# Patient Record
Sex: Female | Born: 1965
Health system: Southern US, Community
[De-identification: ages and names within clinical notes are randomized; demographics above are authoritative.]

## PROBLEM LIST (undated history)

## (undated) DIAGNOSIS — Z9889 Other specified postprocedural states: Secondary | ICD-10-CM

## (undated) DIAGNOSIS — G709 Myoneural disorder, unspecified: Secondary | ICD-10-CM

## (undated) DIAGNOSIS — F32A Depression, unspecified: Secondary | ICD-10-CM

## (undated) DIAGNOSIS — F419 Anxiety disorder, unspecified: Secondary | ICD-10-CM

## (undated) DIAGNOSIS — M1990 Unspecified osteoarthritis, unspecified site: Secondary | ICD-10-CM

## (undated) DIAGNOSIS — E079 Disorder of thyroid, unspecified: Secondary | ICD-10-CM

## (undated) DIAGNOSIS — R928 Other abnormal and inconclusive findings on diagnostic imaging of breast: Secondary | ICD-10-CM

## (undated) DIAGNOSIS — J45909 Unspecified asthma, uncomplicated: Secondary | ICD-10-CM

## (undated) DIAGNOSIS — I8390 Asymptomatic varicose veins of unspecified lower extremity: Secondary | ICD-10-CM

## (undated) DIAGNOSIS — I890 Lymphedema, not elsewhere classified: Secondary | ICD-10-CM

## (undated) DIAGNOSIS — K219 Gastro-esophageal reflux disease without esophagitis: Secondary | ICD-10-CM

## (undated) DIAGNOSIS — G4730 Sleep apnea, unspecified: Secondary | ICD-10-CM

## (undated) DIAGNOSIS — E063 Autoimmune thyroiditis: Secondary | ICD-10-CM

## (undated) DIAGNOSIS — E039 Hypothyroidism, unspecified: Secondary | ICD-10-CM

## (undated) DIAGNOSIS — J189 Pneumonia, unspecified organism: Secondary | ICD-10-CM

## (undated) DIAGNOSIS — R011 Cardiac murmur, unspecified: Secondary | ICD-10-CM

## (undated) DIAGNOSIS — E785 Hyperlipidemia, unspecified: Secondary | ICD-10-CM

## (undated) DIAGNOSIS — R87619 Unspecified abnormal cytological findings in specimens from cervix uteri: Secondary | ICD-10-CM

## (undated) DIAGNOSIS — R112 Nausea with vomiting, unspecified: Secondary | ICD-10-CM

## (undated) HISTORY — DX: Unspecified asthma, uncomplicated: J45.909

## (undated) HISTORY — DX: Gastro-esophageal reflux disease without esophagitis: K21.9

## (undated) HISTORY — DX: Depression, unspecified: F32.A

## (undated) HISTORY — DX: Autoimmune thyroiditis: E06.3

## (undated) HISTORY — DX: Disorder of thyroid, unspecified: E07.9

## (undated) HISTORY — DX: Hyperlipidemia, unspecified: E78.5

## (undated) HISTORY — DX: Sleep apnea, unspecified: G47.30

## (undated) HISTORY — PX: BLADDER SUSPENSION: SHX72

## (undated) HISTORY — DX: Other specified postprocedural states: R11.2

## (undated) HISTORY — DX: Unspecified osteoarthritis, unspecified site: M19.90

## (undated) HISTORY — PX: PARTIAL HYSTERECTOMY: SHX80

## (undated) HISTORY — DX: Unspecified abnormal cytological findings in specimens from cervix uteri: R87.619

## (undated) HISTORY — DX: Other specified postprocedural states: Z98.890

## (undated) HISTORY — DX: Cardiac murmur, unspecified: R01.1

## (undated) HISTORY — PX: SKIN GRAFT: SHX250

## (undated) HISTORY — DX: Anxiety disorder, unspecified: F41.9

## (undated) HISTORY — PX: TUBAL LIGATION: SHX77

## (undated) HISTORY — PX: INCISION AND DRAINAGE OF WOUND: SHX1803

## (undated) HISTORY — PX: CHOLECYSTECTOMY OPEN: SUR202

## (undated) HISTORY — DX: Other abnormal and inconclusive findings on diagnostic imaging of breast: R92.8

## (undated) HISTORY — DX: Myoneural disorder, unspecified: G70.9

## (undated) HISTORY — PX: APPLICATION OF WOUND VAC: SHX5189

## (undated) HISTORY — PX: ABDOMINAL HYSTERECTOMY: SHX81

---

## 2007-05-22 ENCOUNTER — Ambulatory Visit: Payer: Self-pay | Admitting: Family Medicine

## 2007-05-22 DIAGNOSIS — R350 Frequency of micturition: Secondary | ICD-10-CM | POA: Insufficient documentation

## 2007-05-22 DIAGNOSIS — E039 Hypothyroidism, unspecified: Secondary | ICD-10-CM | POA: Insufficient documentation

## 2007-05-22 DIAGNOSIS — M2550 Pain in unspecified joint: Secondary | ICD-10-CM | POA: Insufficient documentation

## 2007-05-22 DIAGNOSIS — R609 Edema, unspecified: Secondary | ICD-10-CM | POA: Insufficient documentation

## 2007-05-26 ENCOUNTER — Encounter: Payer: Self-pay | Admitting: Family Medicine

## 2007-05-26 LAB — CONVERTED CEMR LAB
ALT: 13 units/L (ref 0–35)
AST: 14 units/L (ref 0–37)
Alkaline Phosphatase: 88 units/L (ref 39–117)
CO2: 20 meq/L (ref 19–32)
Creatinine, Ser: 0.88 mg/dL (ref 0.40–1.20)
Pro B Natriuretic peptide (BNP): 27 pg/mL (ref 0.0–100.0)
Sed Rate: 4 mm/hr (ref 0–22)
Sodium: 140 meq/L (ref 135–145)
Total Bilirubin: 0.5 mg/dL (ref 0.3–1.2)
Total Protein: 7.1 g/dL (ref 6.0–8.3)

## 2007-05-27 ENCOUNTER — Telehealth: Payer: Self-pay | Admitting: Family Medicine

## 2007-06-02 ENCOUNTER — Telehealth (INDEPENDENT_AMBULATORY_CARE_PROVIDER_SITE_OTHER): Payer: Self-pay | Admitting: *Deleted

## 2007-08-08 ENCOUNTER — Ambulatory Visit: Payer: Self-pay | Admitting: Family Medicine

## 2007-08-08 DIAGNOSIS — B029 Zoster without complications: Secondary | ICD-10-CM | POA: Insufficient documentation

## 2007-08-12 ENCOUNTER — Encounter: Payer: Self-pay | Admitting: Family Medicine

## 2007-08-12 ENCOUNTER — Telehealth (INDEPENDENT_AMBULATORY_CARE_PROVIDER_SITE_OTHER): Payer: Self-pay | Admitting: *Deleted

## 2007-08-13 ENCOUNTER — Encounter: Payer: Self-pay | Admitting: Family Medicine

## 2007-08-13 LAB — CONVERTED CEMR LAB
TSH: 20.036 microintl units/mL — ABNORMAL HIGH (ref 0.350–5.50)
Varicella IgG: 3.58 — ABNORMAL HIGH

## 2007-08-18 LAB — CONVERTED CEMR LAB: Varicella-Zoster Ab, IgM: <0.91 (ref <0.91)

## 2007-08-19 ENCOUNTER — Encounter: Payer: Self-pay | Admitting: Family Medicine

## 2011-08-22 DIAGNOSIS — Z8719 Personal history of other diseases of the digestive system: Secondary | ICD-10-CM | POA: Insufficient documentation

## 2011-08-22 DIAGNOSIS — F32A Depression, unspecified: Secondary | ICD-10-CM | POA: Insufficient documentation

## 2011-09-20 DIAGNOSIS — J45909 Unspecified asthma, uncomplicated: Secondary | ICD-10-CM | POA: Insufficient documentation

## 2013-08-03 DIAGNOSIS — N393 Stress incontinence (female) (male): Secondary | ICD-10-CM | POA: Insufficient documentation

## 2013-08-03 DIAGNOSIS — IMO0002 Reserved for concepts with insufficient information to code with codable children: Secondary | ICD-10-CM | POA: Insufficient documentation

## 2013-11-06 DIAGNOSIS — N819 Female genital prolapse, unspecified: Secondary | ICD-10-CM | POA: Insufficient documentation

## 2014-11-12 DIAGNOSIS — R600 Localized edema: Secondary | ICD-10-CM | POA: Insufficient documentation

## 2014-11-12 DIAGNOSIS — I872 Venous insufficiency (chronic) (peripheral): Secondary | ICD-10-CM | POA: Insufficient documentation

## 2014-11-12 DIAGNOSIS — I890 Lymphedema, not elsewhere classified: Secondary | ICD-10-CM | POA: Insufficient documentation

## 2014-11-12 DIAGNOSIS — M7989 Other specified soft tissue disorders: Secondary | ICD-10-CM | POA: Insufficient documentation

## 2014-11-12 DIAGNOSIS — Z8249 Family history of ischemic heart disease and other diseases of the circulatory system: Secondary | ICD-10-CM | POA: Insufficient documentation

## 2015-07-26 DIAGNOSIS — M7711 Lateral epicondylitis, right elbow: Secondary | ICD-10-CM | POA: Insufficient documentation

## 2015-08-02 ENCOUNTER — Emergency Department
Admission: EM | Admit: 2015-08-02 | Discharge: 2015-08-02 | Disposition: A | Discharge status: Home / Self Care | Payer: BLUE CROSS/BLUE SHIELD | Source: Home / Self Care | Attending: Family Medicine | Admitting: Family Medicine

## 2015-08-02 ENCOUNTER — Encounter: Payer: Self-pay | Admitting: *Deleted

## 2015-08-02 DIAGNOSIS — I868 Varicose veins of other specified sites: Secondary | ICD-10-CM

## 2015-08-02 DIAGNOSIS — I8390 Asymptomatic varicose veins of unspecified lower extremity: Secondary | ICD-10-CM

## 2015-08-02 DIAGNOSIS — Z5189 Encounter for other specified aftercare: Secondary | ICD-10-CM

## 2015-08-02 DIAGNOSIS — M25571 Pain in right ankle and joints of right foot: Secondary | ICD-10-CM | POA: Insufficient documentation

## 2015-08-02 HISTORY — DX: Asymptomatic varicose veins of unspecified lower extremity: I83.90

## 2015-08-02 HISTORY — DX: Lymphedema, not elsewhere classified: I89.0

## 2015-08-02 NOTE — ED Provider Notes (Signed)
CSN: [id number]     Arrival date & time 08/02/15  1611 History   First MD Initiated Contact with Patient 08/02/15 1618     Chief Complaint  Patient presents with  . Abrasion   (Consider location/radiation/quality/duration/timing/severity/associated sxs/prior Treatment) HPI  Pt is a 50yo female sent to [facility] by her orthopedist for further evaluation and treatment of an abrasion that keeps on bleeding.  Pt states she accidentally nicked a varicose vein while shaving her legs this morning around 11:30AM.  Pt states she applied a pressure bandages but then had to go to a previously scheduled appointment with her orthopedist for Right ankle pain.  During the visit, her orthopedist removed the bandage and blood "squirted" out at the physician.  The nurse then applied a new pressure bandage and pt was told to go to emergency department for evaluation and treatment.  Pt notes she does take  aspirin daily but is not on any other daily medications.  Denies pain at this time.  Past Medical History  Diagnosis Date  . Lymphedema   . Varicose veins    Past Surgical History  Procedure Laterality Date  . Partial hysterectomy    . Incision and drainage of wound    . Skin graft    . Bladder suspension    . Cesarean section    . Cholecystectomy open     Family History  Problem Relation Age of Onset  . Stroke Mother    Social History  Substance Use Topics  . Smoking status: Never Smoker   . Smokeless tobacco: None  . Alcohol Use: No   OB History    No data available     Review of Systems  Musculoskeletal: Negative for myalgias and arthralgias.  Skin: Positive for wound. Negative for color change and rash.    Allergies  Hydrocodone-acetaminophen  Home Medications   Prior to Admission medications   Medication Sig Start Date End Date Taking? Authorizing Provider  citalopram (CELEXA) 40 MG tablet Take 40 mg by mouth daily.   Yes Historical Provider, MD  diclofenac (VOLTAREN) 75 MG EC  tablet Take 75 mg by mouth 2 (two) times daily.   Yes Historical Provider, MD  ferrous sulfate 325 (65 FE) MG tablet Take 325 mg by mouth daily with breakfast.   Yes Historical Provider, MD  gabapentin (NEURONTIN) 300 MG capsule Take 300 mg by mouth 3 (three) times daily.   Yes Historical Provider, MD  levothyroxine (SYNTHROID, LEVOTHROID) 200 MCG tablet Take 200 mcg by mouth daily before breakfast.   Yes Historical Provider, MD  pantoprazole (PROTONIX) 20 MG tablet Take 20 mg by mouth daily.   Yes Historical Provider, MD  tolterodine (DETROL LA) 4 MG 24 hr capsule Take 4 mg by mouth daily.   Yes Historical Provider, MD   Meds Ordered and Administered this Visit  Medications - No data to display  BP 107/73 mmHg  Pulse 70  Temp(Src) 97.8 F (36.6 C) (Oral)  Resp 18  Ht  (1.702 m)  Wt 289 lb (131.09 kg)  BMI 45.25 kg/m2  SpO2 96% No data found.   Physical Exam  Constitutional: She is oriented to person, place, and time. She appears well-developed and well-nourished.  HENT:  Head: Normocephalic and atraumatic.  Eyes: EOM are normal.  Neck: Normal range of motion.  Cardiovascular: Normal rate.   Pulses:      Dorsalis pedis pulses are 2+ on the right side.  Pulmonary/Chest: Effort normal.  Musculoskeletal: Normal range  of motion.  Neurological: She is alert and oriented to person, place, and time.  Skin: Skin is warm and dry. No erythema.  Right ankle: multiple easily visible varicose veins.  Scant red blood on 0.5cm abrasion. No active bleeding. No foreign bodies seen or palpated. No red streaking.  Psychiatric: She has a normal mood and affect. Her behavior is normal.  Nursing note and vitals reviewed.   ED Course  Procedures (including critical care time)  Labs Review Labs Reviewed - No data to display  Imaging Review No results found.    MDM   1. Visit for wound check   2. Varicose veins     Pt advised to go to emergency department for evaluation of  abrasion on Right lower leg that wouldn't stop bleeding.  Pt came to [facility] for evaluation.  No active bleeding on exam, even with gentle manipulation of area during exam.   Reassured pt no additional treatment needed at this time including no indication for sutures, quick clot, or cautery.  Pressure bandage reapplied. Encouraged to keep elevated when she gets home and may apply ice pack. F/u with PCP this week as needed. Patient verbalized understanding and agreement with treatment plan.     Junius Finner, PA-C 08/02/15 1652

## 2015-08-02 NOTE — ED Notes (Signed)
Pt reports that she was shaving her legs this morning when she cut her RT ankle area with the razor. She applied pressure, but the abrasion is continuing to bleed.

## 2019-04-15 DIAGNOSIS — G4733 Obstructive sleep apnea (adult) (pediatric): Secondary | ICD-10-CM | POA: Insufficient documentation

## 2019-04-15 DIAGNOSIS — G4730 Sleep apnea, unspecified: Secondary | ICD-10-CM | POA: Insufficient documentation

## 2020-10-07 ENCOUNTER — Encounter: Payer: Self-pay | Admitting: Medical-Surgical

## 2020-10-07 ENCOUNTER — Ambulatory Visit (INDEPENDENT_AMBULATORY_CARE_PROVIDER_SITE_OTHER): Payer: Medicare Other | Admitting: Medical-Surgical

## 2020-10-07 ENCOUNTER — Other Ambulatory Visit: Payer: Self-pay

## 2020-10-07 VITALS — BP 112/69 | HR 58 | Temp 98.0°F | Ht 64.0 in | Wt 269.7 lb

## 2020-10-07 DIAGNOSIS — G2581 Restless legs syndrome: Secondary | ICD-10-CM | POA: Diagnosis not present

## 2020-10-07 DIAGNOSIS — M2550 Pain in unspecified joint: Secondary | ICD-10-CM | POA: Diagnosis not present

## 2020-10-07 DIAGNOSIS — E559 Vitamin D deficiency, unspecified: Secondary | ICD-10-CM

## 2020-10-07 DIAGNOSIS — E038 Other specified hypothyroidism: Secondary | ICD-10-CM | POA: Diagnosis not present

## 2020-10-07 DIAGNOSIS — Z114 Encounter for screening for human immunodeficiency virus [HIV]: Secondary | ICD-10-CM

## 2020-10-07 DIAGNOSIS — E063 Autoimmune thyroiditis: Secondary | ICD-10-CM

## 2020-10-07 DIAGNOSIS — Z1231 Encounter for screening mammogram for malignant neoplasm of breast: Secondary | ICD-10-CM | POA: Diagnosis not present

## 2020-10-07 DIAGNOSIS — Z1211 Encounter for screening for malignant neoplasm of colon: Secondary | ICD-10-CM | POA: Diagnosis not present

## 2020-10-07 DIAGNOSIS — Z0000 Encounter for general adult medical examination without abnormal findings: Secondary | ICD-10-CM | POA: Diagnosis not present

## 2020-10-07 DIAGNOSIS — Z7689 Persons encountering health services in other specified circumstances: Secondary | ICD-10-CM

## 2020-10-07 DIAGNOSIS — F324 Major depressive disorder, single episode, in partial remission: Secondary | ICD-10-CM

## 2020-10-07 DIAGNOSIS — Z1159 Encounter for screening for other viral diseases: Secondary | ICD-10-CM

## 2020-10-07 DIAGNOSIS — G609 Hereditary and idiopathic neuropathy, unspecified: Secondary | ICD-10-CM

## 2020-10-07 DIAGNOSIS — I890 Lymphedema, not elsewhere classified: Secondary | ICD-10-CM

## 2020-10-07 MED ORDER — GABAPENTIN 100 MG PO CAPS: 100.0000 mg | ORAL_CAPSULE | Freq: Every day | ORAL | 90 refills | Status: DC

## 2020-10-07 MED ORDER — CITALOPRAM HYDROBROMIDE 40 MG PO TABS: 40.0000 mg | ORAL_TABLET | Freq: Every day | ORAL | 3 refills | Status: DC

## 2020-10-07 MED ORDER — GABAPENTIN 300 MG PO CAPS: 300.0000 mg | ORAL_CAPSULE | Freq: Every evening | ORAL | 3 refills | Status: DC

## 2020-10-07 MED ORDER — TRAMADOL HCL 50 MG PO TABS: 50.0000 mg | ORAL_TABLET | Freq: Four times a day (QID) | ORAL | 0 refills | Status: DC | PRN

## 2020-10-07 MED ORDER — ALBUTEROL SULFATE HFA 108 (90 BASE) MCG/ACT IN AERS
2.0000 | INHALATION_SPRAY | RESPIRATORY_TRACT | 11 refills | Status: DC | PRN
Start: 2020-10-07 — End: 2021-07-07

## 2020-10-07 MED ORDER — MELOXICAM 15 MG PO TABS: 15.0000 mg | ORAL_TABLET | Freq: Every day | ORAL | 3 refills | Status: DC

## 2020-10-07 MED ORDER — PANTOPRAZOLE SODIUM 40 MG PO TBEC: 40.0000 mg | DELAYED_RELEASE_TABLET | Freq: Every day | ORAL | 3 refills | Status: DC

## 2020-10-07 NOTE — Progress Notes (Signed)
New Patient Office Visit  Subjective:  Patient ID: Barbara Calderon, female    DOB: 04-21-1966  Age: 55 y.o. MRN: [id number]  CC:  Chief Complaint  Patient presents with  . Establish Care    HPI Barbara Calderon presents to establish care.  Hypothyroidism-history of Hashimoto's thyroiditis, currently treated with levothyroxine 150 mg 6 days/week with one half tab on the seventh day.  Has noted that her nails are getting brittle which is usually the first sign that her thyroid levels are abnormal.  Depression-had depression after her father passed and was started on Celexa 40 mg daily.  She is tolerating this medication well without side effects.  Feels that her symptoms are well controlled.  Restless leg syndrome/peripheral neuropathy-taking gabapentin 100 mg every morning with 300 mg at night.  Reports this is helping her symptoms and they are well managed.  Joint pain-has chronic joint pains of her knees, elbows, and wrist.  Notes that her left arm and right leg are worse than anything else.  Uses gabapentin, Tylenol, and meloxicam.  Takes tramadol very sparingly and only for severe pain.  Admits to taking ibuprofen in conjunction with meloxicam but was never told to avoid taking these together.  Lymphedema-she does have compression socks/stockings but is inconsistent with wearing them at times.  She did leave them off today so that her legs could be evaluated.  She has furosemide 20 mg daily as needed and when she does take it, she takes p.o. potassium with it.  Vitamin D deficiency-taking a daily vitamin D supplement.  Asthma-reports that she has a history of what was determined to be asthma.  She uses an albuterol inhaler as needed, more often when allergies and environmental triggers are prevalent.  Past Medical History:  Diagnosis Date  . Abnormal mammogram   . Abnormal Pap smear of cervix   . Asthma   . Depression   . Lymphedema   . Thyroid disease   . Varicose veins      Past Surgical History:  Procedure Laterality Date  . ABDOMINAL HYSTERECTOMY     partial  . APPLICATION OF WOUND VAC    . BLADDER SUSPENSION    . CESAREAN SECTION    . CHOLECYSTECTOMY OPEN    . INCISION AND DRAINAGE OF WOUND    . PARTIAL HYSTERECTOMY    . SKIN GRAFT    . TUBAL LIGATION      Family History  Problem Relation Age of Onset  . Stroke Mother   . Hypertension Mother   . Prostate cancer Father     Social History   Socioeconomic History  . Marital status: Married    Spouse name: Not on file  . Number of children: Not on file  . Years of education: Not on file  . Highest education level: Not on file  Occupational History  . Not on file  Tobacco Use  . Smoking status: Never Smoker  . Smokeless tobacco: Never Used  Substance and Sexual Activity  . Alcohol use: Yes    Comment: occasionally  . Drug use: No  . Sexual activity: Yes    Partners: Male    Birth control/protection: Surgical  Other Topics Concern  . Not on file  Social History Narrative  . Not on file   Social Determinants of Health   Financial Resource Strain: Not on file  Food Insecurity: Not on file  Transportation Needs: Not on file  Physical Activity: Not on file  Stress: Not on  file  Social Connections: Not on file  Intimate Partner Violence: Not on file    ROS Review of Systems  Constitutional: Positive for unexpected weight change. Negative for chills, fatigue and fever.  HENT: Positive for congestion and hearing loss.   Eyes: Positive for visual disturbance.  Respiratory: Positive for cough. Negative for chest tightness, shortness of breath and wheezing.   Cardiovascular: Positive for palpitations and leg swelling. Negative for chest pain.  Gastrointestinal: Positive for abdominal pain (Heartburn). Negative for constipation, diarrhea, nausea and vomiting.  Endocrine: Positive for cold intolerance, heat intolerance, polydipsia and polyphagia.  Genitourinary:       Leaking  urine  Musculoskeletal: Positive for arthralgias.  Neurological: Positive for weakness and numbness. Negative for dizziness, light-headedness and headaches.  Hematological: Bruises/bleeds easily.  Psychiatric/Behavioral: Positive for dysphoric mood. Negative for self-injury, sleep disturbance and suicidal ideas. The patient is not nervous/anxious.     Objective:   Today's Vitals: BP 112/69   Pulse (!) 58   Temp 98 F (36.7 C)   Ht 5\' 4"  (1.626 m)   Wt 269 lb 11.2 oz (122.3 kg)   SpO2 97%   BMI 46.29 kg/m   Physical Exam Vitals reviewed.  Constitutional:      General: She is not in acute distress.    Appearance: Normal appearance.  HENT:     Head: Normocephalic and atraumatic.  Cardiovascular:     Rate and Rhythm: Normal rate and regular rhythm.     Pulses: Normal pulses.     Heart sounds: Normal heart sounds. No murmur heard. No friction rub. No gallop.   Pulmonary:     Effort: Pulmonary effort is normal. No respiratory distress.     Breath sounds: Normal breath sounds. No wheezing.  Musculoskeletal:     Right lower leg: Edema present.     Left lower leg: Edema present.  Skin:    General: Skin is warm and dry.  Neurological:     Mental Status: She is alert and oriented to person, place, and time.  Psychiatric:        Mood and Affect: Mood normal.        Behavior: Behavior normal.        Thought Content: Thought content normal.        Judgment: Judgment normal.     Assessment & Plan:   1. Encounter to establish care Reviewed available information and discussed healthcare concerns with patient.  2. Hypothyroidism due to Hashimoto's thyroiditis Checking TSH today.  For now continue levothyroxine 150 mg 6 days/week with 75 mg once a day.  Refills will depend on results and potential dose change. - TSH  3. Vitamin D deficiency Checking vitamin D.  Continue daily vitamin D dosing. - Vitamin D 1,25 dihydroxy  4. Lymphedema Strongly recommend wearing compression  socks/stockings on a daily basis.  Okay to use furosemide and potassium chloride on an as-needed basis.  Monitor dietary sodium and fluid intake.  5. Polyarthralgia Continue gabapentin, meloxicam, and Tylenol as prescribed.  Avoid buprenorphine products while taking meloxicam.  Okay to use tramadol 50 mg tablets very sparingly for severe pain.  6. Restless leg syndrome 7. Peripheral neuropathy, idiopathic Well-controlled with gabapentin at current doses.  8. Depression, major, single episode, in partial remission (HCC) Well-controlled.  Continue Celexa 40 mg daily.  9. Preventative health care Checking CBC with differential, CMP, and lipid panel today. - CBC with Differential/Platelet - COMPLETE METABOLIC PANEL WITH GFR - Lipid panel  10. Screen for  colon cancer Referring to GI for colonoscopy. - Ambulatory referral to Gastroenterology  11. Encounter for screening mammogram for malignant neoplasm of breast Mammogram ordered. - MM DIGITAL SCREENING BILATERAL; Future   Outpatient Encounter Medications as of 10/07/2020  Medication Sig  . aspirin EC 81 MG tablet Take 81 mg by mouth daily. Swallow whole.  . Cholecalciferol (VITAMIN D3) 1.25 MG (50000 UT) CAPS Take 1 capsule by mouth daily.  . furosemide (LASIX) 20 MG tablet Take 1 tablet by mouth daily.  . iron polysaccharides (NIFEREX) 150 MG capsule Take 1 capsule by mouth daily.  Marland Kitchen levothyroxine (SYNTHROID) 150 MCG tablet Take 1 tablet by mouth every morning.  . potassium chloride SA (KLOR-CON) 20 MEQ tablet Take 1 tablet by mouth daily as needed. ONLY ON THE DAYS OF TAKING LASIX  . [DISCONTINUED] albuterol (VENTOLIN HFA) 108 (90 Base) MCG/ACT inhaler Inhale 2 puffs into the lungs every 4 (four) hours as needed for wheezing.  . [DISCONTINUED] citalopram (CELEXA) 40 MG tablet Take 1 tablet by mouth daily.  . [DISCONTINUED] gabapentin (NEURONTIN) 100 MG capsule Take 1 capsule by mouth daily.  . [DISCONTINUED] gabapentin (NEURONTIN)  300 MG capsule Take 1 capsule by mouth every evening.  . [DISCONTINUED] meloxicam (MOBIC) 15 MG tablet Take 1 tablet by mouth daily.  . [DISCONTINUED] pantoprazole (PROTONIX) 40 MG tablet Take 1 tablet by mouth daily.  . [DISCONTINUED] traMADol (ULTRAM) 50 MG tablet Take 1 tablet by mouth every 6 (six) hours as needed.  Marland Kitchen albuterol (VENTOLIN HFA) 108 (90 Base) MCG/ACT inhaler Inhale 2 puffs into the lungs every 4 (four) hours as needed for wheezing.  . citalopram (CELEXA) 40 MG tablet Take 1 tablet (40 mg total) by mouth daily.  Marland Kitchen gabapentin (NEURONTIN) 100 MG capsule Take 1 capsule (100 mg total) by mouth daily.  Marland Kitchen gabapentin (NEURONTIN) 300 MG capsule Take 1 capsule (300 mg total) by mouth every evening.  . meloxicam (MOBIC) 15 MG tablet Take 1 tablet (15 mg total) by mouth daily.  . pantoprazole (PROTONIX) 40 MG tablet Take 1 tablet (40 mg total) by mouth daily.  . traMADol (ULTRAM) 50 MG tablet Take 1 tablet (50 mg total) by mouth every 6 (six) hours as needed.  . [DISCONTINUED] citalopram (CELEXA) 40 MG tablet Take 40 mg by mouth daily.  . [DISCONTINUED] diclofenac (VOLTAREN) 75 MG EC tablet Take 75 mg by mouth 2 (two) times daily.  . [DISCONTINUED] ferrous sulfate 325 (65 FE) MG tablet Take 325 mg by mouth daily with breakfast.  . [DISCONTINUED] gabapentin (NEURONTIN) 300 MG capsule Take 300 mg by mouth 3 (three) times daily.  . [DISCONTINUED] levothyroxine (SYNTHROID, LEVOTHROID) 200 MCG tablet Take 200 mcg by mouth daily before breakfast.  . [DISCONTINUED] pantoprazole (PROTONIX) 20 MG tablet Take 20 mg by mouth daily.  . [DISCONTINUED] tolterodine (DETROL LA) 4 MG 24 hr capsule Take 4 mg by mouth daily.   No facility-administered encounter medications on file as of 10/07/2020.    Follow-up: Return in about 6 months (around 04/08/2021) for chronic disease follow up.   Thayer Ohm, DNP, APRN, FNP-BC [facility]

## 2020-10-10 MED ORDER — LEVOTHYROXINE SODIUM 137 MCG PO TABS: 137.0000 ug | ORAL_TABLET | Freq: Every day | ORAL | 1 refills | Status: DC

## 2020-10-10 NOTE — Addendum Note (Signed)
Addended byChristen Butter on: 10/10/2020 05:08 PM   Modules accepted: Orders

## 2020-10-12 LAB — COMPLETE METABOLIC PANEL WITH GFR
AG Ratio: 1.6 (calc) (ref 1.0–2.5)
ALT: 12 U/L (ref 6–29)
AST: 14 U/L (ref 10–35)
Albumin: 4.4 g/dL (ref 3.6–5.1)
Alkaline phosphatase (APISO): 82 U/L (ref 37–153)
BUN: 22 mg/dL (ref 7–25)
CO2: 24 mmol/L (ref 20–32)
Calcium: 9.5 mg/dL (ref 8.6–10.4)
Chloride: 105 mmol/L (ref 98–110)
Creat: 0.78 mg/dL (ref 0.50–1.05)
GFR, Est African American: 99 mL/min/{1.73_m2} (ref >=60)
GFR, Est Non African American: 86 mL/min/{1.73_m2} (ref >=60)
Globulin: 2.7 g/dL (calc) (ref 1.9–3.7)
Glucose, Bld: 92 mg/dL (ref 65–99)
Potassium: 4.2 mmol/L (ref 3.5–5.3)
Sodium: 140 mmol/L (ref 135–146)
Total Bilirubin: 0.6 mg/dL (ref 0.2–1.2)
Total Protein: 7.1 g/dL (ref 6.1–8.1)

## 2020-10-12 LAB — CBC WITH DIFFERENTIAL/PLATELET
Absolute Monocytes: 504 cells/uL (ref 200–950)
Basophils Absolute: 22 cells/uL (ref 0–200)
Basophils Relative: 0.3 %
Eosinophils Absolute: 151 cells/uL (ref 15–500)
Eosinophils Relative: 2.1 %
HCT: 43.2 % (ref 35.0–45.0)
Hemoglobin: 14.4 g/dL (ref 11.7–15.5)
Lymphs Abs: 1728 cells/uL (ref 850–3900)
MCH: 30.3 pg (ref 27.0–33.0)
MCHC: 33.3 g/dL (ref 32.0–36.0)
MCV: 90.9 fL (ref 80.0–100.0)
MPV: 10.1 fL (ref 7.5–12.5)
Monocytes Relative: 7 %
Neutro Abs: 4795 cells/uL (ref 1500–7800)
Neutrophils Relative %: 66.6 %
Platelets: 269 10*3/uL (ref 140–400)
RBC: 4.75 10*6/uL (ref 3.80–5.10)
RDW: 12.8 % (ref 11.0–15.0)
Total Lymphocyte: 24 %
WBC: 7.2 10*3/uL (ref 3.8–10.8)

## 2020-10-12 LAB — LIPID PANEL
Cholesterol: 214 mg/dL — ABNORMAL HIGH (ref <200)
HDL: 69 mg/dL (ref >=50)
LDL Cholesterol (Calc): 130 mg/dL (calc) — ABNORMAL HIGH
Non-HDL Cholesterol (Calc): 145 mg/dL (calc) — ABNORMAL HIGH (ref <130)
Total CHOL/HDL Ratio: 3.1 (calc) (ref <5.0)
Triglycerides: 63 mg/dL (ref <150)

## 2020-10-12 LAB — VITAMIN D 1,25 DIHYDROXY
Vitamin D 1, 25 (OH)2 Total: 40 pg/mL (ref 18–72)
Vitamin D2 1, 25 (OH)2: <8 pg/mL
Vitamin D3 1, 25 (OH)2: 40 pg/mL

## 2020-10-12 LAB — TSH: TSH: 0.12 mIU/L — ABNORMAL LOW

## 2020-11-03 ENCOUNTER — Other Ambulatory Visit: Payer: Self-pay

## 2020-11-03 ENCOUNTER — Ambulatory Visit (INDEPENDENT_AMBULATORY_CARE_PROVIDER_SITE_OTHER): Payer: Medicare Other

## 2020-11-03 DIAGNOSIS — Z1231 Encounter for screening mammogram for malignant neoplasm of breast: Secondary | ICD-10-CM

## 2020-11-03 IMAGING — MG MM DIGITAL SCREENING BILAT W/ TOMO AND CAD
8 series · 8 of 24 positions shown · non-contrast
Comparison: Previous exam(s).

CLINICAL DATA: Screening.

EXAM:
DIGITAL SCREENING BILATERAL MAMMOGRAM WITH TOMOSYNTHESIS AND CAD
TECHNIQUE: Bilateral screening digital craniocaudal and mediolateral oblique
mammograms were obtained. Bilateral screening digital breast
tomosynthesis was performed. The images were evaluated with
computer-aided detection.

[L CC synth-2D]
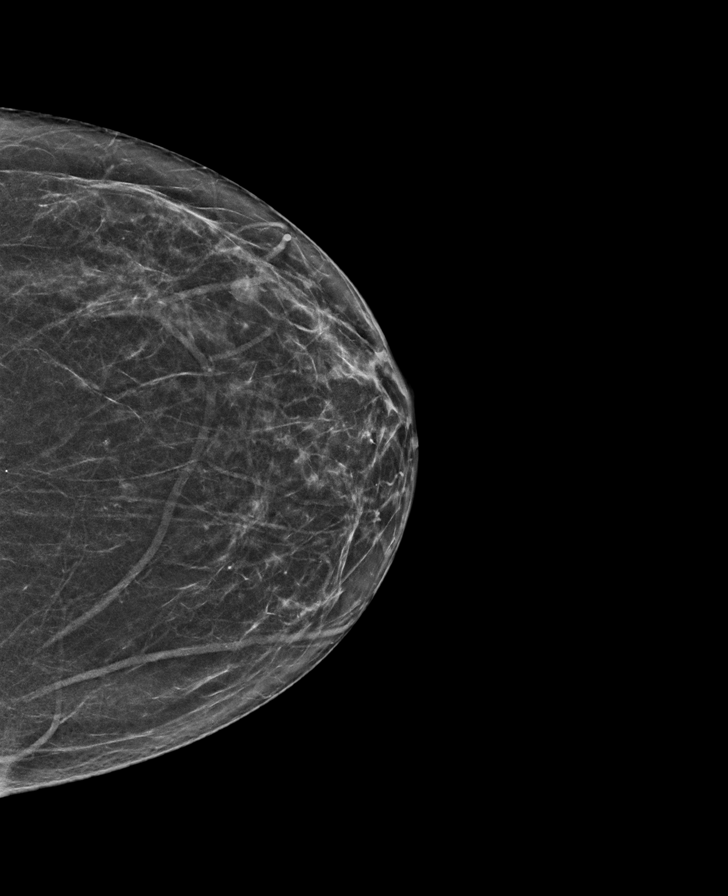

[R CC synth-2D]
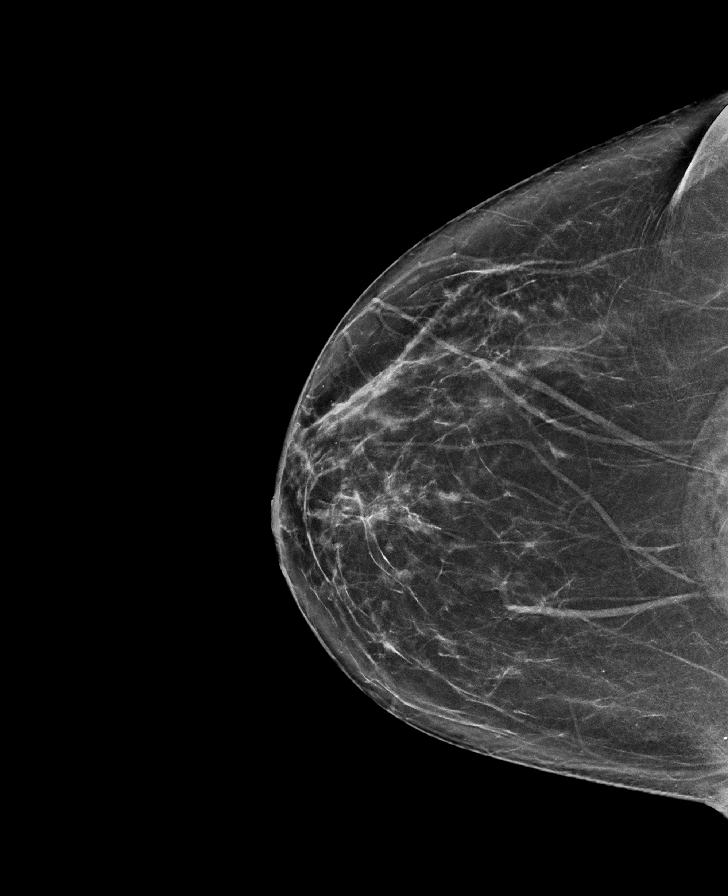

[L MLO synth-2D]
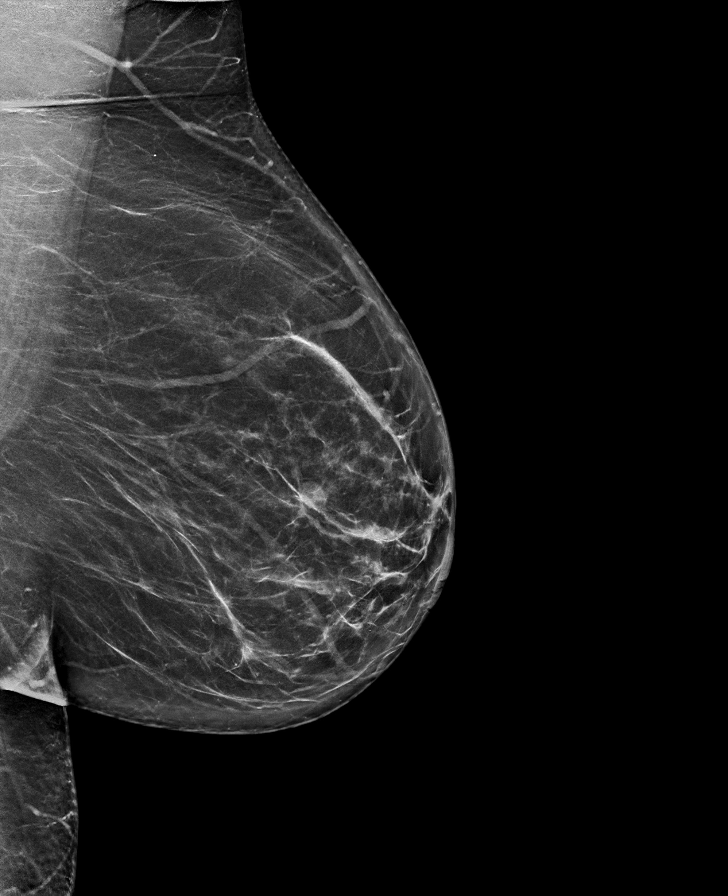

[R MLO synth-2D]
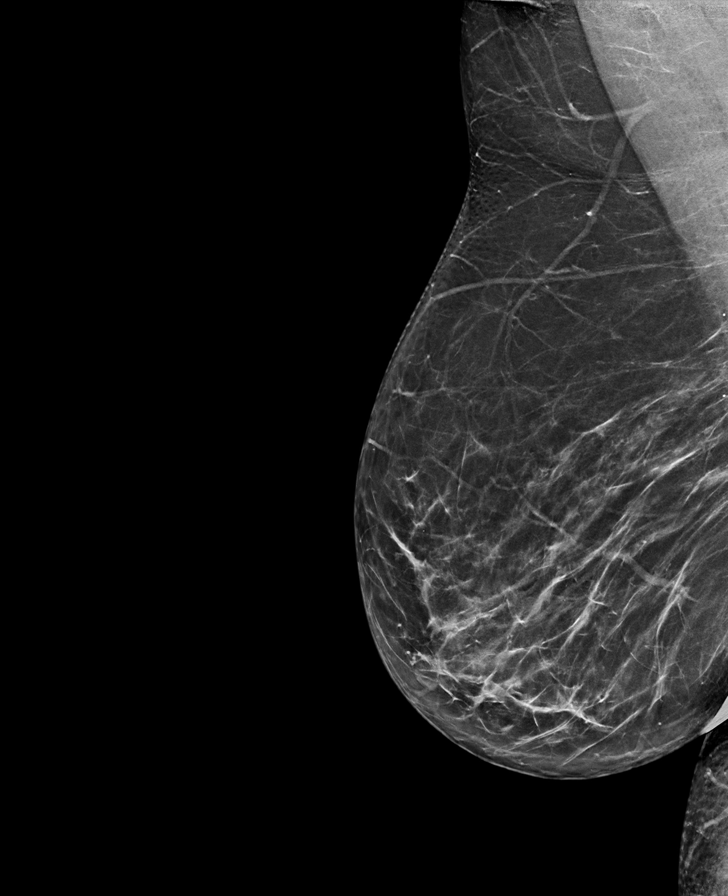

[R CC tomo · tomo slice 35/70.0]
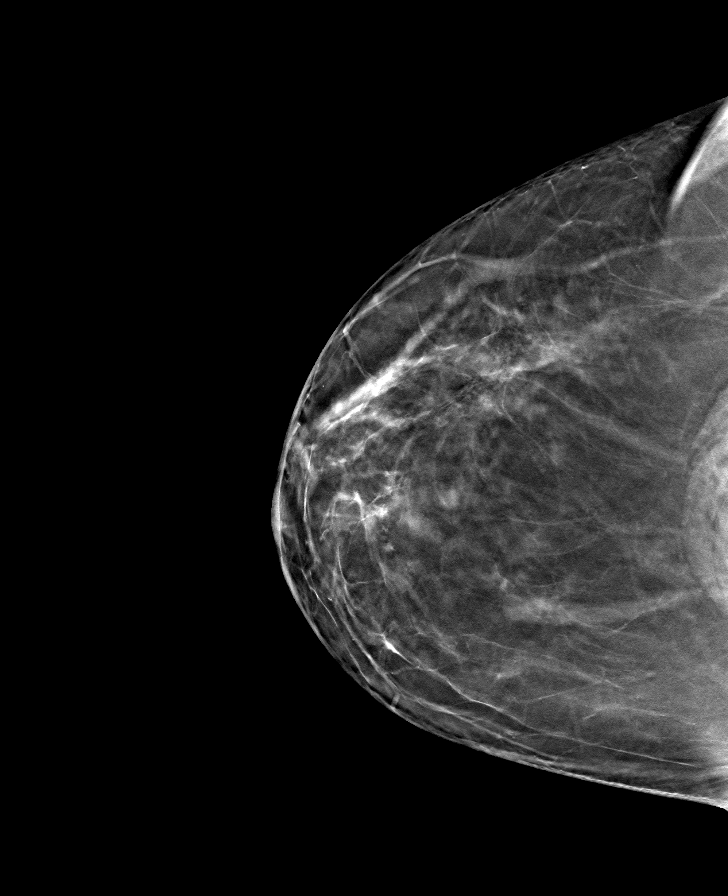

[R MLO tomo · tomo slice 37/74.0]
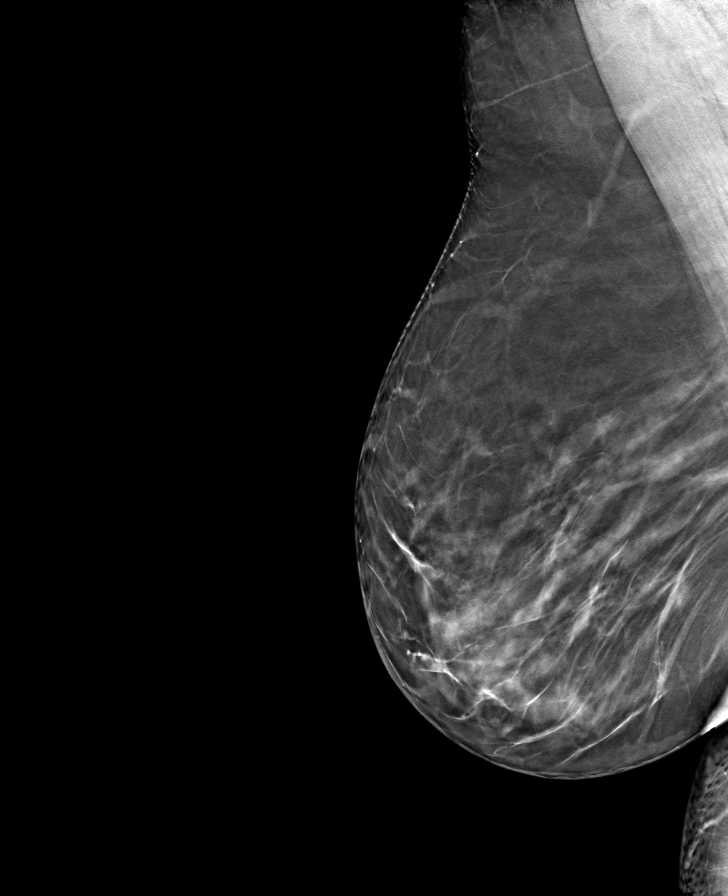

[L CC tomo · tomo slice 31/61.0]
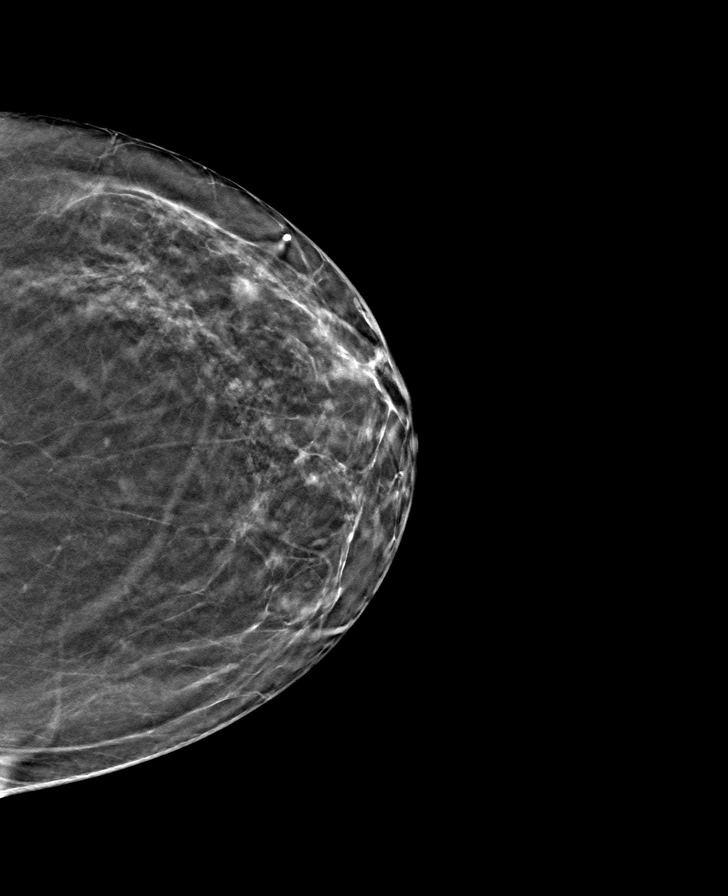

[L MLO tomo · tomo slice 38/75.0]
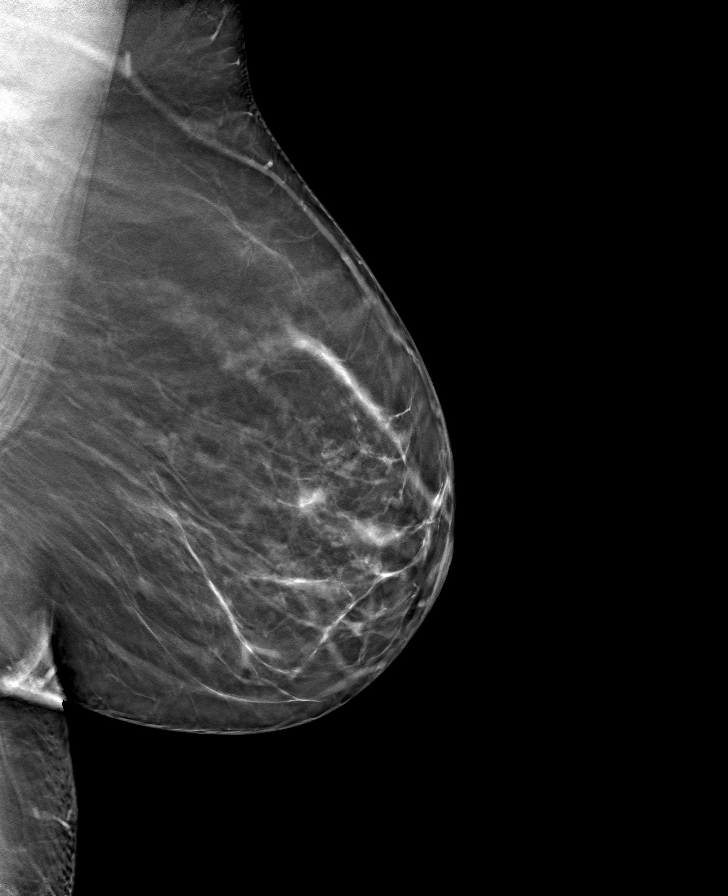

[8 of 24 positions shown; findings below may reference images not displayed]

ACR Breast Density Category b: There are scattered areas of
fibroglandular density.
FINDINGS: There are no findings suspicious for malignancy. The images were
evaluated with computer-aided detection.
IMPRESSION: No mammographic evidence of malignancy. A result letter of this
screening mammogram will be mailed directly to the patient.

RECOMMENDATION:
Screening mammogram in one year. (Code:[OD])

BI-RADS CATEGORY  1: Negative.

## 2020-11-08 ENCOUNTER — Other Ambulatory Visit: Payer: Self-pay

## 2020-11-08 MED ORDER — PANTOPRAZOLE SODIUM 40 MG PO TBEC: 40.0000 mg | DELAYED_RELEASE_TABLET | Freq: Every day | ORAL | 3 refills | Status: DC

## 2020-12-07 ENCOUNTER — Ambulatory Visit (AMBULATORY_SURGERY_CENTER): Payer: Medicare Other | Admitting: *Deleted

## 2020-12-07 ENCOUNTER — Telehealth: Payer: Self-pay | Admitting: Gastroenterology

## 2020-12-07 ENCOUNTER — Other Ambulatory Visit: Payer: Self-pay

## 2020-12-07 VITALS — Ht 64.0 in | Wt 265.0 lb

## 2020-12-07 DIAGNOSIS — Z1211 Encounter for screening for malignant neoplasm of colon: Secondary | ICD-10-CM

## 2020-12-07 MED ORDER — PEG-KCL-NACL-NASULF-NA ASC-C 100 G PO SOLR: 1.0000 | Freq: Once | ORAL | 0 refills | Status: DC

## 2020-12-07 MED ORDER — NA SULFATE-K SULFATE-MG SULF 17.5-3.13-1.6 GM/177ML PO SOLN: ORAL | 0 refills | Status: DC

## 2020-12-07 NOTE — Telephone Encounter (Signed)
Inbound call from pharmacy stating Moviprep is not covered by patient's insurance but Golytely, Clenpiq and Suprep are.  Please advise.

## 2020-12-07 NOTE — Progress Notes (Signed)
Pt's previsit is done over the phone and all paperwork (prep instructions, blank consent form to just read over) sent to patient.  Pt's name and DOB verified at the beginning of the previsit.  Pt denies any difficulty with ambulating.    Pt has hx of PONV, denies being told they were difficult to intubate, or hx/fam hx of malignant hyperthermia per pt   No egg or soy allergy  No home oxygen use   No medications for weight loss taken  emmi information given  Pt denies constipation issues  Pt informed that we do not do prior authorizations for prep

## 2020-12-07 NOTE — Telephone Encounter (Signed)
Called patient, no answer. Left message to let her know that her prep was not covered but suprep was covered-new suprep Rx sent to pharmacy and new prep instructions mailed to patient.

## 2020-12-12 ENCOUNTER — Encounter: Payer: Self-pay | Admitting: Medical-Surgical

## 2020-12-12 ENCOUNTER — Ambulatory Visit (INDEPENDENT_AMBULATORY_CARE_PROVIDER_SITE_OTHER): Payer: Medicare Other | Admitting: Medical-Surgical

## 2020-12-12 ENCOUNTER — Other Ambulatory Visit: Payer: Self-pay

## 2020-12-12 VITALS — BP 102/66 | HR 69 | Temp 97.8°F | Ht 64.0 in | Wt 268.1 lb

## 2020-12-12 DIAGNOSIS — E038 Other specified hypothyroidism: Secondary | ICD-10-CM

## 2020-12-12 DIAGNOSIS — M2550 Pain in unspecified joint: Secondary | ICD-10-CM

## 2020-12-12 DIAGNOSIS — R5383 Other fatigue: Secondary | ICD-10-CM

## 2020-12-12 DIAGNOSIS — E063 Autoimmune thyroiditis: Secondary | ICD-10-CM | POA: Diagnosis not present

## 2020-12-12 MED ORDER — SYNTHROID 137 MCG PO TABS: 137.0000 ug | ORAL_TABLET | Freq: Every day | ORAL | 1 refills | Status: DC

## 2020-12-12 MED ORDER — WEGOVY 0.25 MG/0.5ML ~~LOC~~ SOAJ: 0.2500 mg | SUBCUTANEOUS | 0 refills | Status: DC

## 2020-12-12 NOTE — Progress Notes (Signed)
Subjective:    CC: Fatigue, discuss weight loss  HPI: Pleasant 55 year old female presenting today for the following:  Fatigue-Notes that she has been increasingly fatigued and sluggish with no energy over the last 3 weeks.  She is having difficulty with sleeping well.  She does have occasional chills and endorses night sweats and frequent nocturia.  Has been taking levothyroxine at the lower dose for the last 6 to 8 weeks.  Not sure if this is causing some of her fatigue or not.  No other significant changes noted.  She does have a history of sleep apnea with the most recent sleep study in 2020.  Reports this was considered mild and she was told she did not have to have a CPAP at that time.  She has not had a repeat sleep study and never went to get a CPAP.  Does not feel that she is having issues with anxiety or depression.  No change in typical activities.  She and her husband have been making healthier dietary choices since his diagnosis of diabetes.  She is not exercising but intends to return to regular walking.  Has noted worsening joint aches and pains.  Her right upper arm, right wrist, right shoulder, and right knee all have been bothering her intermittently.  The pain does not seem to be associated with any particular activity or time of day.  She uses over-the-counter Salonpas which helps with her knee but does not do much for her upper extremity symptoms.  She has been evaluated by rheumatology in the past and was not found to have any rheumatological issues.  Is concerned about her weight and would like to discuss weight loss medications.  She did phentermine in the past but notes that it increased her anxiety, jitteriness, and palpitations.  Is interested in other options that may work well for her.  I reviewed the past medical history, family history, social history, surgical history, and allergies today and no changes were needed.  Please see the problem list section below in epic for  further details.  Past Medical History: Past Medical History:  Diagnosis Date  . Abnormal mammogram   . Abnormal Pap smear of cervix   . Anxiety   . Arthritis   . Asthma   . Depression   . Hashimoto's disease   . Heart murmur   . Lymphedema   . Sleep apnea    no CPAP  . Thyroid disease    Hashimotos  . Varicose veins    Past Surgical History: Past Surgical History:  Procedure Laterality Date  . ABDOMINAL HYSTERECTOMY     partial  . APPLICATION OF WOUND VAC    . BLADDER SUSPENSION    . CESAREAN SECTION     x2  . CHOLECYSTECTOMY OPEN    . INCISION AND DRAINAGE OF WOUND    . PARTIAL HYSTERECTOMY    . SKIN GRAFT    . TUBAL LIGATION     Social History: Social History   Socioeconomic History  . Marital status: Married    Spouse name: Not on file  . Number of children: Not on file  . Years of education: Not on file  . Highest education level: Not on file  Occupational History  . Not on file  Tobacco Use  . Smoking status: Never Smoker  . Smokeless tobacco: Never Used  Vaping Use  . Vaping Use: Never used  Substance and Sexual Activity  . Alcohol use: Yes    Comment: occasionally  .  Drug use: No  . Sexual activity: Yes    Partners: Male    Birth control/protection: Surgical  Other Topics Concern  . Not on file  Social History Narrative  . Not on file   Social Determinants of Health   Financial Resource Strain: Not on file  Food Insecurity: Not on file  Transportation Needs: Not on file  Physical Activity: Not on file  Stress: Not on file  Social Connections: Not on file   Family History: Family History  Problem Relation Age of Onset  . Stroke Mother   . Hypertension Mother   . Prostate cancer Father   . Colon cancer Neg Hx   . Esophageal cancer Neg Hx   . Stomach cancer Neg Hx   . Rectal cancer Neg Hx    Allergies: Allergies  Allergen Reactions  . Hydrocodone-Acetaminophen Hives and Itching  . Latex Rash   Medications: See med  rec.  Review of Systems: See HPI for pertinent positives and negatives.   Objective:    General: Well Developed, well nourished, and in no acute distress.  Neuro: Alert and oriented x3.  HEENT: Normocephalic, atraumatic.  Skin: Warm and dry. Cardiac: Regular rate and rhythm, no murmurs rubs or gallops, no lower extremity edema.  Respiratory: Clear to auscultation bilaterally. Not using accessory muscles, speaking in full sentences.  Impression and Recommendations:    1. Hypothyroidism due to Hashimoto's thyroiditis Checking TSH today.  Reports varying TSH over the years.  She has always taken generic levothyroxine.  Switching to brand-name Synthroid 137 mcg daily to see if this will make a difference and help her stay at a more stable level. - TSH - SYNTHROID 137 MCG tablet; Take 1 tablet (137 mcg total) by mouth daily before breakfast.  Dispense: 90 tablet; Refill: 1  2. Fatigue, unspecified type She does have a known history of iron deficiency so checking iron panel today.  Recent CBC, CMP, and lipid panel showed no significant concerns.  Consider possible relation to arthralgias, worsening sleep apnea, change in thyroid medication, or underlying depression. - Fe+TIBC+Fer  3. Arthralgia, unspecified joint Checking ESR and CRP. - Sed Rate (ESR) - C-reactive protein  4. Severe obesity (BMI >= 40) (HCC) Discussed various options for weight loss medications.  Recommend Wegovy as this has shown excellent benefits for weight loss and is usually well-tolerated.  She is comfortable with giving herself shots every week and would like to proceed with prescribing this to see if her insurance will cover it or not.  Return in about 4 weeks (around 01/09/2021) for weight check. ___________________________________________ Clearnce Sorrel, DNP, APRN, FNP-BC [facility] [place]

## 2020-12-13 ENCOUNTER — Encounter: Payer: Self-pay | Admitting: Medical-Surgical

## 2020-12-13 LAB — IRON,TIBC AND FERRITIN PANEL
%SAT: 16 % (calc) (ref 16–45)
Ferritin: 58 ng/mL (ref 16–232)
Iron: 53 ug/dL (ref 45–160)
TIBC: 324 mcg/dL (calc) (ref 250–450)

## 2020-12-13 LAB — TSH: TSH: 0.19 mIU/L — ABNORMAL LOW

## 2020-12-13 LAB — SEDIMENTATION RATE: Sed Rate: 6 mm/h (ref 0–30)

## 2020-12-13 LAB — C-REACTIVE PROTEIN: CRP: 11.2 mg/L — ABNORMAL HIGH (ref <8.0)

## 2020-12-13 MED ORDER — LEVOTHYROXINE SODIUM 125 MCG PO TABS: 125.0000 ug | ORAL_TABLET | Freq: Every day | ORAL | 0 refills | Status: DC

## 2020-12-13 MED ORDER — CELECOXIB 200 MG PO CAPS: ORAL_CAPSULE | ORAL | 2 refills | Status: DC

## 2020-12-13 MED ORDER — CELECOXIB 200 MG PO CAPS: 200.0000 mg | ORAL_CAPSULE | Freq: Two times a day (BID) | ORAL | 2 refills | Status: DC | PRN

## 2020-12-13 NOTE — Addendum Note (Signed)
Addended byChristen Butter on: 12/13/2020 04:36 PM   Modules accepted: Orders

## 2020-12-15 ENCOUNTER — Encounter: Payer: Self-pay | Admitting: Medical-Surgical

## 2020-12-21 ENCOUNTER — Encounter: Payer: BLUE CROSS/BLUE SHIELD | Admitting: Gastroenterology

## 2021-01-07 ENCOUNTER — Encounter: Payer: Self-pay | Admitting: Medical-Surgical

## 2021-01-10 ENCOUNTER — Ambulatory Visit (INDEPENDENT_AMBULATORY_CARE_PROVIDER_SITE_OTHER): Payer: Medicare Other | Admitting: Medical-Surgical

## 2021-01-10 ENCOUNTER — Other Ambulatory Visit: Payer: Self-pay

## 2021-01-10 ENCOUNTER — Ambulatory Visit (INDEPENDENT_AMBULATORY_CARE_PROVIDER_SITE_OTHER): Payer: Medicare Other

## 2021-01-10 ENCOUNTER — Encounter: Payer: Self-pay | Admitting: Medical-Surgical

## 2021-01-10 DIAGNOSIS — M79641 Pain in right hand: Secondary | ICD-10-CM | POA: Diagnosis not present

## 2021-01-10 IMAGING — DX DG HAND COMPLETE 3+V*R*
3 series · 3 of 3 positions shown · non-contrast
Comparison: [DATE] from [REDACTED].

CLINICAL DATA: Fall 4 days ago. Paining fifth metacarpal and third
phalanx.

EXAM:
RIGHT HAND - COMPLETE 3+ VIEW

[hand pa]
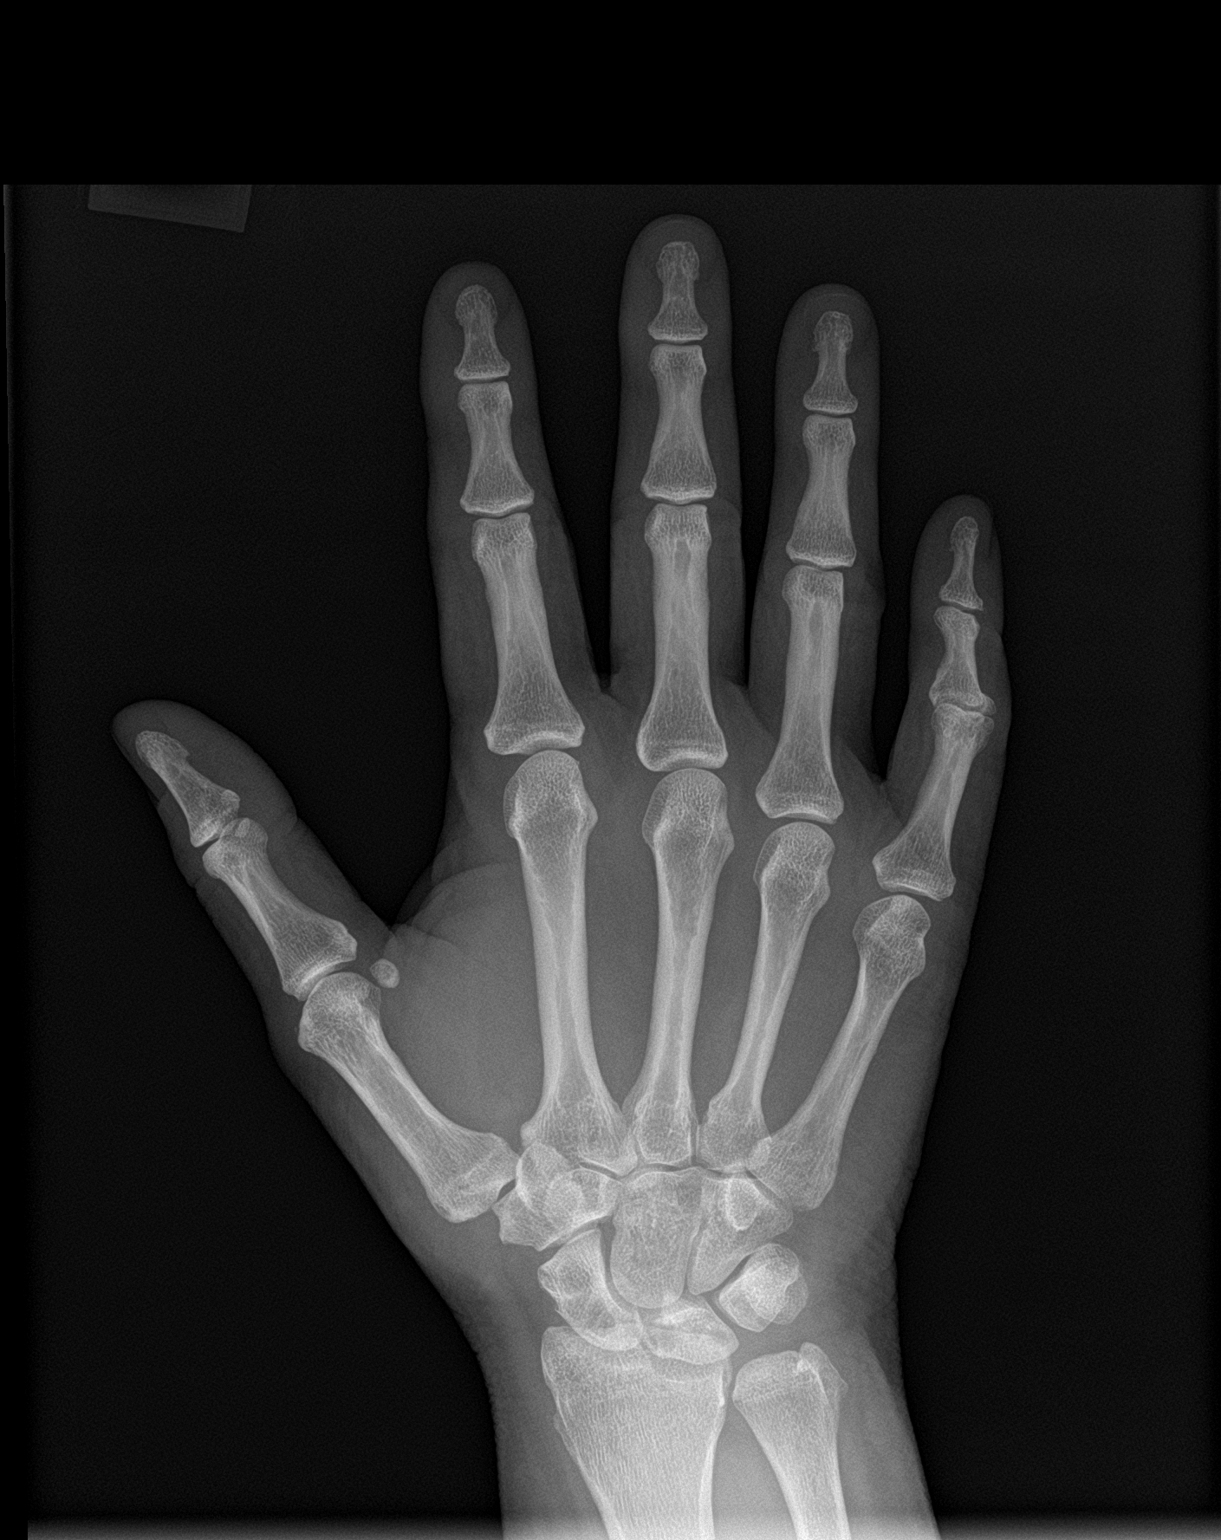

[hand obl]
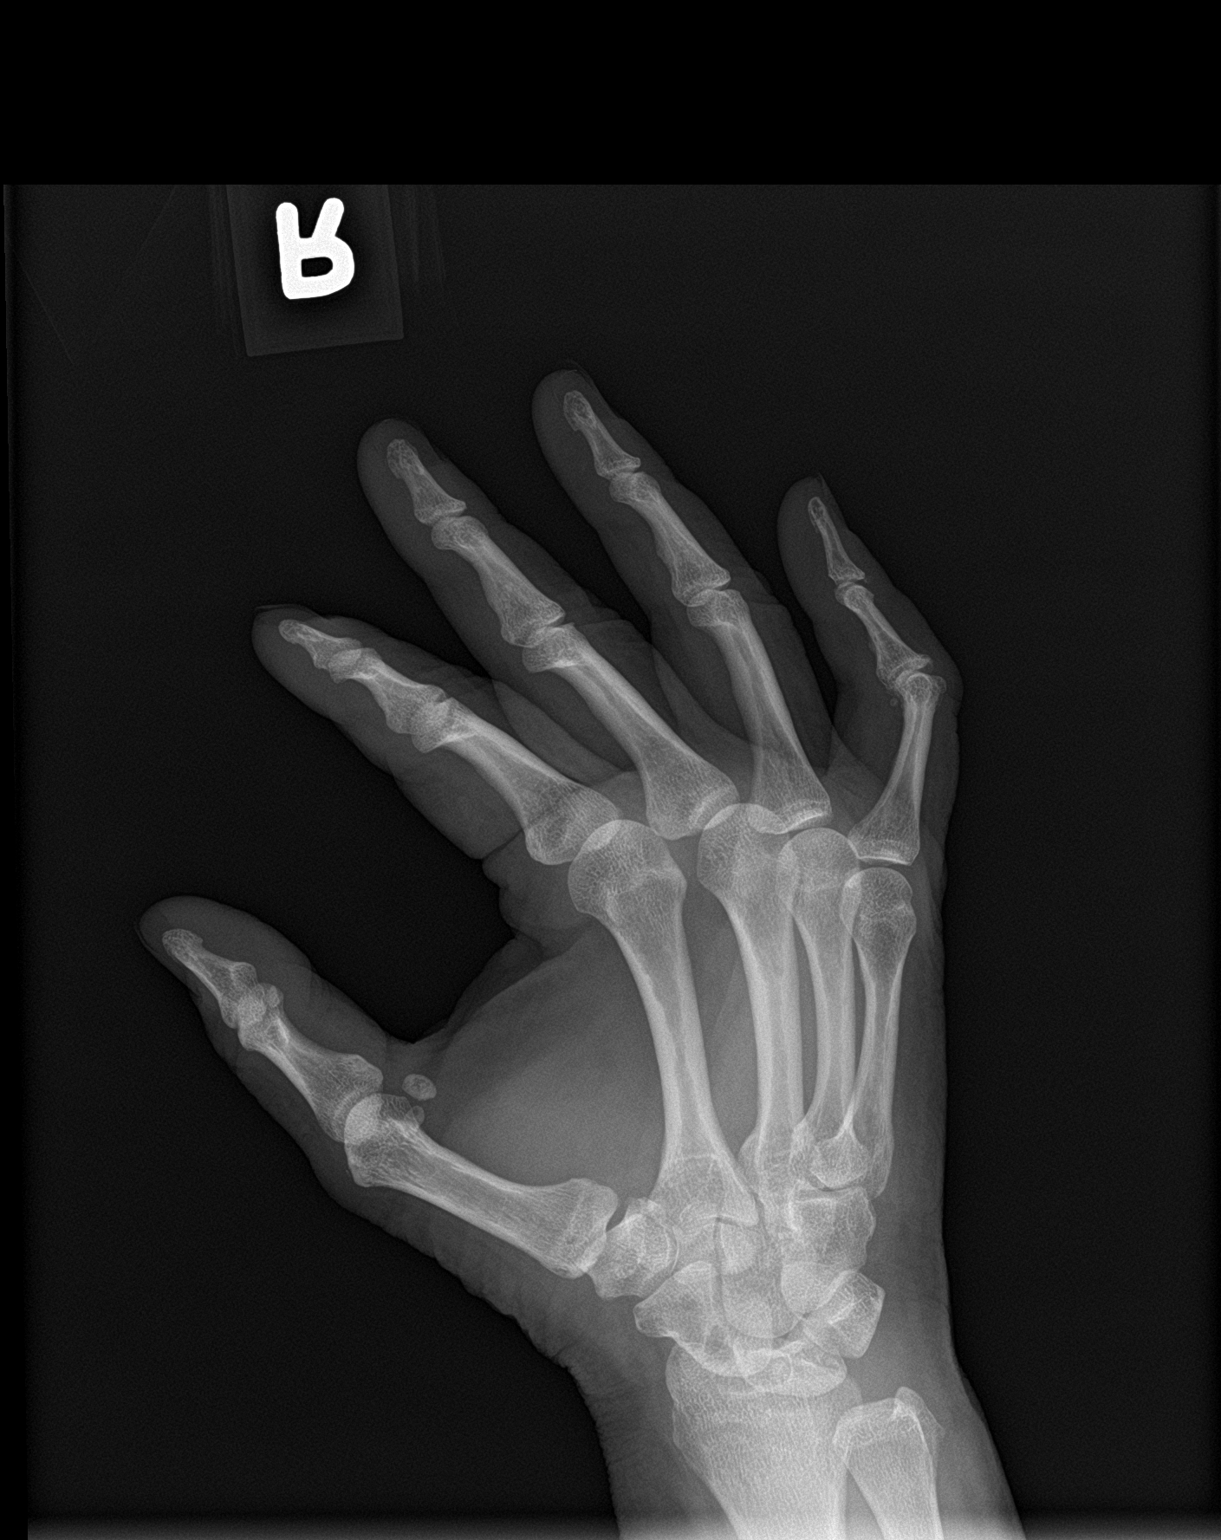

[hand lat]
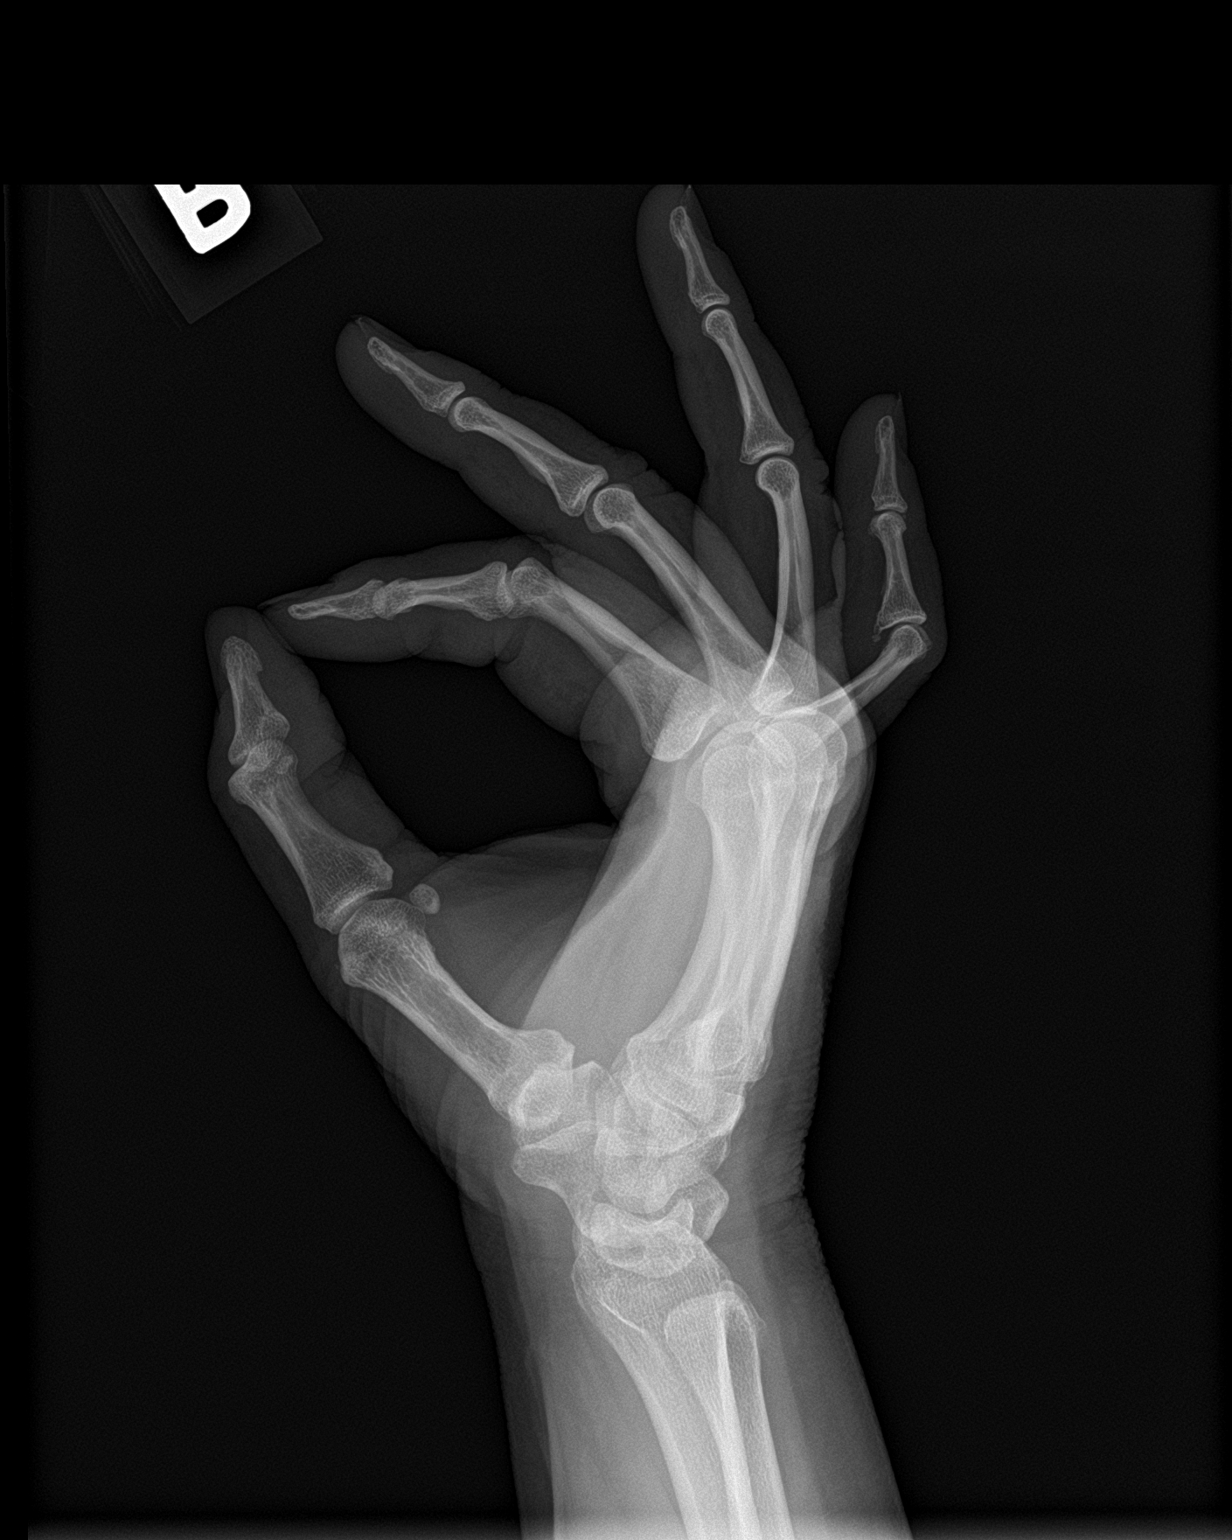

[3 of 3 positions shown; findings below may reference images not displayed]

FINDINGS: No acute fracture or dislocation. No significant soft tissue
swelling.
IMPRESSION: No acute osseous abnormality.

## 2021-01-10 MED ORDER — PHENTERMINE HCL 15 MG PO CAPS: 15.0000 mg | ORAL_CAPSULE | ORAL | 0 refills | Status: DC

## 2021-01-10 NOTE — Progress Notes (Signed)
Established Patient Office Visit  Subjective:  Patient ID: Barbara Calderon, female    DOB: Mar 15, 1966  Age: 55 y.o. MRN: [id number]  CC:  Chief Complaint  Patient presents with   Weight Check    HPI Barbara Calderon presents today for discussion on weight loss measures.  We have prescribed Wegovy at her last visit but have been fighting with insurance to see if they will approve it.  So far weights been denied twice so we will need to move onto the next options.  She does struggle with her weight and has noticed that she continues to gain weight despite lifestyle modifications.  She has been very careful about what she is eating especially now that her husband has diabetes.  She is trying to watch portion sizes and has been physically active, walking mainly.  She did have a fall last Friday and has not been exercising since.  She did use of phentermine in the past and had good results after 7 to 9 months of treatment, losing around 60 pounds.  Her goal weight is at least 175 pounds or less.  Since her fall on Friday, she has had right hand pain long the fifth digit and lateral hand.  She is able to move her pinky somewhat but not fully and the area along her hand and wrist are tender.  Past Medical History:  Diagnosis Date   Abnormal mammogram    Abnormal Pap smear of cervix    Anxiety    Arthritis    Asthma    Depression    Hashimoto's disease    Heart murmur    Lymphedema    Sleep apnea    no CPAP   Thyroid disease    Hashimotos   Varicose veins     Past Surgical History:  Procedure Laterality Date   ABDOMINAL HYSTERECTOMY     partial   APPLICATION OF WOUND VAC     BLADDER SUSPENSION     CESAREAN SECTION     x2   CHOLECYSTECTOMY OPEN     INCISION AND DRAINAGE OF WOUND     PARTIAL HYSTERECTOMY     SKIN GRAFT     TUBAL LIGATION      Family History  Problem Relation Age of Onset   Stroke Mother    Hypertension Mother    Prostate cancer Father    Colon cancer Neg  Hx    Esophageal cancer Neg Hx    Stomach cancer Neg Hx    Rectal cancer Neg Hx     Social History   Socioeconomic History   Marital status: Married    Spouse name: Not on file   Number of children: Not on file   Years of education: Not on file   Highest education level: Not on file  Occupational History   Not on file  Tobacco Use   Smoking status: Never   Smokeless tobacco: Never  Vaping Use   Vaping Use: Never used  Substance and Sexual Activity   Alcohol use: Yes    Comment: occasionally   Drug use: No   Sexual activity: Yes    Partners: Male    Birth control/protection: Surgical  Other Topics Concern   Not on file  Social History Narrative   Not on file   Social Determinants of Health   Financial Resource Strain: Not on file  Food Insecurity: Not on file  Transportation Needs: Not on file  Physical Activity: Not on file  Stress: Not on file  Social Connections: Not on file  Intimate Partner Violence: Not on file    Outpatient Medications Prior to Visit  Medication Sig Dispense Refill   albuterol (VENTOLIN HFA) 108 (90 Base) MCG/ACT inhaler Inhale 2 puffs into the lungs every 4 (four) hours as needed for wheezing. 18 g 11   aspirin EC 81 MG tablet Take 81 mg by mouth daily. Swallow whole.     celecoxib (CELEBREX) 200 MG capsule Take 1 capsule (200 mg total) by mouth 2 (two) times daily as needed. 60 capsule 2   Cholecalciferol (VITAMIN D3) 1.25 MG (50000 UT) CAPS Take 1 capsule by mouth daily.     citalopram (CELEXA) 40 MG tablet Take 1 tablet (40 mg total) by mouth daily. 90 tablet 3   furosemide (LASIX) 20 MG tablet Take 1 tablet by mouth daily. PRN     gabapentin (NEURONTIN) 100 MG capsule Take 1 capsule (100 mg total) by mouth daily. (Patient taking differently: Take 100 mg by mouth daily. In the am) 90 capsule 90   gabapentin (NEURONTIN) 300 MG capsule Take 1 capsule (300 mg total) by mouth every evening. 90 capsule 3   iron polysaccharides (NIFEREX) 150  MG capsule Take 1 capsule by mouth daily.     levothyroxine (SYNTHROID) 125 MCG tablet Take 1 tablet (125 mcg total) by mouth daily before breakfast. 90 tablet 0   Na Sulfate-K Sulfate-Mg Sulf 17.5-3.13-1.6 GM/177ML SOLN Suprep (no substitutions)-TAKE AS DIRECTED. 354 mL 0   pantoprazole (PROTONIX) 40 MG tablet Take 1 tablet (40 mg total) by mouth daily. 90 tablet 3   potassium chloride SA (KLOR-CON) 20 MEQ tablet Take 1 tablet by mouth daily as needed. ONLY ON THE DAYS OF TAKING LASIX     traMADol (ULTRAM) 50 MG tablet Take 1 tablet (50 mg total) by mouth every 6 (six) hours as needed. 60 tablet 0   Semaglutide-Weight Management (WEGOVY) 0.25 MG/0.5ML SOAJ Inject 0.25 mg into the skin once a week. (Patient not taking: Reported on 01/10/2021) 2 mL 0   No facility-administered medications prior to visit.    Allergies  Allergen Reactions   Hydrocodone-Acetaminophen Hives and Itching   Latex Rash    ROS Review of Systems    Objective:    Physical Exam  BP 109/69   Pulse 69   Temp 98.4 F (36.9 C)   Ht 5\' 4"  (1.626 m)   Wt 275 lb 1.6 oz (124.8 kg)   SpO2 95%   BMI 47.22 kg/m  Wt Readings from Last 3 Encounters:  01/10/21 275 lb 1.6 oz (124.8 kg)  12/12/20 268 lb 1.6 oz (121.6 kg)  12/07/20 265 lb (120.2 kg)     Health Maintenance Due  Topic Date Due   COLONOSCOPY (Pts 45-65yrs Insurance coverage will need to be confirmed)  Never done    There are no preventive care reminders to display for this patient.  Lab Results  Component Value Date   TSH 0.19 (L) 12/12/2020   Lab Results  Component Value Date   WBC 7.2 10/07/2020   HGB 14.4 10/07/2020   HCT 43.2 10/07/2020   MCV 90.9 10/07/2020   PLT 269 10/07/2020   Lab Results  Component Value Date   NA 140 10/07/2020   K 4.2 10/07/2020   CO2 24 10/07/2020   GLUCOSE 92 10/07/2020   BUN 22 10/07/2020   CREATININE 0.78 10/07/2020   BILITOT 0.6 10/07/2020   ALKPHOS 88 05/26/2007   AST 14 10/07/2020  ALT 12  10/07/2020   PROT 7.1 10/07/2020   ALBUMIN 4.4 05/26/2007   CALCIUM 9.5 10/07/2020   Lab Results  Component Value Date   CHOL 214 (H) 10/07/2020   Lab Results  Component Value Date   HDL 69 10/07/2020   Lab Results  Component Value Date   LDLCALC 130 (H) 10/07/2020   Lab Results  Component Value Date   TRIG 63 10/07/2020   Lab Results  Component Value Date   CHOLHDL 3.1 10/07/2020   No results found for: HGBA1C    Assessment & Plan:   1. Severe obesity (BMI >= 40) (HCC) POCT hemoglobin A1c 5.6% indicating no prediabetes or diabetes.  Discussed options for weight management that are available in primary care.  Since she had results with phentermine before, we will go ahead and restart phentermine at 15 mg daily.  If poor results after 1 to 2 months, we will consider increasing to 37.5 mg daily.  After that, we may consider adding a small dose of Topamax to aid in appetite suppression.  Discussed recommended caloric restrictions as well as activity guidelines.  She is currently only doing walking so recommend she start doing some muscle building exercises.  2. Right hand pain Getting an x-ray of her right hand today.  Managed with conservative measures including Tylenol/ibuprofen, heat, and ice. - DG Hand Complete Right; Future    Meds ordered this encounter  Medications   phentermine 15 MG capsule    Sig: Take 1 capsule (15 mg total) by mouth every morning.    Dispense:  30 capsule    Refill:  0    Order Specific Question:   Supervising Provider    Answer:   Sunnie Nielsen [[id number]]    Follow-up: Return in about 4 weeks (around 02/07/2021) for weight check.   Thayer Ohm, DNP, APRN, FNP-BC [facility]

## 2021-01-23 ENCOUNTER — Other Ambulatory Visit: Payer: Self-pay | Admitting: Medical-Surgical

## 2021-02-08 ENCOUNTER — Encounter: Payer: Self-pay | Admitting: Medical-Surgical

## 2021-02-08 ENCOUNTER — Ambulatory Visit (INDEPENDENT_AMBULATORY_CARE_PROVIDER_SITE_OTHER): Payer: Medicare Other | Admitting: Medical-Surgical

## 2021-02-08 VITALS — BP 125/84 | HR 64 | Resp 20 | Wt 269.0 lb

## 2021-02-08 DIAGNOSIS — M25561 Pain in right knee: Secondary | ICD-10-CM | POA: Diagnosis not present

## 2021-02-08 DIAGNOSIS — Z7689 Persons encountering health services in other specified circumstances: Secondary | ICD-10-CM | POA: Diagnosis not present

## 2021-02-08 MED ORDER — DICLOFENAC SODIUM 75 MG PO TBEC: 75.0000 mg | DELAYED_RELEASE_TABLET | Freq: Two times a day (BID) | ORAL | 0 refills | Status: DC

## 2021-02-08 MED ORDER — PHENTERMINE HCL 37.5 MG PO CAPS: 37.5000 mg | ORAL_CAPSULE | ORAL | 0 refills | Status: DC

## 2021-02-08 NOTE — Progress Notes (Signed)
  HPI with pertinent ROS:   CC: weight check  HPI: Pleasant 55 year old female presenting today for weight check on phentermine 15mg  daily.  She has been taking the medication as prescribed and notes that she had a couple of days where she felt a bit jittery but this passed quickly.  She has noted a small decrease in her appetite and has been drinking lots more water due to increased thirst.  She has been logging her food in my fitness pal but admits to being a bit slack over the last few days.  She does continue doing regular walking but has not incorporated resistance or strength training at this point.  Her biggest issue is that she is very busy and often has to eat on the go.  She does have trouble with keeping snacks in the house because they are very tempting when they are just sitting in the cabinet.  Continues to have significant joint pain, especially in her right knee.  Known history of arthritis after x-rays several years ago.  She does take gabapentin 300 mg at bedtime and 100 mg in the morning.  If her pain is really bad or she is having issues with restless legs, she will take an extra 100 mg dose in the afternoon as needed.  She has not tried taking 200 mg so far because the 300 mg causes excess sedation.  She is taking Celebrex 200 mg twice daily as prescribed but is not sure that it is working very well.  I reviewed the past medical history, family history, social history, surgical history, and allergies today and no changes were needed.  Please see the problem list section below in epic for further details.   Physical exam:   General: Well Developed, well nourished, and in no acute distress.  Neuro: Alert and oriented x3.  HEENT: Normocephalic, atraumatic.  Skin: Warm and dry. Cardiac: Regular rate and rhythm, no murmurs rubs or gallops, no lower extremity edema.  Respiratory: Clear to auscultation bilaterally. Not using accessory muscles, speaking in full  sentences.  Impression and Recommendations:    1. Encounter for weight management 2. Severe obesity (BMI >= 40) (HCC) Another 6 pounds lost on phentermine 15 mg daily.  She is not seeing significant appetite reduction and would like to go ahead and increase this to the full dose.  Sending in phentermine 37.5 mg daily.  If she does not see more benefit at the higher dose, we discussed adding a small dose of Topamax on a daily basis in the next month or 2.  Strongly recommend adding in strength exercises and/or resistance training to help build muscle.  Continue increased activity and dietary modifications.  Work on planning meals and healthy snacks so that the temptation to grab unhealthy ones is not so strong.  3. Arthralgia of right knee Consider increasing a.m. dose of gabapentin to 200 mg.  This may cause some initial sleepiness but that should wear off after several days.  This may help with her pains throughout the day.  Can also incorporate the afternoon dose more regularly over the next couple of weeks.  She has tried meloxicam before and found that ineffective.  Switching to Voltaren 75 mg twice daily.  Return in about 4 weeks (around 03/08/2021) for weight check. ___________________________________________ 03/10/2021, DNP, APRN, FNP-BC Primary Care and Sports Medicine [facility] [place]

## 2021-02-23 ENCOUNTER — Other Ambulatory Visit: Payer: Self-pay | Admitting: Medical-Surgical

## 2021-02-28 ENCOUNTER — Other Ambulatory Visit: Payer: Self-pay | Admitting: Medical-Surgical

## 2021-02-28 NOTE — Telephone Encounter (Signed)
Ok for refill if appropriate, pt has f/u appt on 9/2.

## 2021-03-10 ENCOUNTER — Other Ambulatory Visit: Payer: Self-pay

## 2021-03-10 ENCOUNTER — Ambulatory Visit (INDEPENDENT_AMBULATORY_CARE_PROVIDER_SITE_OTHER): Payer: Medicare Other | Admitting: Medical-Surgical

## 2021-03-10 ENCOUNTER — Other Ambulatory Visit: Payer: Self-pay | Admitting: Medical-Surgical

## 2021-03-10 ENCOUNTER — Encounter: Payer: Self-pay | Admitting: Medical-Surgical

## 2021-03-10 VITALS — BP 104/72 | HR 66 | Wt 265.0 lb

## 2021-03-10 DIAGNOSIS — M25561 Pain in right knee: Secondary | ICD-10-CM

## 2021-03-10 DIAGNOSIS — Z01419 Encounter for gynecological examination (general) (routine) without abnormal findings: Secondary | ICD-10-CM

## 2021-03-10 DIAGNOSIS — Z23 Encounter for immunization: Secondary | ICD-10-CM | POA: Diagnosis not present

## 2021-03-10 DIAGNOSIS — Z7689 Persons encountering health services in other specified circumstances: Secondary | ICD-10-CM | POA: Diagnosis not present

## 2021-03-10 MED ORDER — TOPIRAMATE 25 MG PO TABS: 25.0000 mg | ORAL_TABLET | Freq: Two times a day (BID) | ORAL | 0 refills | Status: DC

## 2021-03-10 MED ORDER — OXYBUTYNIN CHLORIDE ER 5 MG PO TB24: 5.0000 mg | ORAL_TABLET | Freq: Every day | ORAL | 0 refills | Status: DC

## 2021-03-10 MED ORDER — PHENTERMINE HCL 37.5 MG PO CAPS: 37.5000 mg | ORAL_CAPSULE | ORAL | 0 refills | Status: DC

## 2021-03-10 NOTE — Progress Notes (Signed)
  HPI with pertinent ROS:   CC: weight check  HPI: Pleasant 55 year old female presenting today for weight check and joint pain follow-up.  Weight check-taking phentermine 37.5 mg daily, tolerating well although she has noted that she has a bit more anxiety and irritability than usual.  She does endorse a decreased appetite and often does not feel hungry for breakfast and lunch.  There are times she only eats a piece of fruit for breakfast.  By suppertime, she feels very hungry and eats larger portions.  She has been walking some for exercise but her chronic joint pains are a considerable issue.  She is not sleeping well overall due to frequent nocturia.  She is not currently taking any medication for this but is interested in trying something.  Joint pains-she does have chronic joint pain and is currently medicated with gabapentin and as needed Ultram.  She is trying to get some exercise and working on weight loss but the extra walking is causing her to have worsened knee pains.  She is wearing compression stockings but has not tried a neoprene sleeve or knee brace.  I reviewed the past medical history, family history, social history, surgical history, and allergies today and no changes were needed.  Please see the problem list section below in epic for further details.   Physical exam:   General: Well Developed, well nourished, and in no acute distress.  Neuro: Alert and oriented x3, extra-ocular muscles intact, sensation grossly intact.  HEENT: Normocephalic, atraumatic, pupils equal round reactive to light, neck supple, no masses, no lymphadenopathy, thyroid nonpalpable.  Skin: Warm and dry, no rashes. Cardiac: Regular rate and rhythm, no murmurs rubs or gallops, no lower extremity edema.  Respiratory: Clear to auscultation bilaterally. Not using accessory muscles, speaking in full sentences.  Impression and Recommendations:    1. Encounter for weight management 2. Severe obesity (BMI  >= 40) (HCC) 4 pound weight loss in the past 4 weeks.  She is a bit discouraged because she did not lose more.  Advised adding a protein for breakfast and aiming to get protein with every meal.  Consider protein shakes if unable to eat a full meal.  Continue regular exercise and work to incorporate more weight training.  Adding Topamax 25 mg daily.  3. Encounter for well woman exam with routine gynecological exam Referring to gynecology per patient request. - Ambulatory referral to Gynecology  4.  Arthralgia of right knee Recommend trying neoprene sleeves or knee braces for exercise activities.  Also recommend low impact activity such as swimming, stationary biking, etc.  Continue Voltaren as prescribed.  5. Need for influenza vaccination Flu vaccine given in office today. - Flu Vaccine QUAD 20mo+IM (Fluarix, Fluzone & Alfiuria Quad PF)  Return in about 4 weeks (around 04/07/2021) for weight check. ___________________________________________ Thayer Ohm, DNP, APRN, FNP-BC Primary Care and Sports Medicine [facility] [place]

## 2021-03-16 ENCOUNTER — Encounter: Payer: Self-pay | Admitting: Gastroenterology

## 2021-03-25 ENCOUNTER — Other Ambulatory Visit: Payer: Self-pay | Admitting: Medical-Surgical

## 2021-03-27 NOTE — Telephone Encounter (Signed)
Last OV for weight/joint pain 03/10/21, follow up scheduled for 04/11/21. Last Tramadol refill 03/01/21 Oxybutin and topamax last refills on 03/10/21

## 2021-03-29 ENCOUNTER — Other Ambulatory Visit: Payer: Self-pay | Admitting: Medical-Surgical

## 2021-03-29 ENCOUNTER — Encounter: Payer: Self-pay | Admitting: Gastroenterology

## 2021-04-01 LAB — HM HEPATITIS C SCREENING LAB: HM Hepatitis Screen: NEGATIVE

## 2021-04-11 ENCOUNTER — Ambulatory Visit: Payer: BLUE CROSS/BLUE SHIELD | Admitting: Medical-Surgical

## 2021-04-11 NOTE — Progress Notes (Deleted)
  HPI with pertinent ROS:   CC:   HPI:   I reviewed the past medical history, family history, social history, surgical history, and allergies today and no changes were needed.  Please see the problem list section below in epic for further details.   Physical exam:   General: Well Developed, well nourished, and in no acute distress.  Neuro: Alert and oriented x3, extra-ocular muscles intact, sensation grossly intact.  HEENT: Normocephalic, atraumatic, pupils equal round reactive to light, neck supple, no masses, no lymphadenopathy, thyroid nonpalpable.  Skin: Warm and dry, no rashes. Cardiac: Regular rate and rhythm, no murmurs rubs or gallops, no lower extremity edema.  Respiratory: Clear to auscultation bilaterally. Not using accessory muscles, speaking in full sentences.  Impression and Recommendations:    There are no diagnoses linked to this encounter.  No follow-ups on file. ___________________________________________ Elleana Stillson L. Maleni Seyer, DNP, APRN, FNP-BC Primary Care and Sports Medicine [facility]  

## 2021-04-25 ENCOUNTER — Other Ambulatory Visit: Payer: Self-pay | Admitting: Medical-Surgical

## 2021-05-04 NOTE — Progress Notes (Signed)
  HPI with pertinent ROS:   CC: Weight Check   HPI:  Pleasant 55 year old female presenting today for weight check.  She was previously taking phentermine 37.5 mg daily along with Topamax 25 mg twice daily.  Tolerating both medications well without side effects.  Unfortunately, she contracted COVID in event of missing her follow-up appointment for last month.  She has been working on controlling her appetite, making healthier choices, and eating smaller portions.  Has not been exercising as regularly because she is having difficulty with her lower extremities.  Today reports that she continues to have post-COVID symptoms and has significant sinus drainage, nasal congestion, and a nonproductive cough.  Having difficulty with headaches and facial pain even though she is almost a month past COVID.  Also notes that she continues to have worsening fatigue, balance issues, and vision changes.  Has had tightness in her feet and ankles that is reminiscent of the early part of a cramp but is not as painful.  It is still uncomfortable but not unbearably so.  Has numbness and tingling in her extremities intermittently.  Notes occasional upper extremity tremors as well as chronic joint pains.  Has some difficulties with vertigo and sexual dysfunction.  Spoke with a friend of hers who suggested she may want to get evaluated for multiple sclerosis as she has quite a few of the signs and symptoms.  I reviewed the past medical history, family history, social history, surgical history, and allergies today and no changes were needed.  Please see the problem list section below in epic for further details.   Physical exam:   General: Well Developed, well nourished, and in no acute distress.  Neuro: Alert and oriented x3, extra-ocular muscles intact, sensation grossly intact.  HEENT: Normocephalic, atraumatic, pupils equal round reactive to light, neck supple, no masses, no lymphadenopathy, thyroid nonpalpable.  Maxillary sinus tenderness.  Skin: Warm and dry. Cardiac: Regular rate and rhythm, no murmurs rubs or gallops, no lower extremity edema.  Respiratory: Clear to auscultation bilaterally. Not using accessory muscles, speaking in full sentences.  Impression and Recommendations:    1. Encounter for weight management 2. Severe obesity (BMI >= 40) (HCC) Resume phentermine 37.5mg  daily and continue topamax 25mg  bid. Still lost 4lbs even without the medication being refilled so continue diet and lifestyle modifications.   3. Balance problem 4. Ataxia 5. Vision changes 6. Dizziness 7. Peripheral polyneuropathy Multiple presenting signs and symptoms that could be related to MS but also may be related to chronic illnesses and obesity. Getting MRI brain for further evaluation.  - MR Brain W Wo Contrast; Future  8. Acute non-recurrent maxillary sinusitis Azithromycin sent to pharmacy. Continue conservative measures at home.   Return in about 4 weeks (around 06/02/2021) for weight check. ___________________________________________ 06/04/2021, DNP, APRN, FNP-BC Primary Care and Sports Medicine [facility] [place]

## 2021-05-05 ENCOUNTER — Ambulatory Visit (INDEPENDENT_AMBULATORY_CARE_PROVIDER_SITE_OTHER): Payer: Medicare Other | Admitting: Medical-Surgical

## 2021-05-05 ENCOUNTER — Encounter: Payer: Self-pay | Admitting: Medical-Surgical

## 2021-05-05 VITALS — BP 108/73 | HR 64 | Resp 20 | Ht 64.0 in | Wt 261.8 lb

## 2021-05-05 DIAGNOSIS — H539 Unspecified visual disturbance: Secondary | ICD-10-CM

## 2021-05-05 DIAGNOSIS — R270 Ataxia, unspecified: Secondary | ICD-10-CM

## 2021-05-05 DIAGNOSIS — G629 Polyneuropathy, unspecified: Secondary | ICD-10-CM | POA: Diagnosis not present

## 2021-05-05 DIAGNOSIS — R2689 Other abnormalities of gait and mobility: Secondary | ICD-10-CM

## 2021-05-05 DIAGNOSIS — Z7689 Persons encountering health services in other specified circumstances: Secondary | ICD-10-CM | POA: Diagnosis not present

## 2021-05-05 DIAGNOSIS — R42 Dizziness and giddiness: Secondary | ICD-10-CM

## 2021-05-05 DIAGNOSIS — J0100 Acute maxillary sinusitis, unspecified: Secondary | ICD-10-CM | POA: Diagnosis not present

## 2021-05-05 MED ORDER — AZITHROMYCIN 250 MG PO TABS: ORAL_TABLET | ORAL | 0 refills | Status: AC

## 2021-05-05 MED ORDER — PHENTERMINE HCL 37.5 MG PO CAPS: 37.5000 mg | ORAL_CAPSULE | ORAL | 0 refills | Status: DC

## 2021-05-09 ENCOUNTER — Encounter: Payer: Self-pay | Admitting: Gastroenterology

## 2021-05-09 ENCOUNTER — Telehealth: Payer: Self-pay | Admitting: Gastroenterology

## 2021-05-09 ENCOUNTER — Encounter: Payer: Self-pay | Admitting: Medical-Surgical

## 2021-05-09 NOTE — Telephone Encounter (Signed)
Patient called requesting to reschedule again due to having an upper respiratory infection and is not able to come in for procedure on 05/12/21 she did reschedule for 07/21/21.

## 2021-05-09 NOTE — Telephone Encounter (Signed)
Okay thanks for letting me know

## 2021-05-10 MED ORDER — METHYLPREDNISOLONE 4 MG PO TBPK: ORAL_TABLET | ORAL | 0 refills | Status: DC

## 2021-05-10 MED ORDER — GUAIFENESIN-CODEINE 100-10 MG/5ML PO SOLN: 5.0000 mL | Freq: Three times a day (TID) | ORAL | 0 refills | Status: DC | PRN

## 2021-05-12 ENCOUNTER — Encounter: Payer: Medicare Other | Admitting: Gastroenterology

## 2021-05-15 ENCOUNTER — Ambulatory Visit (INDEPENDENT_AMBULATORY_CARE_PROVIDER_SITE_OTHER): Payer: Medicare Other

## 2021-05-15 ENCOUNTER — Other Ambulatory Visit: Payer: Self-pay

## 2021-05-15 DIAGNOSIS — H539 Unspecified visual disturbance: Secondary | ICD-10-CM

## 2021-05-15 DIAGNOSIS — R270 Ataxia, unspecified: Secondary | ICD-10-CM

## 2021-05-15 DIAGNOSIS — R531 Weakness: Secondary | ICD-10-CM | POA: Diagnosis not present

## 2021-05-15 DIAGNOSIS — R2689 Other abnormalities of gait and mobility: Secondary | ICD-10-CM

## 2021-05-15 DIAGNOSIS — H9190 Unspecified hearing loss, unspecified ear: Secondary | ICD-10-CM | POA: Diagnosis not present

## 2021-05-15 DIAGNOSIS — G629 Polyneuropathy, unspecified: Secondary | ICD-10-CM

## 2021-05-15 DIAGNOSIS — R42 Dizziness and giddiness: Secondary | ICD-10-CM

## 2021-05-15 IMAGING — MR MR HEAD WO/W CM
13 series · 48 of 48 positions shown · IV contrast (gadavist)
Comparison: None.

CLINICAL DATA: Dizziness. Ataxia. Question demyelinating disease.
Weakness. Vision loss. Hearing loss. Balance disturbance.

EXAM:
MRI HEAD WITHOUT AND WITH CONTRAST
TECHNIQUE: Multiplanar, multiecho pulse sequences of the brain and surrounding
structures were obtained without and with intravenous contrast.
CONTRAST:  10mL GADAVIST GADOBUTROL 1 MMOL/ML IV SOLN

[Series 5: DWI · axial · 3.0mm · 1.46mm/px · z∈[-65,+96]mm · 7 of 110 slices shown (1 of 4)]
[im 1/110]
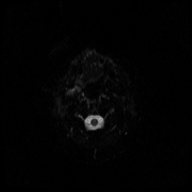
[im 19/110]
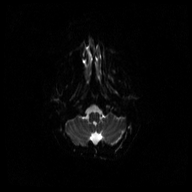
[im 37/110]
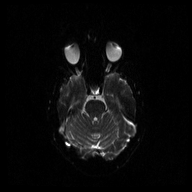
[im 55/110]
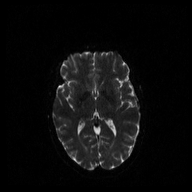
[im 73/110]
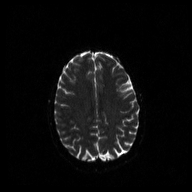
[im 91/110]
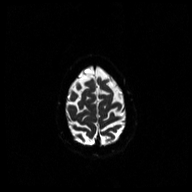
[im 110/110]
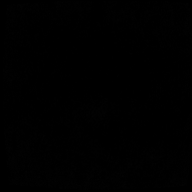

[Series 6: DWI · axial · 3.0mm · 1.46mm/px · z∈[-65,+96]mm · 3 of 55 slices shown (2 of 4)]
[im 1/55]
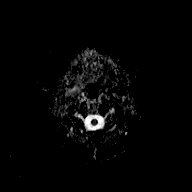
[im 28/55]
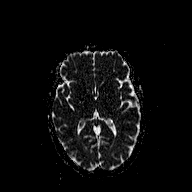
[im 55/55]
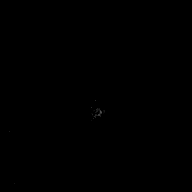

[Series 7: DWI · coronal · 5.0mm · 1.46mm/px · 4 of 63 slices shown (3 of 4)]
[im 1/63]
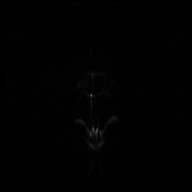
[im 21/63]
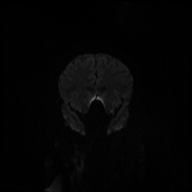
[im 42/63]
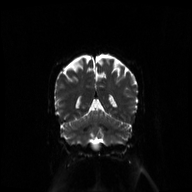
[im 63/63]
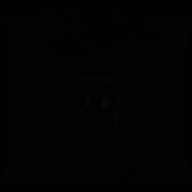

[Series 8: DWI · coronal · 5.0mm · 1.46mm/px · 2 of 32 slices shown (4 of 4)]
[im 1/32]
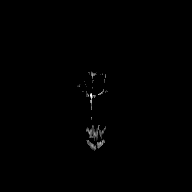
[im 32/32]
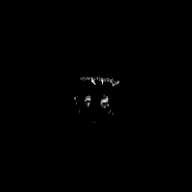

[Series 9: T1 · sagittal · 5.0mm · 0.60mm/px · 1 of 23 slices shown (1 of 2)]
[im 1/23]
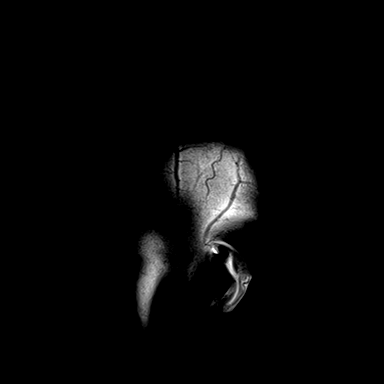

[Series 10: T2 · axial · 5.0mm · 0.57mm/px · 1 of 23 slices shown (1 of 2)]
[im 1/23]
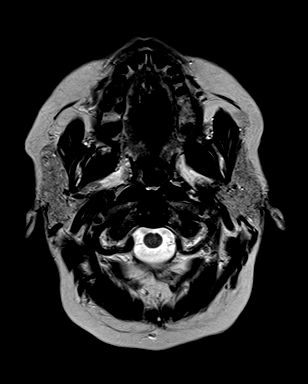

[Series 11: FLAIR · axial · 3.0mm · 0.43mm/px · z∈[-50,+112]mm · 3 of 55 slices shown (1 of 2)]
[im 1/55]
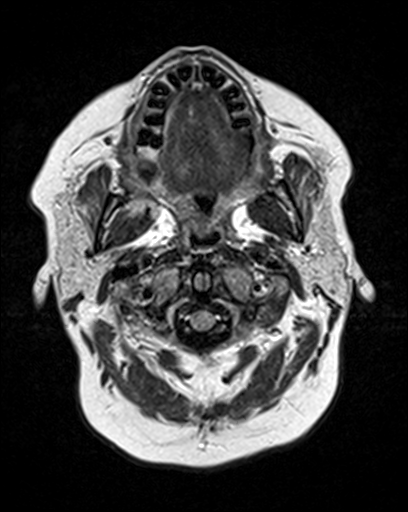
[im 28/55]
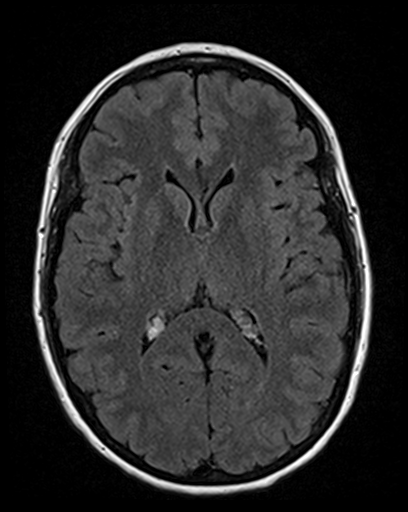
[im 55/55]
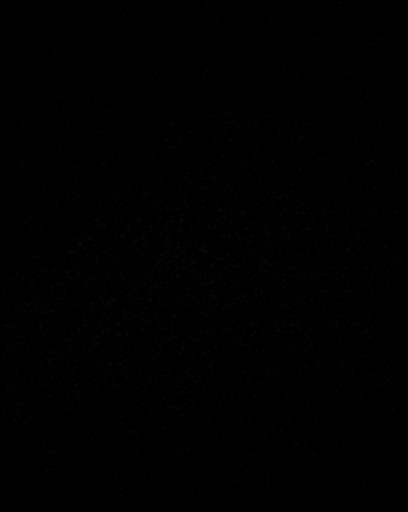

[Series 12: T2 · axial · 5.0mm · 0.69mm/px · 1 of 23 slices shown (2 of 2)]
[im 1/23]
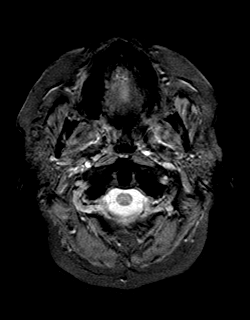

[Series 13: T1 · axial · 1.0mm · 0.69mm/px · z∈[-48,+111]mm · 10 of 160 slices shown (2 of 2)]
[im 1/160]
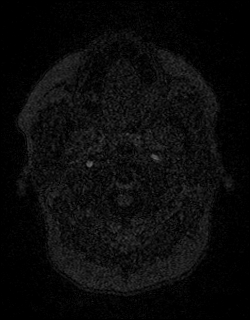
[im 18/160]
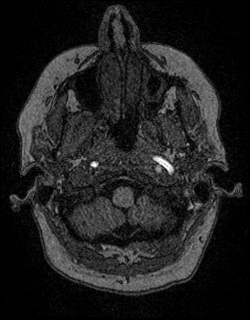
[im 36/160]
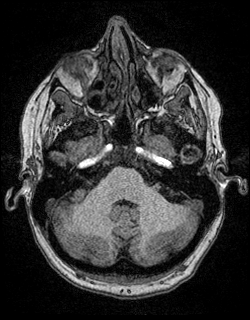
[im 54/160]
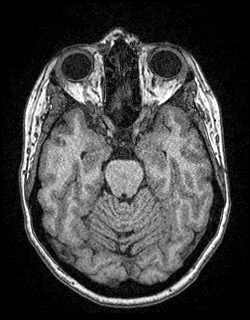
[im 71/160]
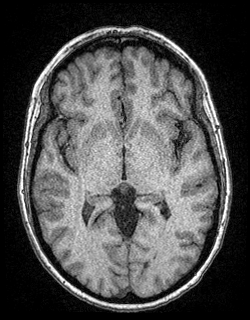
[im 89/160]
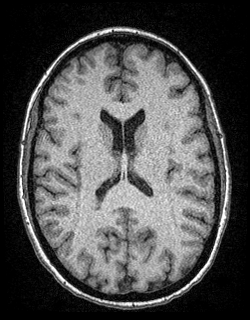
[im 107/160]
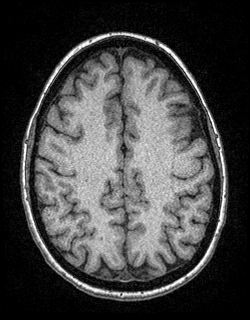
[im 124/160]
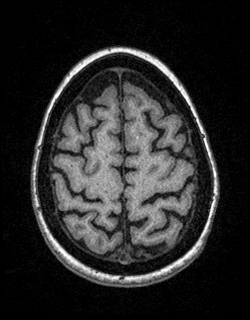
[im 142/160]
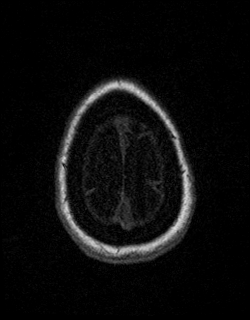
[im 160/160]
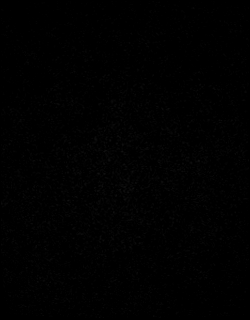

[Series 14: FLAIR · sagittal · 3.0mm · 0.72mm/px · 2 of 37 slices shown (2 of 2)]
[im 1/37]
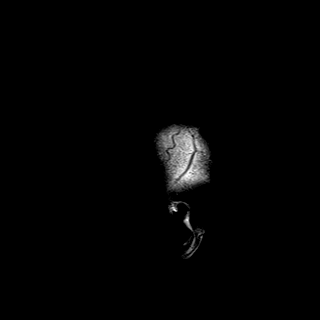
[im 37/37]
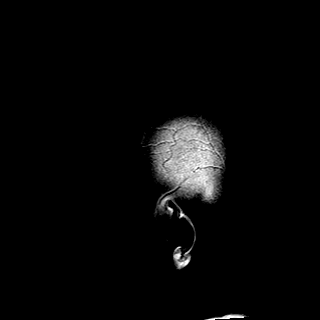

[Series 15: T2 post-contrast · coronal · 5.0mm · 0.69mm/px · 2 of 30 slices shown]
[im 1/30]
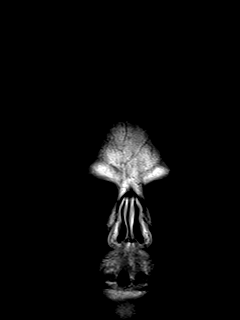
[im 30/30]
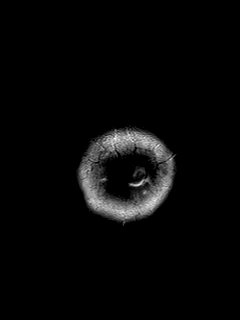

[Series 16: T1 post-contrast · axial · 1.0mm · 0.69mm/px · z∈[-48,+111]mm · 10 of 160 slices shown (1 of 2)]
[im 1/160]
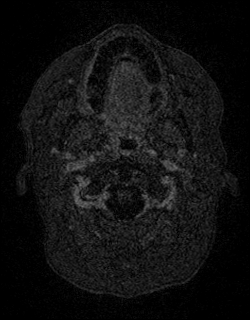
[im 18/160]
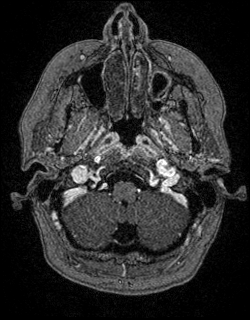
[im 36/160]
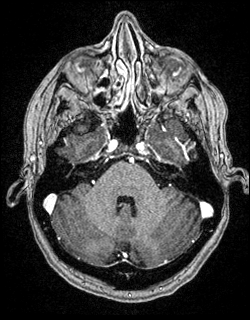
[im 54/160]
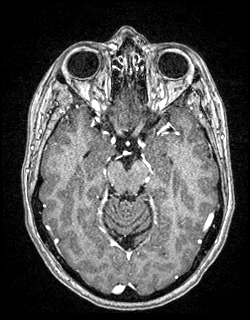
[im 71/160]
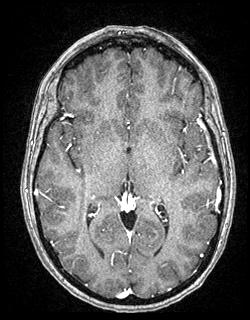
[im 89/160]
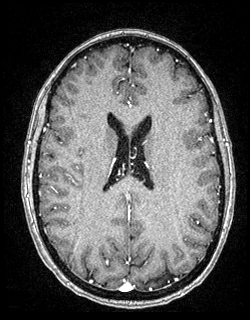
[im 107/160]
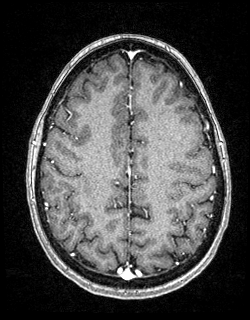
[im 124/160]
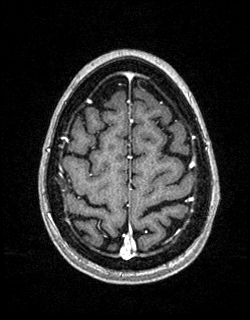
[im 142/160]
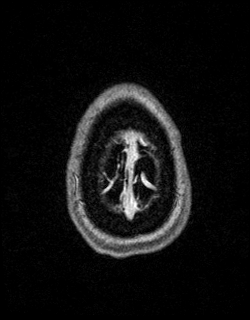
[im 160/160]
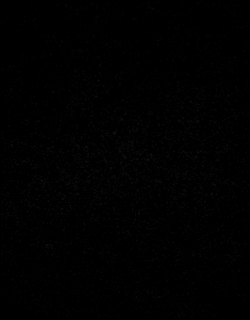

[Series 17: T1 post-contrast · coronal · 5.0mm · 0.69mm/px · 2 of 30 slices shown (2 of 2)]
[im 1/30]
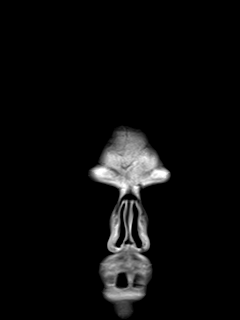
[im 30/30]
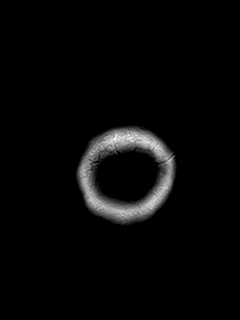

[48 of 48 positions shown; findings below may reference images not displayed]

FINDINGS: Brain: Diffusion imaging does not show any acute or subacute
infarction or other cause of restricted diffusion. No abnormality is
seen affecting the brainstem or cerebellum. The right cerebral
hemispheres normal. In the left hemisphere, in the medial left
posterior parietal cortical and subcortical brain, there is a
subcentimeter focus of increased T2 and FLAIR signal without
contrast enhancement, mass effect or blood products. The appearance
is not typical of demyelinating disease or stroke. This could be a
small congenital dysplasia or hamartoma. Because of the
benign/indolent features, follow-up study could be performed in 3-6
months to ensure stability. I doubt this will be clinically
relevant.

Vascular: Major vessels at the base of the brain show flow. Venous
sinuses appear patent.

Skull and upper cervical spine: Normal.

Sinuses/Orbits: Clear/normal.

Other: None significant.
IMPRESSION: No acute or reversible finding. No evidence of demyelinating
disease.

Subcentimeter focus of T2 and FLAIR signal within the medial left
posterior parietal cortex. Favored diagnosis is small dysplasia or
hamartoma. No abnormal enhancement, mass effect, edema or
hemorrhage. I think this is unlikely to be clinically significant.
However, stability should be established. Initial follow-up exam
suggested in 3-6 months.

## 2021-05-15 MED ORDER — GADOBUTROL 1 MMOL/ML IV SOLN
10.0000 mL | Freq: Once | INTRAVENOUS | Status: AC | PRN
  Administered 2021-05-15: 10 mL via INTRAVENOUS

## 2021-05-18 ENCOUNTER — Encounter: Payer: Self-pay | Admitting: Medical-Surgical

## 2021-05-18 DIAGNOSIS — R270 Ataxia, unspecified: Secondary | ICD-10-CM

## 2021-05-18 DIAGNOSIS — H539 Unspecified visual disturbance: Secondary | ICD-10-CM

## 2021-05-18 DIAGNOSIS — R42 Dizziness and giddiness: Secondary | ICD-10-CM

## 2021-05-18 DIAGNOSIS — R2689 Other abnormalities of gait and mobility: Secondary | ICD-10-CM

## 2021-05-26 ENCOUNTER — Other Ambulatory Visit: Payer: Self-pay | Admitting: Medical-Surgical

## 2021-06-08 ENCOUNTER — Encounter: Payer: Self-pay | Admitting: Medical-Surgical

## 2021-06-08 ENCOUNTER — Ambulatory Visit (INDEPENDENT_AMBULATORY_CARE_PROVIDER_SITE_OTHER): Payer: Medicare Other | Admitting: Medical-Surgical

## 2021-06-08 ENCOUNTER — Other Ambulatory Visit: Payer: Self-pay

## 2021-06-08 VITALS — BP 123/84 | HR 68 | Resp 20 | Ht 64.0 in | Wt 253.3 lb

## 2021-06-08 DIAGNOSIS — Z7689 Persons encountering health services in other specified circumstances: Secondary | ICD-10-CM

## 2021-06-08 MED ORDER — PHENTERMINE HCL 37.5 MG PO CAPS: 37.5000 mg | ORAL_CAPSULE | ORAL | 0 refills | Status: DC

## 2021-06-08 MED ORDER — TOPIRAMATE 25 MG PO TABS: 25.0000 mg | ORAL_TABLET | Freq: Two times a day (BID) | ORAL | 1 refills | Status: DC

## 2021-06-08 NOTE — Progress Notes (Signed)
  HPI with pertinent ROS:   CC: Weight check  HPI: Pleasant 55 year old female presenting today for weight check on phentermine.  She has been doing extremely well on phentermine 37.5 mg with the addition of Topamax 25 mg daily.  Tolerating both medications without side effects.  Notes a continued decreased appetite.  She is making healthier choices and limiting her intake of starches and concentrated sweets.  She is trying to stay as active as possible however her balance issues and joint pains do impact this.  Denies chest pain, shortness of breath, palpitations, increased anxiety, and GI issues.  I reviewed the past medical history, family history, social history, surgical history, and allergies today and no changes were needed.  Please see the problem list section below in epic for further details.   Physical exam:   General: Well Developed, well nourished, and in no acute distress.  Neuro: Alert and oriented x3.  HEENT: Normocephalic, atraumatic.  Skin: Warm and dry. Cardiac: Regular rate and rhythm, no murmurs rubs or gallops, no lower extremity edema.  Respiratory: Clear to auscultation bilaterally. Not using accessory muscles, speaking in full sentences.  Impression and Recommendations:    1. Encounter for weight management Has lost another 8 pounds in the last 4 weeks.  Is doing extremely well.  Continue phentermine 37.5 mg daily.  We will do up to another 2 months of this before discussing tapering off.  Continue Topamax.  Refills sent to pharmacy as requested.  Continue dietary modifications and work on maintaining increased exercise/activity.  Return in about 4 weeks (around 07/06/2021) for Nurse visit for weight/blood pressure check. ___________________________________________ Thayer Ohm, DNP, APRN, FNP-BC Primary Care and Sports Medicine [facility]

## 2021-06-26 ENCOUNTER — Other Ambulatory Visit: Payer: Self-pay | Admitting: Medical-Surgical

## 2021-06-30 DIAGNOSIS — N3000 Acute cystitis without hematuria: Secondary | ICD-10-CM | POA: Diagnosis not present

## 2021-07-07 ENCOUNTER — Other Ambulatory Visit: Payer: Self-pay

## 2021-07-07 ENCOUNTER — Ambulatory Visit (AMBULATORY_SURGERY_CENTER): Payer: Medicare Other | Admitting: *Deleted

## 2021-07-07 ENCOUNTER — Other Ambulatory Visit: Payer: Self-pay | Admitting: Medical-Surgical

## 2021-07-07 VITALS — Ht 65.0 in | Wt 252.4 lb

## 2021-07-07 DIAGNOSIS — Z1211 Encounter for screening for malignant neoplasm of colon: Secondary | ICD-10-CM

## 2021-07-07 NOTE — Progress Notes (Signed)
PV completed over the phone. Pt verified name, DOB, address and insurance during PV today.  Pt mailed instruction packet with copy of consent form to read and not return, and instructions.   Pt encouraged to call with questions or issues.  If pt has My chart, procedure instructions sent via My Chart   No egg or soy allergy known to patient  issues known to pt with past sedation with any surgeries or procedures of PONV  Patient denies ever being told they had issues or difficulty with intubation  No FH of Malignant Hyperthermia Pt is on diet pills Phentermine - should be off by the time of the Colon 1-13  Pt is not on  home 02  Pt is not on blood thinners  Pt denies issues with constipation  Pt has a hx of A fib  years ago- no anticoag   Pt is fully vaccinated  for Covid   Due to the COVID-19 pandemic we are asking patients to follow certain guidelines in PV and the [facility]   Pt aware of COVID protocols and [facility] guidelines

## 2021-07-11 ENCOUNTER — Ambulatory Visit: Payer: Medicare Other

## 2021-07-17 ENCOUNTER — Other Ambulatory Visit: Payer: Self-pay

## 2021-07-17 ENCOUNTER — Ambulatory Visit (INDEPENDENT_AMBULATORY_CARE_PROVIDER_SITE_OTHER): Payer: Medicare Other | Admitting: Medical-Surgical

## 2021-07-17 DIAGNOSIS — R635 Abnormal weight gain: Secondary | ICD-10-CM | POA: Diagnosis not present

## 2021-07-17 DIAGNOSIS — R634 Abnormal weight loss: Secondary | ICD-10-CM

## 2021-07-17 MED ORDER — PHENTERMINE HCL 37.5 MG PO CAPS: 37.5000 mg | ORAL_CAPSULE | ORAL | 0 refills | Status: DC

## 2021-07-17 NOTE — Progress Notes (Signed)
Pt informed of RX and the need to taper off the medication beginning with her next refill.  Pt expressed understanding and is agreeable.  Tiajuana Amass, CMA

## 2021-07-17 NOTE — Progress Notes (Signed)
Phentermine 37.5 mg prescription sent in for another month.  After this, we will need to look at tapering off as we have maxed out safe usage of this medication.  When she returns in 4 weeks for blood pressure and weight check, we will need to go back to phentermine 15 mg daily with a slow taper plan after that. Need to take at least a few months off the medication then can consider restarting if needed.  ___________________________________________ Thayer Ohm, DNP, APRN, FNP-BC Primary Care and Sports Medicine [facility]

## 2021-07-17 NOTE — Progress Notes (Signed)
Established Patient Office Visit  Subjective:  Patient ID: Barbara Calderon, female    DOB: [birth year]  Age: 56 y.o. MRN: [id number]  CC:  Chief Complaint  Patient presents with   Weight Check    HPI [name] is here for blood pressure and weight check. Diet and exercise is going well. Denies trouble sleeping or palpitations.    Past Medical History:  Diagnosis Date   Abnormal mammogram    Abnormal Pap smear of cervix    Anxiety    Arthritis    Asthma    Depression    GERD (gastroesophageal reflux disease)    Hashimoto's disease    Heart murmur    Hyperlipidemia    borderline- no meds   Lymphedema    Lymphedema    bilateral legs   Neuromuscular disorder (HCC)    fibro many years ago told this, neurpathy legs   PONV (postoperative nausea and vomiting)    Sleep apnea    no CPAP   Thyroid disease    Hashimotos   Varicose veins     Past Surgical History:  Procedure Laterality Date   ABDOMINAL HYSTERECTOMY     partial   APPLICATION OF WOUND VAC     on left lower leg above foot   BLADDER SUSPENSION     CESAREAN SECTION     x2   CHOLECYSTECTOMY OPEN     INCISION AND DRAINAGE OF WOUND     PARTIAL HYSTERECTOMY     SKIN GRAFT     TUBAL LIGATION      Family History  Problem Relation Age of Onset   Colon polyps Mother    Stroke Mother    Hypertension Mother    Colon polyps Father    Prostate cancer Father    Colon cancer Neg Hx    Esophageal cancer Neg Hx    Stomach cancer Neg Hx    Rectal cancer Neg Hx     Social History   Socioeconomic History   Marital status: Married    Spouse name: Not on file   Number of children: Not on file   Years of education: Not on file   Highest education level: Not on file  Occupational History   Not on file  Tobacco Use   Smoking status: Never   Smokeless tobacco: Never  Vaping Use   Vaping Use: Never used  Substance and Sexual Activity   Alcohol use: Yes    Comment: occasionally   Drug use: No    Sexual activity: Yes    Partners: Male    Birth control/protection: Surgical  Other Topics Concern   Not on file  Social History Narrative   Not on file   Social Determinants of Health   Financial Resource Strain: Not on file  Food Insecurity: Not on file  Transportation Needs: Not on file  Physical Activity: Not on file  Stress: Not on file  Social Connections: Not on file  Intimate Partner Violence: Not on file    Outpatient Medications Prior to Visit  Medication Sig Dispense Refill   albuterol (VENTOLIN HFA) 108 (90 Base) MCG/ACT inhaler USE 2 INHALATIONS BY MOUTH  EVERY 4 HOURS AS NEEDED FOR WHEEZING 51 g 3   aspirin EC 81 MG tablet Take 81 mg by mouth daily. Swallow whole.     Cholecalciferol (VITAMIN D3) 1.25 MG (50000 UT) CAPS Take 1 capsule by mouth daily.     citalopram (CELEXA) 40 MG tablet Take 1  tablet (40 mg total) by mouth daily. 90 tablet 3   diclofenac (VOLTAREN) 75 MG EC tablet TAKE 1 TABLET BY MOUTH  TWICE DAILY 180 tablet 3   furosemide (LASIX) 20 MG tablet Take 1 tablet by mouth daily. PRN     gabapentin (NEURONTIN) 100 MG capsule Take 1 capsule (100 mg total) by mouth daily. (Patient taking differently: Take 100 mg by mouth daily. In the am) 90 capsule 90   gabapentin (NEURONTIN) 300 MG capsule Take 1 capsule (300 mg total) by mouth every evening. 90 capsule 3   iron polysaccharides (NIFEREX) 150 MG capsule Take 1 capsule by mouth daily.     Na Sulfate-K Sulfate-Mg Sulf 17.5-3.13-1.6 GM/177ML SOLN Suprep (no substitutions)-TAKE AS DIRECTED. 354 mL 0   oxybutynin (DITROPAN-XL) 5 MG 24 hr tablet TAKE 1 TABLET BY MOUTH AT  BEDTIME 90 tablet 3   pantoprazole (PROTONIX) 40 MG tablet TAKE 1 TABLET BY MOUTH  DAILY 90 tablet 3   phentermine 37.5 MG capsule Take 1 capsule (37.5 mg total) by mouth every morning. 30 capsule 0   potassium chloride SA (KLOR-CON) 20 MEQ tablet Take 1 tablet by mouth daily as needed. ONLY ON THE DAYS OF TAKING LASIX     SYNTHROID 125 MCG  tablet TAKE 1 TABLET BY MOUTH  DAILY BEFORE BREAKFAST 90 tablet 0   topiramate (TOPAMAX) 25 MG tablet Take 1 tablet (25 mg total) by mouth 2 (two) times daily. 180 tablet 1   traMADol (ULTRAM) 50 MG tablet TAKE 1 TABLET BY MOUTH  EVERY 6 HOURS AS NEEDED 60 tablet 0   No facility-administered medications prior to visit.    Allergies  Allergen Reactions   Hydrocodone-Acetaminophen Hives and Itching   Latex Rash    ROS Review of Systems    Objective:    Physical Exam  BP 121/62    Pulse 64    Wt 249 lb (112.9 kg)    SpO2 99%    BMI 41.44 kg/m  Wt Readings from Last 3 Encounters:  07/17/21 249 lb (112.9 kg)  07/07/21 252 lb 6.4 oz (114.5 kg)  06/08/21 253 lb 4.8 oz (114.9 kg)     Health Maintenance Due  Topic Date Due   Pneumococcal Vaccine 72-12 Years old (1 - PCV) Never done   PAP SMEAR-Modifier  Never done   COLONOSCOPY (Pts 45-75yrs Insurance coverage will need to be confirmed)  Never done   COVID-19 Vaccine (2 - Booster for [employer] series) 04/23/2020    There are no preventive care reminders to display for this patient.  Lab Results  Component Value Date   TSH 0.19 (L) 12/12/2020   Lab Results  Component Value Date   WBC 7.2 10/07/2020   HGB 14.4 10/07/2020   HCT 43.2 10/07/2020   MCV 90.9 10/07/2020   PLT 269 10/07/2020   Lab Results  Component Value Date   NA 140 10/07/2020   K 4.2 10/07/2020   CO2 24 10/07/2020   GLUCOSE 92 10/07/2020   BUN 22 10/07/2020   CREATININE 0.78 10/07/2020   BILITOT 0.6 10/07/2020   ALKPHOS 88 05/26/2007   AST 14 10/07/2020   ALT 12 10/07/2020   PROT 7.1 10/07/2020   ALBUMIN 4.4 05/26/2007   CALCIUM 9.5 10/07/2020   Lab Results  Component Value Date   CHOL 214 (H) 10/07/2020   Lab Results  Component Value Date   HDL 69 10/07/2020   Lab Results  Component Value Date   LDLCALC 130 (H) 10/07/2020  Lab Results  Component Value Date   TRIG 63 10/07/2020   Lab Results  Component Value Date   CHOLHDL 3.1  10/07/2020   No results found for: HGBA1C    Assessment & Plan:  Obesity - Abnormal weight gain - Patient has lost weight. A refill on phentermine sent to pharmacy. Patient advised to schedule a follow up in 4 weeks.     Problem List Items Addressed This Visit   None Visit Diagnoses     Severe obesity (BMI >= 40) (HCC)    -  Primary       No orders of the defined types were placed in this encounter.   Follow-up: Return in about 4 weeks (around 08/14/2021) for blood pressure and weight check with Joy. Earna Coder.    Raunak Antuna, Janalyn HarderAngela Hale, CMA

## 2021-07-21 ENCOUNTER — Encounter: Payer: Medicare Other | Admitting: Gastroenterology

## 2021-08-03 ENCOUNTER — Ambulatory Visit: Payer: Medicare Other | Admitting: Neurology

## 2021-08-03 ENCOUNTER — Telehealth: Payer: Self-pay | Admitting: Neurology

## 2021-08-03 ENCOUNTER — Other Ambulatory Visit: Payer: Self-pay

## 2021-08-03 ENCOUNTER — Encounter: Payer: Self-pay | Admitting: Neurology

## 2021-08-03 VITALS — BP 127/85 | HR 64 | Ht 65.0 in | Wt 253.0 lb

## 2021-08-03 DIAGNOSIS — R519 Headache, unspecified: Secondary | ICD-10-CM | POA: Diagnosis not present

## 2021-08-03 DIAGNOSIS — R9089 Other abnormal findings on diagnostic imaging of central nervous system: Secondary | ICD-10-CM | POA: Diagnosis not present

## 2021-08-03 DIAGNOSIS — G8929 Other chronic pain: Secondary | ICD-10-CM

## 2021-08-03 MED ORDER — TOPIRAMATE 100 MG PO TABS: 100.0000 mg | ORAL_TABLET | Freq: Two times a day (BID) | ORAL | 11 refills | Status: DC

## 2021-08-03 MED ORDER — SUMATRIPTAN SUCCINATE 25 MG PO TABS: 25.0000 mg | ORAL_TABLET | ORAL | 11 refills | Status: DC | PRN

## 2021-08-03 NOTE — Telephone Encounter (Signed)
[facility] order sent to [facility], [facility] they will reach out to the patient to schedule.

## 2021-08-03 NOTE — Progress Notes (Signed)
Chief Complaint  Patient presents with   New Patient (Initial Visit)    Room 15 NP/Internal referral for balance problems, ataxia, vision changes and dizziness C/o daily morning headaches , reports no falls, numbness in left leg and toe sx started getting worse last summer 2022      ASSESSMENT AND PLAN  COPELAND LAPIER is a 56 y.o. female  Abnormal MRI of the brain,  Left posterior median parietal lobe signal abnormality, differentiation diagnosis dysplasia versus hamartoma,  Ordered repeat MRI of the brain with and without contrast, to be done after May 2023  Frequent headaches,  Consistent with medicine rebound headache,  Stop daily Tylenol use  Increase Topamax to 100 mg twice a day as migraine prevention also for the benefit of weight loss  Imitrex 25 mg as needed, may mix with Aleve as needed  Gait abnormality  Most likely related to her significant right knee pain, deformity,   DIAGNOSTIC DATA (LABS, IMAGING, TESTING) - I reviewed patient records, labs, notes, testing and imaging myself where available. MRI of the brain with without contrast in November 2022 No acute or reversible finding. No evidence of demyelinating disease.   Subcentimeter focus of T2 and FLAIR signal within the medial left posterior parietal cortex. Favored diagnosis is small dysplasia or hamartoma. No abnormal enhancement, mass effect, edema or hemorrhage. I think this is unlikely to be clinically significant. However, stability should be established. Initial follow-up exam suggested in 3-6 months.   MEDICAL HISTORY:  Barbara Calderon is a 56 year old female, seen in request by nurse practitioner Christen Butter for evaluation of balance problems, vision change, morning headaches, initial evaluation was on August 03, 2021.  I reviewed and summarized the referring note. PMHX. Depression, anxiety x 12 years. Hypothyroidism Obesity, Weight loss program, Topamax 25mg  qhs, phentermine 37.5mg   daily, has lost 25 LB/6 months  She complains of gradual onset bilateral lower extremity paresthesia since 2018, frequent lower extremity swelling, leg pain, has been treated with Lasix as needed, tramadol as needed for pain  In addition, she complains of gait abnormality, contributed to her significant lower extremity swelling, to the point require wound care, wrapping of her leg, difficulty bearing weight, she also complains of worsening low back pain, radiating pain to bilateral lower extremity, also complains of increased urinary frequency, start taking oxybutynin since October 2022 with some help.  Since summer 2022, she began to notice increased bilateral lower extremity numbness, especially at left foot.  Also feel intermittent left arm numbness  For that reason, she was referred for MRI of the brain with without contrast November 2022, we personally reviewed the film: Subcentimeter foci of T2/flair signal within the medial left posterior parietal cortex, favored a diagnosis of small dysplasia or hamartoma per radiology  She had a long history of migraine headache, increased headache frequency over the past few years, began to take frequent over-the-counter medications,   PHYSICAL EXAM:   Vitals:   08/03/21 0932  BP: 127/85  Pulse: 64  Weight: 253 lb (114.8 kg)  Height: 5\' 5"  (1.651 m)   Not recorded     Body mass index is 42.1 kg/m.  PHYSICAL EXAMNIATION:  Gen: NAD, conversant, well nourised, well groomed                     Cardiovascular: Regular rate rhythm, no peripheral edema, warm, nontender. Eyes: Conjunctivae clear without exudates or hemorrhage Neck: Supple, no carotid bruits. Pulmonary: Clear to auscultation bilaterally  NEUROLOGICAL EXAM:  MENTAL STATUS: Tired looking middle-aged female, Speech:    Speech is normal; fluent and spontaneous with normal comprehension.  Cognition:     Orientation to time, place and person     Normal recent and remote  memory     Normal Attention span and concentration     Normal Language, naming, repeating,spontaneous speech     Fund of knowledge   CRANIAL NERVES: CN II: Visual fields are full to confrontation. Pupils are round equal and briskly reactive to light. CN III, IV, VI: extraocular movement are normal. No ptosis. CN V: Facial sensation is intact to light touch CN VII: Face is symmetric with normal eye closure  CN VIII: Hearing is normal to causal conversation. CN IX, X: Phonation is normal. CN XI: Head turning and shoulder shrug are intact CN XII: Narrow oropharyngeal space  MOTOR: There is no pronator drift of out-stretched arms. Muscle bulk and tone are normal. Muscle strength is normal.  REFLEXES: Reflexes are 2+ and symmetric at the biceps, triceps, knees, and ankles. Plantar responses are flexor.  SENSORY: Intact to light touch, pinprick and vibratory sensation are intact in fingers and toes.  COORDINATION: There is no trunk or limb dysmetria noted.  GAIT/STANCE: She can get up from seated position arm crossed, antalgic, right knee deformity  REVIEW OF SYSTEMS:  Full 14 system review of systems performed and notable only for as above All other review of systems were negative.   ALLERGIES: Allergies  Allergen Reactions   Hydrocodone-Acetaminophen Hives and Itching   Latex Rash    HOME MEDICATIONS: Current Outpatient Medications  Medication Sig Dispense Refill   albuterol (VENTOLIN HFA) 108 (90 Base) MCG/ACT inhaler USE 2 INHALATIONS BY MOUTH  EVERY 4 HOURS AS NEEDED FOR WHEEZING 51 g 3   aspirin EC 81 MG tablet Take 81 mg by mouth daily. Swallow whole.     Cholecalciferol (VITAMIN D3) 1.25 MG (50000 UT) CAPS Take 1 capsule by mouth daily.     citalopram (CELEXA) 40 MG tablet Take 1 tablet (40 mg total) by mouth daily. 90 tablet 3   diclofenac (VOLTAREN) 75 MG EC tablet TAKE 1 TABLET BY MOUTH  TWICE DAILY 180 tablet 3   furosemide (LASIX) 20 MG tablet Take 1 tablet by  mouth daily. PRN     gabapentin (NEURONTIN) 100 MG capsule Take 1 capsule (100 mg total) by mouth daily. (Patient taking differently: Take 100 mg by mouth daily. In the am) 90 capsule 90   gabapentin (NEURONTIN) 300 MG capsule Take 1 capsule (300 mg total) by mouth every evening. 90 capsule 3   iron polysaccharides (NIFEREX) 150 MG capsule Take 1 capsule by mouth daily.     Na Sulfate-K Sulfate-Mg Sulf 17.5-3.13-1.6 GM/177ML SOLN Suprep (no substitutions)-TAKE AS DIRECTED. 354 mL 0   oxybutynin (DITROPAN-XL) 5 MG 24 hr tablet TAKE 1 TABLET BY MOUTH AT  BEDTIME 90 tablet 3   pantoprazole (PROTONIX) 40 MG tablet TAKE 1 TABLET BY MOUTH  DAILY 90 tablet 3   phentermine 37.5 MG capsule Take 1 capsule (37.5 mg total) by mouth every morning. 30 capsule 0   potassium chloride SA (KLOR-CON) 20 MEQ tablet Take 1 tablet by mouth daily as needed. ONLY ON THE DAYS OF TAKING LASIX     SYNTHROID 125 MCG tablet TAKE 1 TABLET BY MOUTH  DAILY BEFORE BREAKFAST 90 tablet 0   topiramate (TOPAMAX) 25 MG tablet Take 1 tablet (25 mg total) by mouth 2 (two) times daily.  180 tablet 1   traMADol (ULTRAM) 50 MG tablet TAKE 1 TABLET BY MOUTH  EVERY 6 HOURS AS NEEDED 60 tablet 0   No current facility-administered medications for this visit.    PAST MEDICAL HISTORY: Past Medical History:  Diagnosis Date   Abnormal mammogram    Abnormal Pap smear of cervix    Anxiety    Arthritis    Asthma    Depression    GERD (gastroesophageal reflux disease)    Hashimoto's disease    Heart murmur    Hyperlipidemia    borderline- no meds   Lymphedema    Lymphedema    bilateral legs   Neuromuscular disorder (HCC)    fibro many years ago told this, neurpathy legs   PONV (postoperative nausea and vomiting)    Sleep apnea    no CPAP   Thyroid disease    Hashimotos   Varicose veins     PAST SURGICAL HISTORY: Past Surgical History:  Procedure Laterality Date   ABDOMINAL HYSTERECTOMY     partial   APPLICATION OF WOUND VAC      on left lower leg above foot   BLADDER SUSPENSION     CESAREAN SECTION     x2   CHOLECYSTECTOMY OPEN     INCISION AND DRAINAGE OF WOUND     PARTIAL HYSTERECTOMY     SKIN GRAFT     TUBAL LIGATION      FAMILY HISTORY: Family History  Problem Relation Age of Onset   Colon polyps Mother    Stroke Mother    Hypertension Mother    Colon polyps Father    Prostate cancer Father    Colon cancer Neg Hx    Esophageal cancer Neg Hx    Stomach cancer Neg Hx    Rectal cancer Neg Hx     SOCIAL HISTORY: Social History   Socioeconomic History   Marital status: Married    Spouse name: Not on file   Number of children: Not on file   Years of education: Not on file   Highest education level: Not on file  Occupational History   Not on file  Tobacco Use   Smoking status: Never   Smokeless tobacco: Never  Vaping Use   Vaping Use: Never used  Substance and Sexual Activity   Alcohol use: Yes    Comment: occasionally   Drug use: No   Sexual activity: Yes    Partners: Male    Birth control/protection: Surgical  Other Topics Concern   Not on file  Social History Narrative   Not on file   Social Determinants of Health   Financial Resource Strain: Not on file  Food Insecurity: Not on file  Transportation Needs: Not on file  Physical Activity: Not on file  Stress: Not on file  Social Connections: Not on file  Intimate Partner Violence: Not on file      Levert FeinsteinYijun Lanaysia Fritchman, M.D. Ph.D.  [facility] [address], [address] [place], [place] [address] Ph: [phone] Fax: [phone]  CC:  Christen ButterJessup, Joy, NP [address] [place],  [place] [address]  Christen ButterJessup, Joy, NP

## 2021-08-07 ENCOUNTER — Telehealth: Payer: Medicare Other | Admitting: Physician Assistant

## 2021-08-07 ENCOUNTER — Ambulatory Visit: Payer: Medicare Other | Admitting: Physician Assistant

## 2021-08-07 ENCOUNTER — Ambulatory Visit (INDEPENDENT_AMBULATORY_CARE_PROVIDER_SITE_OTHER): Payer: Medicare Other | Admitting: Medical-Surgical

## 2021-08-07 DIAGNOSIS — Z0000 Encounter for general adult medical examination without abnormal findings: Secondary | ICD-10-CM

## 2021-08-07 DIAGNOSIS — J208 Acute bronchitis due to other specified organisms: Secondary | ICD-10-CM | POA: Diagnosis not present

## 2021-08-07 MED ORDER — PREDNISONE 10 MG (21) PO TBPK: ORAL_TABLET | ORAL | 0 refills | Status: DC

## 2021-08-07 MED ORDER — BENZONATATE 100 MG PO CAPS: 100.0000 mg | ORAL_CAPSULE | Freq: Three times a day (TID) | ORAL | 0 refills | Status: DC | PRN

## 2021-08-07 MED ORDER — IPRATROPIUM BROMIDE 0.03 % NA SOLN: 2.0000 | Freq: Two times a day (BID) | NASAL | 0 refills | Status: DC

## 2021-08-07 NOTE — Patient Instructions (Addendum)
MEDICARE ANNUAL WELLNESS VISIT Health Maintenance Summary and Written Plan of Care  Barbara Calderon ,  Thank you for allowing me to perform your Medicare Annual Wellness Visit and for your ongoing commitment to your health.   Health Maintenance & Immunization History Health Maintenance  Topic Date Due   PAP SMEAR-Modifier  08/07/2021 (Originally 01/05/1986)   COVID-19 Vaccine (2 - Booster for Janssen series) 08/23/2021 (Originally 04/23/2020)   TETANUS/TDAP  10/07/2021 (Originally 07/09/2017)   HIV Screening  10/07/2021 (Originally 01/06/1980)   Zoster Vaccines- Shingrix (1 of 2) 11/05/2021 (Originally 01/06/2015)   COLONOSCOPY (Pts 45-6yrs Insurance coverage will need to be confirmed)  08/07/2022 (Originally 01/05/2010)   MAMMOGRAM  11/04/2022   INFLUENZA VACCINE  Completed   Hepatitis C Screening  Completed   HPV VACCINES  Aged Out   Immunization History  Administered Date(s) Administered   Influenza,inj,Quad PF,6+ Mos 03/10/2021   Influenza,inj,Quad PF,6-35 Mos 07/31/2018   Influenza-Unspecified 07/09/2009, 04/23/2013, 05/04/2014, 04/27/2015   Janssen (J&J) SARS-COV-2 Vaccination 02/27/2020   Tdap 07/10/2007    These are the patient goals that we discussed:  Goals Addressed              This Visit's Progress     Patient Stated (pt-stated)        Would like to loose 50 lbs.        This is a list of Health Maintenance Items that are overdue or due now: Td vaccine Screening Pap smear and pelvic exam  Colorectal cancer screening Shingrix vaccine  Orders/Referrals Placed Today: No orders of the defined types were placed in this encounter.  (Contact our referral department at [phone] if you have not spoken with someone about your referral appointment within the next 5 days)    Follow-up Plan Follow-up with Christen Butter, NP as planned Schedule your Shingrix vaccine and  tetanus shot at the pharmacy.  Please call and make your appointment for your pap  smear. Medicare wellness visit in one year. Patient will access AVS on my chart.      Health Maintenance, Female Adopting a healthy lifestyle and getting preventive care are important in promoting health and wellness. Ask your health care provider about: The right schedule for you to have regular tests and exams. Things you can do on your own to prevent diseases and keep yourself healthy. What should I know about diet, weight, and exercise? Eat a healthy diet  Eat a diet that includes plenty of vegetables, fruits, low-fat dairy products, and lean protein. Do not eat a lot of foods that are high in solid fats, added sugars, or sodium. Maintain a healthy weight Body mass index (BMI) is used to identify weight problems. It estimates body fat based on height and weight. Your health care provider can help determine your BMI and help you achieve or maintain a healthy weight. Get regular exercise Get regular exercise. This is one of the most important things you can do for your health. Most adults should: Exercise for at least 150 minutes each week. The exercise should increase your heart rate and make you sweat (moderate-intensity exercise). Do strengthening exercises at least twice a week. This is in addition to the moderate-intensity exercise. Spend less time sitting. Even light physical activity can be beneficial. Watch cholesterol and blood lipids Have your blood tested for lipids and cholesterol at 57 years of age, then have this test every 5 years. Have your cholesterol levels checked more often if: Your lipid or cholesterol levels are high. You  are older than 57 years of age. You are at high risk for heart disease. What should I know about cancer screening? Depending on your health history and family history, you may need to have cancer screening at various ages. This may include screening for: Breast cancer. Cervical cancer. Colorectal cancer. Skin cancer. Lung cancer. What  should I know about heart disease, diabetes, and high blood pressure? Blood pressure and heart disease High blood pressure causes heart disease and increases the risk of stroke. This is more likely to develop in people who have high blood pressure readings or are overweight. Have your blood pressure checked: Every 3-5 years if you are 9218-57 years of age. Every year if you are 541 years old or older. Diabetes Have regular diabetes screenings. This checks your fasting blood sugar level. Have the screening done: Once every three years after age 57 if you are at a normal weight and have a low risk for diabetes. More often and at a younger age if you are overweight or have a high risk for diabetes. What should I know about preventing infection? Hepatitis B If you have a higher risk for hepatitis B, you should be screened for this virus. Talk with your health care provider to find out if you are at risk for hepatitis B infection. Hepatitis C Testing is recommended for: Everyone born from [address]1945 through 1965. Anyone with known risk factors for hepatitis C. Sexually transmitted infections (STIs) Get screened for STIs, including gonorrhea and chlamydia, if: You are sexually active and are younger than 57 years of age. You are older than 57 years of age and your health care provider tells you that you are at risk for this type of infection. Your sexual activity has changed since you were last screened, and you are at increased risk for chlamydia or gonorrhea. Ask your health care provider if you are at risk. Ask your health care provider about whether you are at high risk for HIV. Your health care provider may recommend a prescription medicine to help prevent HIV infection. If you choose to take medicine to prevent HIV, you should first get tested for HIV. You should then be tested every 3 months for as long as you are taking the medicine. Pregnancy If you are about to stop having your period  (premenopausal) and you may become pregnant, seek counseling before you get pregnant. Take 400 to 800 micrograms (mcg) of folic acid every day if you become pregnant. Ask for birth control (contraception) if you want to prevent pregnancy. Osteoporosis and menopause Osteoporosis is a disease in which the bones lose minerals and strength with aging. This can result in bone fractures. If you are 57 years old or older, or if you are at risk for osteoporosis and fractures, ask your health care provider if you should: Be screened for bone loss. Take a calcium or vitamin D supplement to lower your risk of fractures. Be given hormone replacement therapy (HRT) to treat symptoms of menopause. Follow these instructions at home: Alcohol use Do not drink alcohol if: Your health care provider tells you not to drink. You are pregnant, may be pregnant, or are planning to become pregnant. If you drink alcohol: Limit how much you have to: 0-1 drink a day. Know how much alcohol is in your drink. In the [place] one drink equals one 12 oz bottle of beer (355 mL), one 5 oz glass of wine (148 mL), or one 1 oz glass of hard liquor (44 mL).  Lifestyle Do not use any products that contain nicotine or tobacco. These products include cigarettes, chewing tobacco, and vaping devices, such as e-cigarettes. If you need help quitting, ask your health care provider. Do not use street drugs. Do not share needles. Ask your health care provider for help if you need support or information about quitting drugs. General instructions Schedule regular health, dental, and eye exams. Stay current with your vaccines. Tell your health care provider if: You often feel depressed. You have ever been abused or do not feel safe at home. Summary Adopting a healthy lifestyle and getting preventive care are important in promoting health and wellness. Follow your health care provider's instructions about healthy diet, exercising, and getting  tested or screened for diseases. Follow your health care provider's instructions on monitoring your cholesterol and blood pressure. This information is not intended to replace advice given to you by your health care provider. Make sure you discuss any questions you have with your health care provider. Document Revised: 11/14/2020 Document Reviewed: 11/14/2020 Elsevier Patient Education  2022 [employer].

## 2021-08-07 NOTE — Progress Notes (Signed)
MEDICARE ANNUAL WELLNESS VISIT  08/07/2021  Telephone Visit Disclaimer This Medicare AWV was conducted by telephone due to national recommendations for restrictions regarding the COVID-19 Pandemic (e.g. social distancing).  I verified, using two identifiers, that I am speaking with Barbara Calderon or their authorized healthcare agent. I discussed the limitations, risks, security, and privacy concerns of performing an evaluation and management service by telephone and the potential availability of an in-person appointment in the future. The patient expressed understanding and agreed to proceed.  Location of Patient: Home Location of Provider (nurse):  In the office.  Subjective:    Barbara Calderon is a 56 y.o. female patient of Christen Butter, NP who had a Medicare Annual Wellness Visit today via telephone. Barbara Calderon is Legally disabled and lives with their family. she has 4 children and one foster child (74 month old). she reports that she is socially active and does interact with friends/family regularly. she is minimally physically active and enjoys crafts.  Patient Care Team: Christen Butter, NP as PCP - General (Nurse Practitioner)  Advanced Directives 08/07/2021 10/07/2020  Does Patient Have a Medical Advance Directive? No No  Would patient like information on creating a medical advance directive? No - Patient declined No - Patient declined    Hospital Utilization Over the Past 12 Months: # of hospitalizations or ER visits: 0 # of surgeries: 0  Review of Systems    Patient reports that her overall health is worse compared to last year.  History obtained from chart review and the patient  Patient Reported Readings (BP, Pulse, CBG, Weight, etc) none  Pain Assessment Pain : 0-10 Pain Score: 5  Pain Type: Chronic pain Pain Location: Leg Pain Orientation: Left, Right Pain Descriptors / Indicators: Constant Pain Onset: More than a month ago Pain Frequency: Constant Pain Relieving  Factors: none  Pain Relieving Factors: none  Current Medications & Allergies (verified) Allergies as of 08/07/2021       Reactions   Hydrocodone-acetaminophen Hives, Itching   Latex Rash        Medication List        Accurate as of August 07, 2021  9:13 AM. If you have any questions, ask your nurse or doctor.          albuterol 108 (90 Base) MCG/ACT inhaler Commonly known as: VENTOLIN HFA USE 2 INHALATIONS BY MOUTH  EVERY 4 HOURS AS NEEDED FOR WHEEZING   aspirin EC 81 MG tablet Take 81 mg by mouth daily. Swallow whole.   citalopram 40 MG tablet Commonly known as: CELEXA Take 1 tablet (40 mg total) by mouth daily.   diclofenac 75 MG EC tablet Commonly known as: VOLTAREN TAKE 1 TABLET BY MOUTH  TWICE DAILY   furosemide 20 MG tablet Commonly known as: LASIX Take 1 tablet by mouth daily. PRN   gabapentin 100 MG capsule Commonly known as: NEURONTIN Take 1 capsule (100 mg total) by mouth daily. What changed: additional instructions   gabapentin 300 MG capsule Commonly known as: NEURONTIN Take 1 capsule (300 mg total) by mouth every evening. What changed: Another medication with the same name was changed. Make sure you understand how and when to take each.   iron polysaccharides 150 MG capsule Commonly known as: NIFEREX Take 1 capsule by mouth daily.   Na Sulfate-K Sulfate-Mg Sulf 17.5-3.13-1.6 GM/177ML Soln Suprep (no substitutions)-TAKE AS DIRECTED.   oxybutynin 5 MG 24 hr tablet Commonly known as: DITROPAN-XL TAKE 1 TABLET BY MOUTH AT  BEDTIME  pantoprazole 40 MG tablet Commonly known as: PROTONIX TAKE 1 TABLET BY MOUTH  DAILY   phentermine 37.5 MG capsule Take 1 capsule (37.5 mg total) by mouth every morning.   potassium chloride SA 20 MEQ tablet Commonly known as: KLOR-CON M Take 1 tablet by mouth daily as needed. ONLY ON THE DAYS OF TAKING LASIX   SUMAtriptan 25 MG tablet Commonly known as: Imitrex Take 1 tablet (25 mg total) by mouth every  2 (two) hours as needed for migraine. May repeat in 2 hours if headache persists or recurs.   Synthroid 125 MCG tablet Generic drug: levothyroxine TAKE 1 TABLET BY MOUTH  DAILY BEFORE BREAKFAST   topiramate 100 MG tablet Commonly known as: TOPAMAX Take 1 tablet (100 mg total) by mouth 2 (two) times daily.   traMADol 50 MG tablet Commonly known as: ULTRAM TAKE 1 TABLET BY MOUTH  EVERY 6 HOURS AS NEEDED   Vitamin D3 1.25 MG (50000 UT) Caps Take 1 capsule by mouth daily.        History (reviewed): Past Medical History:  Diagnosis Date   Abnormal mammogram    Abnormal Pap smear of cervix    Anxiety    Arthritis    Asthma    Depression    GERD (gastroesophageal reflux disease)    Hashimoto's disease    Heart murmur    Hyperlipidemia    borderline- no meds   Lymphedema    Lymphedema    bilateral legs   Neuromuscular disorder (HCC)    fibro many years ago told this, neurpathy legs   PONV (postoperative nausea and vomiting)    Sleep apnea    no CPAP   Thyroid disease    Hashimotos   Varicose veins    Past Surgical History:  Procedure Laterality Date   ABDOMINAL HYSTERECTOMY     partial   APPLICATION OF WOUND VAC     on left lower leg above foot   BLADDER SUSPENSION     CESAREAN SECTION     x2   CHOLECYSTECTOMY OPEN     INCISION AND DRAINAGE OF WOUND     PARTIAL HYSTERECTOMY     SKIN GRAFT     TUBAL LIGATION     Family History  Problem Relation Age of Onset   Colon polyps Mother    Stroke Mother    Hypertension Mother    Colon polyps Father    Prostate cancer Father    Colon cancer Neg Hx    Esophageal cancer Neg Hx    Stomach cancer Neg Hx    Rectal cancer Neg Hx    Social History   Socioeconomic History   Marital status: Married    Spouse name: Not on file   Number of children: 4   Years of education: 12   Highest education level: 12th grade  Occupational History    Comment: Legally disabled.  Tobacco Use   Smoking status: Never    Smokeless tobacco: Never  Vaping Use   Vaping Use: Never used  Substance and Sexual Activity   Alcohol use: Yes    Comment: occasionally   Drug use: No   Sexual activity: Yes    Partners: Male    Birth control/protection: Surgical  Other Topics Concern   Not on file  Social History Narrative   Lives with her family. She lives with her husband and 4 children and one foster child (13 month old). She enjoys crafts.   Social Determinants of Health  Financial Resource Strain: Low Risk    Difficulty of Paying Living Expenses: Not hard at all  Food Insecurity: No Food Insecurity   Worried About Programme researcher, broadcasting/film/video in the Last Year: Never true   Ran Out of Food in the Last Year: Never true  Transportation Needs: No Transportation Needs   Lack of Transportation (Medical): No   Lack of Transportation (Non-Medical): No  Physical Activity: Inactive   Days of Exercise per Week: 0 days   Minutes of Exercise per Session: 0 min  Stress: No Stress Concern Present   Feeling of Stress : Not at all  Social Connections: Moderately Isolated   Frequency of Communication with Friends and Family: More than three times a week   Frequency of Social Gatherings with Friends and Family: More than three times a week   Attends Religious Services: Never   Database administrator or Organizations: No   Attends Banker Meetings: Never   Marital Status: Married    Activities of Daily Living In your present state of health, do you have any difficulty performing the following activities: 08/07/2021  Hearing? Y  Comment bilateral hearing loss.  Vision? Y  Comment has noticed some vision changes. she is due to go back to the eye doctor.  Difficulty concentrating or making decisions? Y  Comment has noticed short-term memory loss.  Walking or climbing stairs? Y  Comment due to arthritis in both knees.  Dressing or bathing? N  Doing errands, shopping? N  Preparing Food and eating ? N  Using the  Toilet? N  In the past six months, have you accidently leaked urine? Y  Comment stress incontinence.  Do you have problems with loss of bowel control? N  Managing your Medications? N  Managing your Finances? N  Housekeeping or managing your Housekeeping? N  Some recent data might be hidden    Patient Education/ Literacy How often do you need to have someone help you when you read instructions, pamphlets, or other written materials from your doctor or pharmacy?: 1 - Never What is the last grade level you completed in school?: 12th grade  Exercise Current Exercise Habits: Home exercise routine, Type of exercise: walking, Time (Minutes): 15, Frequency (Times/Week): 3, Weekly Exercise (Minutes/Week): 45, Intensity: Mild, Exercise limited by: neurologic condition(s)  Diet Patient reports consuming  2-3  meals a day and 1 snack(s) a day Patient reports that her primary diet is: Regular Patient reports that she does have regular access to food.   Depression Screen PHQ 2/9 Scores 08/07/2021 06/08/2021 02/08/2021 10/07/2020  PHQ - 2 Score 0 0 0 0  PHQ- 9 Score 0 0 0 2     Fall Risk Fall Risk  08/07/2021 06/08/2021  Falls in the past year? 1 0  Number falls in past yr: 1 0  Injury with Fall? 1 0  Risk for fall due to : History of fall(s);Impaired balance/gait;Impaired mobility No Fall Risks  Follow up Falls evaluation completed;Education provided;Falls prevention discussed Falls evaluation completed     Objective:  Barbara Calderon seemed alert and oriented and she participated appropriately during our telephone visit.  Blood Pressure Weight BMI  BP Readings from Last 3 Encounters:  08/03/21 127/85  07/17/21 121/62  06/08/21 123/84   Wt Readings from Last 3 Encounters:  08/03/21 253 lb (114.8 kg)  07/17/21 249 lb (112.9 kg)  07/07/21 252 lb 6.4 oz (114.5 kg)   BMI Readings from Last 1 Encounters:  08/03/21 42.10  kg/m    *Unable to obtain current vital signs, weight, and BMI due to  telephone visit type  Hearing/Vision  Barbara Calderon did not seem to have difficulty with hearing/understanding during the telephone conversation Reports that she has had a formal eye exam by an eye care professional within the past year Reports that she has not had a formal hearing evaluation within the past year *Unable to fully assess hearing and vision during telephone visit type  Cognitive Function: 6CIT Screen 08/07/2021  What Year? 0 points  What month? 0 points  What time? 0 points  Count back from 20 0 points  Months in reverse 0 points  Repeat phrase 0 points  Total Score 0   (Normal:0-7, Significant for Dysfunction: >8)  Normal Cognitive Function Screening: Yes   Immunization & Health Maintenance Record Immunization History  Administered Date(s) Administered   Influenza,inj,Quad PF,6+ Mos 03/10/2021   Influenza,inj,Quad PF,6-35 Mos 07/31/2018   Influenza-Unspecified 07/09/2009, 04/23/2013, 05/04/2014, 04/27/2015   Janssen (J&J) SARS-COV-2 Vaccination 02/27/2020   Tdap 07/10/2007    Health Maintenance  Topic Date Due   PAP SMEAR-Modifier  08/07/2021 (Originally 01/05/1986)   COVID-19 Vaccine (2 - Booster for Janssen series) 08/23/2021 (Originally 04/23/2020)   TETANUS/TDAP  10/07/2021 (Originally 07/09/2017)   HIV Screening  10/07/2021 (Originally 01/06/1980)   Zoster Vaccines- Shingrix (1 of 2) 11/05/2021 (Originally 01/06/2015)   COLONOSCOPY (Pts 45-4248yrs Insurance coverage will need to be confirmed)  08/07/2022 (Originally 01/05/2010)   MAMMOGRAM  11/04/2022   INFLUENZA VACCINE  Completed   Hepatitis C Screening  Completed   HPV VACCINES  Aged Out       Assessment  This is a routine wellness examination for Barbara Calderon.  Health Maintenance: Due or Overdue There are no preventive care reminders to display for this patient.   Barbara Calderon does not need a referral for Community Assistance: Care Management:   no Social Work:    no Prescription  Assistance:  no Nutrition/Diabetes Education:  no   Plan:  Personalized Goals  Goals Addressed               This Visit's Progress     Patient Stated (pt-stated)        Would like to loose 50 lbs.       Personalized Health Maintenance & Screening Recommendations  Td vaccine Screening Pap smear and pelvic exam  Colorectal cancer screening Shingrix vaccine  Lung Cancer Screening Recommended: no (Low Dose CT Chest recommended if Age 76-80 years, 30 pack-year currently smoking OR have quit w/in past 15 years) Hepatitis C Screening recommended: no HIV Screening recommended: yes  Advanced Directives: Written information was not prepared per patient's request.  Referrals & Orders No orders of the defined types were placed in this encounter.   Follow-up Plan Follow-up with Christen ButterJessup, Joy, NP as planned Schedule your Shingrix vaccine and  tetanus shot at the pharmacy.  Please call and make your appointment for your pap smear. Medicare wellness visit in one year. Patient will access AVS on my chart.   I have personally reviewed and noted the following in the patients chart:   Medical and social history Use of alcohol, tobacco or illicit drugs  Current medications and supplements Functional ability and status Nutritional status Physical activity Advanced directives List of other physicians Hospitalizations, surgeries, and ER visits in previous 12 months Vitals Screenings to include cognitive, depression, and falls Referrals and appointments  In addition, I have reviewed and discussed with Barbara Calderon  certain preventive protocols, quality metrics, and best practice recommendations. A written personalized care plan for preventive services as well as general preventive health recommendations is available and can be mailed to the patient at her request.      [place] CharonBableen  Elantra Caprara, RN  08/07/2021

## 2021-08-07 NOTE — Progress Notes (Signed)
We are sorry that you are not feeling well.  Here is how we plan to help!  Based on your presentation I believe you most likely have A cough due to a virus.  This is called viral bronchitis and is best treated by rest, plenty of fluids and control of the cough.  You may use Ibuprofen or Tylenol as directed to help your symptoms.     In addition you may use A prescription cough medication called Tessalon Perles 100mg . You may take 1-2 capsules every 8 hours as needed for your cough.  Prednisone 10 mg daily for 6 days (see taper instructions below)  Directions for 6 day taper: Day 1: 2 tablets before breakfast, 1 after both lunch & dinner and 2 at bedtime Day 2: 1 tab before breakfast, 1 after both lunch & dinner and 2 at bedtime Day 3: 1 tab at each meal & 1 at bedtime Day 4: 1 tab at breakfast, 1 at lunch, 1 at bedtime Day 5: 1 tab at breakfast & 1 tab at bedtime Day 6: 1 tab at breakfast  I have also prescribed Ipratropium Bromide nasal spray. Use 1-2 sprays each nostril twice daily for 5-7 days.   From your responses in the eVisit questionnaire you describe inflammation in the upper respiratory tract which is causing a significant cough.  This is commonly called Bronchitis and has four common causes:   Allergies Viral Infections Acid Reflux Bacterial Infection Allergies, viruses and acid reflux are treated by controlling symptoms or eliminating the cause. An example might be a cough caused by taking certain blood pressure medications. You stop the cough by changing the medication. Another example might be a cough caused by acid reflux. Controlling the reflux helps control the cough.  USE OF BRONCHODILATOR ("RESCUE") INHALERS: There is a risk from using your bronchodilator too frequently.  The risk is that over-reliance on a medication which only relaxes the muscles surrounding the breathing tubes can reduce the effectiveness of medications prescribed to reduce swelling and congestion of  the tubes themselves.  Although you feel brief relief from the bronchodilator inhaler, your asthma may actually be worsening with the tubes becoming more swollen and filled with mucus.  This can delay other crucial treatments, such as oral steroid medications. If you need to use a bronchodilator inhaler daily, several times per day, you should discuss this with your provider.  There are probably better treatments that could be used to keep your asthma under control.     HOME CARE Only take medications as instructed by your medical team. Complete the entire course of an antibiotic. Drink plenty of fluids and get plenty of rest. Avoid close contacts especially the very young and the elderly Cover your mouth if you cough or cough into your sleeve. Always remember to wash your hands A steam or ultrasonic humidifier can help congestion.   GET HELP RIGHT AWAY IF: You develop worsening fever. You become short of breath You cough up blood. Your symptoms persist after you have completed your treatment plan MAKE SURE YOU  Understand these instructions. Will watch your condition. Will get help right away if you are not doing well or get worse.    Thank you for choosing an e-visit.  Your e-visit answers were reviewed by a board certified advanced clinical practitioner to complete your personal care plan. Depending upon the condition, your plan could have included both over the counter or prescription medications.  Please review your pharmacy choice. Make sure the  pharmacy is open so you can pick up prescription now. If there is a problem, you may contact your provider through [employer] and have the prescription routed to another pharmacy.  Your safety is important to [place]. If you have drug allergies check your prescription carefully.   For the next 24 hours you can use MyChart to ask questions about today's visit, request a non-urgent call back, or ask for a work or school excuse. You will get  an email in the next two days asking about your experience. I hope that your e-visit has been valuable and will speed your recovery.  I provided 5 minutes of non face-to-face time during this encounter for chart review and documentation.

## 2021-08-14 ENCOUNTER — Other Ambulatory Visit: Payer: Self-pay | Admitting: Medical-Surgical

## 2021-08-21 NOTE — Progress Notes (Signed)
°  HPI with pertinent ROS:   CC: Weight loss, neurology follow-up  HPI: Pleasant 56 year old female presenting today for the following:  Weight loss-has been taking phentermine for several months at the 37.5 mg dose.  Tolerating well without side effects.  She is at a point where we need to wean this down for safety.  She is aware of the need for weaning.  Has been extremely stressed and busy lately with her husband's illness so has not been as studious and her efforts for weight loss.  Regardless, she has lost nearly 8 pounds since her appointment on 1/26.  She is currently taking Topamax that has been increased to 200 mg daily by neurology for migraine management.  She is tolerating the medication well and it is helping with appetite suppression.  She was able to see neurology regarding the follow-up on her abnormal MRI.  Unfortunately, she did not feel that her visit was very productive.  She is requesting a referral to a different neurologist for second opinion.  Notes that she is still having numbness to all 5 toes of the left foot.  She is able to feel pressure and temperature however the toes are tingly with decreased sensation overall.  I reviewed the past medical history, family history, social history, surgical history, and allergies today and no changes were needed.  Please see the problem list section below in epic for further details.   Physical exam:   General: Well Developed, well nourished, and in no acute distress.  Neuro: Alert and oriented x3,.  HEENT: Normocephalic, atraumatic.  Skin: Warm and dry. Cardiac: Regular rate and rhythm, no murmurs rubs or gallops, no lower extremity edema.  Respiratory: Clear to auscultation bilaterally. Not using accessory muscles, speaking in full sentences.  Impression and Recommendations:    1. Encounter for weight management Reducing phentermine to 15 mg daily x4 weeks then every other day x2 weeks then twice weekly for 2 weeks.  Continue  Topamax at 200 mg daily for appetite suppression and per neurology.  2. Peripheral neuropathy, idiopathic Unclear etiology.  Low suspicion for medication side effect.  Referring to different neurologist for second opinion per patient request.  Return for General follow-up in 3-4 months.  ___________________________________________ Thayer Ohm, DNP, APRN, FNP-BC Primary Care and Sports Medicine [facility] [place]

## 2021-08-22 ENCOUNTER — Other Ambulatory Visit: Payer: Self-pay

## 2021-08-22 ENCOUNTER — Encounter: Payer: Self-pay | Admitting: Medical-Surgical

## 2021-08-22 ENCOUNTER — Ambulatory Visit (INDEPENDENT_AMBULATORY_CARE_PROVIDER_SITE_OTHER): Payer: Medicare Other | Admitting: Medical-Surgical

## 2021-08-22 VITALS — BP 117/79 | HR 69 | Resp 20 | Ht 65.0 in | Wt 245.3 lb

## 2021-08-22 DIAGNOSIS — R9389 Abnormal findings on diagnostic imaging of other specified body structures: Secondary | ICD-10-CM

## 2021-08-22 DIAGNOSIS — Z7689 Persons encountering health services in other specified circumstances: Secondary | ICD-10-CM | POA: Diagnosis not present

## 2021-08-22 DIAGNOSIS — G609 Hereditary and idiopathic neuropathy, unspecified: Secondary | ICD-10-CM

## 2021-08-22 MED ORDER — PHENTERMINE HCL 15 MG PO CAPS: ORAL_CAPSULE | ORAL | 0 refills | Status: DC

## 2021-08-30 ENCOUNTER — Other Ambulatory Visit: Payer: Self-pay | Admitting: Medical-Surgical

## 2021-09-01 ENCOUNTER — Encounter: Payer: Self-pay | Admitting: Gastroenterology

## 2021-09-04 ENCOUNTER — Other Ambulatory Visit: Payer: Self-pay | Admitting: *Deleted

## 2021-09-04 ENCOUNTER — Encounter: Payer: Self-pay | Admitting: Neurology

## 2021-09-04 MED ORDER — TOPIRAMATE 100 MG PO TABS: 100.0000 mg | ORAL_TABLET | Freq: Two times a day (BID) | ORAL | 3 refills | Status: DC

## 2021-09-04 NOTE — Progress Notes (Addendum)
? ?ANNUAL EXAM ?Patient name: Barbara Calderon MRN [id number]  Date of birth: 1966-02-05 ?Chief Complaint:   ?Annual Exam ? ?History of Present Illness:   ?Barbara Calderon is a 56 y.o. G11P4 Caucasian female being seen today for a routine annual exam.  ? ?Current complaints: Since the sling she has had ongoing both urge and SUI despite the sling. She has no other symptoms of prolapse.  ? ?No LMP recorded. Patient has had a hysterectomy. ?- supracervical hyst with salpingectomy with interval posterior colporrhaphy and MUS - done because of pain and bleeding. Done at [facility]. ? ? ?Last pap unknown. H/O abnormal pap: yes >25 years ago - normal since that time.  ?Last mammogram: 10/2020. Results were: normal. Family h/o breast cancer: no ?Last colonoscopy: 09/2021. Results were: normal. Family h/o colorectal cancer: no ? ?Depression screen [facility] 2/9 08/22/2021 08/07/2021 06/08/2021 02/08/2021 10/07/2020  ?Decreased Interest 0 0 0 0 0  ?Down, Depressed, Hopeless 0 0 0 0 0  ?PHQ - 2 Score 0 0 0 0 0  ?Altered sleeping 0 0 0 0 0  ?Tired, decreased energy 0 0 0 0 1  ?Change in appetite 0 0 0 0 1  ?Feeling bad or failure about yourself  0 0 0 0 0  ?Trouble concentrating 0 0 0 0 0  ?Moving slowly or fidgety/restless 0 0 0 0 0  ?Suicidal thoughts 0 0 0 0 0  ?PHQ-9 Score 0 0 0 0 2  ?Difficult doing work/chores - - Not difficult at all - Not difficult at all  ? ?  ?GAD 7 : Generalized Anxiety Score 10/07/2020  ?Nervous, Anxious, on Edge 1  ?Control/stop worrying 2  ?Worry too much - different things 1  ?Trouble relaxing 1  ?Restless 0  ?Easily annoyed or irritable 1  ?Afraid - awful might happen 0  ?Total GAD 7 Score 6  ?Anxiety Difficulty Not difficult at all  ? ? ? ?Review of Systems:   ?Pertinent items are noted in HPI ?Denies any headaches, blurred vision, fatigue, shortness of breath, chest pain, abdominal pain, abnormal vaginal discharge/itching/odor/irritation, problems with periods, bowel movements unless otherwise stated  above. ?Pertinent History Reviewed:  ?Reviewed past medical,surgical, social and family history.  ?Reviewed problem list, medications and allergies. ?Physical Assessment:  ? ?Vitals:  ? 09/07/21 1539  ?BP: 121/88  ?Pulse: (!) 56  ?Resp: 16  ?Weight: 246 lb (111.6 kg)  ?Height: 5\' 5"  (1.651 m)  ?Body mass index is 40.94 kg/m?. ?  ?     Physical Examination:  ? General appearance - well appearing, and in no distress ? Mental status - alert, oriented to person, place, and time ? Psych:  She has a normal mood and affect ? Skin - warm and dry, normal color, no suspicious lesions noted ? Chest - effort normal, all lung fields clear to auscultation bilaterally ? Heart - normal rate and regular rhythm ? Neck:  midline trachea, no thyromegaly or nodules ? Breasts - breasts appear normal, no suspicious masses, no skin or nipple changes or axillary nodes ? Abdomen - soft, nontender, nondistended, no masses or organomegaly ? Pelvic - VULVA: normal appearing vulva with no masses, tenderness or lesions   VAGINA: normal appearing vagina with normal color and discharge, no lesions but noted to have anterior vaginal wall prolapse to hymenal ring without valsalva  CERVIX: normal appearing cervix without discharge or lesions, no CMT ? Thin prep pap is done with HR HPV cotesting ? UTERUS: surgically absent  ?  ADNEXA: No adnexal masses or tenderness noted. ? Extremities:  No swelling or varicosities noted ? ?Chaperone present for exam ? ?No results found for this or any previous visit (from the past 24 hour(s)).  ?Assessment & Plan:  ?1) Well-Woman Exam ?- Cervical cancer screening: Discussed guidelines. Pap with HPV done  ?- Breast Health: Encouraged self breast awareness/SBE. Discussed limits of clinical breast exam for detecting breast cancer. Discussed importance of annual MXR.  Rx given ?- Climacteric/Sexual health: Reviewed typical and atypical symptoms of menopause/peri-menopause. Discussed PMB and to call if any amount of  spotting.  ?- Bone Health: Calcium via diet and supplementation. Discussed weight bearing exercise. DEXA due by age 54-65  ?- Colonoscopy: up to date ?- F/U 12 months and prn ? ?2) Mixed incontinence ?- Reviewed value of PFPT  ?- Discussed if refractory to PFPT, would advise follow up with urogyn due to anterior vaginal wall prolapse as well potentially impacting symptoms.  ? ?3) Vaginal dryness ?- Vaginal dryness most likely due to being postmenopausal ?- Recommended OTC lubricants i.e. silicone or oil based as well as vaginal moisturizer i.e. Replens ?- Discussed if these initial options fail, would also consider vaginal estrogen. Discussed different formulations of vaginal estrogen. Reviewed the risks of vaginal estrogen.  ?- She would like to  try silicone based lubricant ? ? ?Mammogram:  April 2023 , or sooner if problems ?Colonoscopy: per GI, or sooner if problems ? ?Orders Placed This Encounter  ?Procedures  ? MM 3D SCREEN BREAST BILATERAL  ? Ambulatory referral to Physical Therapy  ? ? ?Meds: No orders of the defined types were placed in this encounter. ? ? ?Follow-up: Return in about 1 year (around 09/08/2022) for annual. ? ?Radene Gunning, MD ?09/07/2021 ?4:52 PM ?

## 2021-09-05 ENCOUNTER — Encounter: Payer: Self-pay | Admitting: Gastroenterology

## 2021-09-05 ENCOUNTER — Other Ambulatory Visit: Payer: Self-pay

## 2021-09-05 ENCOUNTER — Ambulatory Visit (AMBULATORY_SURGERY_CENTER): Payer: Medicare Other | Admitting: Gastroenterology

## 2021-09-05 VITALS — BP 117/75 | HR 47 | Temp 98.7°F | Resp 13 | Ht 64.0 in | Wt 245.0 lb

## 2021-09-05 DIAGNOSIS — Z1211 Encounter for screening for malignant neoplasm of colon: Secondary | ICD-10-CM | POA: Diagnosis not present

## 2021-09-05 DIAGNOSIS — K219 Gastro-esophageal reflux disease without esophagitis: Secondary | ICD-10-CM | POA: Diagnosis not present

## 2021-09-05 MED ORDER — SODIUM CHLORIDE 0.9 % IV SOLN: 500.0000 mL | INTRAVENOUS | Status: DC

## 2021-09-05 NOTE — Patient Instructions (Signed)
Resume previous diet.  Continue present medications.  Handout given for Diverticulosis.   YOU HAD AN ENDOSCOPIC PROCEDURE TODAY AT [facility]:   Refer to the procedure report that was given to you for any specific questions about what was found during the examination.  If the procedure report does not answer your questions, please call your gastroenterologist to clarify.  If you requested that your care partner not be given the details of your procedure findings, then the procedure report has been included in a sealed envelope for you to review at your convenience later.  YOU SHOULD EXPECT: Some feelings of bloating in the abdomen. Passage of more gas than usual.  Walking can help get rid of the air that was put into your GI tract during the procedure and reduce the bloating. If you had a lower endoscopy (such as a colonoscopy or flexible sigmoidoscopy) you may notice spotting of blood in your stool or on the toilet paper. If you underwent a bowel prep for your procedure, you may not have a normal bowel movement for a few days.  Please Note:  You might notice some irritation and congestion in your nose or some drainage.  This is from the oxygen used during your procedure.  There is no need for concern and it should clear up in a day or so.  SYMPTOMS TO REPORT IMMEDIATELY:  Following lower endoscopy (colonoscopy or flexible sigmoidoscopy):  Excessive amounts of blood in the stool  Significant tenderness or worsening of abdominal pains  Swelling of the abdomen that is new, acute  Fever of 100F or higher  For urgent or emergent issues, a gastroenterologist can be reached at any hour by calling [phone]. Do not use MyChart messaging for urgent concerns.    DIET:  We do recommend a small meal at first, but then you may proceed to your regular diet.  Drink plenty of fluids but you should avoid alcoholic beverages for 24 hours.  ACTIVITY:  You should plan to take it easy for  the rest of today and you should NOT DRIVE or use heavy machinery until tomorrow (because of the sedation medicines used during the test).    FOLLOW UP: Our staff will call the number listed on your records 48-72 hours following your procedure to check on you and address any questions or concerns that you may have regarding the information given to you following your procedure. If we do not reach you, we will leave a message.  We will attempt to reach you two times.  During this call, we will ask if you have developed any symptoms of COVID 19. If you develop any symptoms (ie: fever, flu-like symptoms, shortness of breath, cough etc.) before then, please call [phone].  If you test positive for Covid 19 in the 2 weeks post procedure, please call and report this information to [place].    If any biopsies were taken you will be contacted by phone or by letter within the next 1-3 weeks.  Please call us at [phone] if you have not heard about the biopsies in 3 weeks.    SIGNATURES/CONFIDENTIALITY: You and/or your care partner have signed paperwork which will be entered into your electronic medical record.  These signatures attest to the fact that that the information above on your After Visit Summary has been reviewed and is understood.  Full responsibility of the confidentiality of this discharge information lies with you and/or your care-partner.

## 2021-09-05 NOTE — Progress Notes (Signed)
[facility] History and Physical   Primary Care Physician:  Christen Butter, NP   Reason for Procedure:   Colon cancer screening  Plan:    colonoscopy     HPI: Barbara Calderon is a 56 y.o. female  here for colonoscopy screening - first time exam. Patient denies any bowel symptoms at this time. No family history of colon cancer known. Otherwise feels well without any cardiopulmonary symptoms.    Past Medical History:  Diagnosis Date   Abnormal mammogram    Abnormal Pap smear of cervix    Anxiety    Arthritis    Asthma    Depression    GERD (gastroesophageal reflux disease)    Hashimoto's disease    Heart murmur    Hyperlipidemia    borderline- no meds   Lymphedema    Lymphedema    bilateral legs   Neuromuscular disorder (HCC)    fibro many years ago told this, neurpathy legs   PONV (postoperative nausea and vomiting)    Sleep apnea    no CPAP   Thyroid disease    Hashimotos   Varicose veins     Past Surgical History:  Procedure Laterality Date   ABDOMINAL HYSTERECTOMY     partial   APPLICATION OF WOUND VAC     on left lower leg above foot   BLADDER SUSPENSION     CESAREAN SECTION     x2   CHOLECYSTECTOMY OPEN     INCISION AND DRAINAGE OF WOUND     PARTIAL HYSTERECTOMY     SKIN GRAFT     TUBAL LIGATION      Prior to Admission medications   Medication Sig Start Date End Date Taking? Authorizing Provider  aspirin EC 81 MG tablet Take 81 mg by mouth daily. Swallow whole.   Yes [provider]  citalopram (CELEXA) 40 MG tablet Take 1 tablet (40 mg total) by mouth daily. 10/07/20  Yes Christen Butter, NP  diclofenac (VOLTAREN) 75 MG EC tablet TAKE 1 TABLET BY MOUTH  TWICE DAILY 03/01/21  Yes Christen Butter, NP  gabapentin (NEURONTIN) 100 MG capsule Take 1 capsule (100 mg total) by mouth daily. Patient taking differently: Take 100 mg by mouth daily. In the am 10/07/20  Yes Christen Butter, NP  gabapentin (NEURONTIN) 300 MG capsule TAKE 1 CAPSULE BY MOUTH IN   THE EVENING 08/15/21  Yes Jessup, Joy, NP  oxybutynin (DITROPAN-XL) 5 MG 24 hr tablet TAKE 1 TABLET BY MOUTH AT  BEDTIME 07/07/21  Yes Jessup, Joy, NP  SYNTHROID 125 MCG tablet TAKE 1 TABLET BY MOUTH DAILY  BEFORE BREAKFAST 08/31/21  Yes Christen Butter, NP  topiramate (TOPAMAX) 100 MG tablet Take 1 tablet (100 mg total) by mouth 2 (two) times daily. 09/04/21  Yes Levert Feinstein, MD  albuterol (VENTOLIN HFA) 108 (90 Base) MCG/ACT inhaler USE 2 INHALATIONS BY MOUTH  EVERY 4 HOURS AS NEEDED FOR WHEEZING 07/07/21   Christen Butter, NP  furosemide (LASIX) 20 MG tablet Take 1 tablet by mouth daily. PRN 10/31/19   [provider]  pantoprazole (PROTONIX) 40 MG tablet TAKE 1 TABLET BY MOUTH  DAILY 07/07/21   Christen Butter, NP  phentermine 15 MG capsule Take 1 capsule (15 mg) daily x4 weeks then decrease to every other day x2 weeks then twice weekly x2 weeks.  Then stop. 08/22/21   Christen Butter, NP  potassium chloride SA (KLOR-CON) 20 MEQ tablet Take 1 tablet by mouth daily as needed. ONLY ON THE  DAYS OF TAKING LASIX 10/23/19   [provider]  SUMAtriptan (IMITREX) 25 MG tablet Take 1 tablet (25 mg total) by mouth every 2 (two) hours as needed for migraine. May repeat in 2 hours if headache persists or recurs. Patient not taking: Reported on 09/05/2021 08/03/21   Levert Feinstein, MD  traMADol (ULTRAM) 50 MG tablet TAKE 1 TABLET BY MOUTH  EVERY 6 HOURS AS NEEDED 03/27/21   Christen Butter, NP    Current Outpatient Medications  Medication Sig Dispense Refill   aspirin EC 81 MG tablet Take 81 mg by mouth daily. Swallow whole.     citalopram (CELEXA) 40 MG tablet Take 1 tablet (40 mg total) by mouth daily. 90 tablet 3   diclofenac (VOLTAREN) 75 MG EC tablet TAKE 1 TABLET BY MOUTH  TWICE DAILY 180 tablet 3   gabapentin (NEURONTIN) 100 MG capsule Take 1 capsule (100 mg total) by mouth daily. (Patient taking differently: Take 100 mg by mouth daily. In the am) 90 capsule 90   gabapentin (NEURONTIN) 300 MG capsule TAKE 1  CAPSULE BY MOUTH IN  THE EVENING 90 capsule 3   oxybutynin (DITROPAN-XL) 5 MG 24 hr tablet TAKE 1 TABLET BY MOUTH AT  BEDTIME 90 tablet 3   SYNTHROID 125 MCG tablet TAKE 1 TABLET BY MOUTH DAILY  BEFORE BREAKFAST 90 tablet 0   topiramate (TOPAMAX) 100 MG tablet Take 1 tablet (100 mg total) by mouth 2 (two) times daily. 180 tablet 3   albuterol (VENTOLIN HFA) 108 (90 Base) MCG/ACT inhaler USE 2 INHALATIONS BY MOUTH  EVERY 4 HOURS AS NEEDED FOR WHEEZING 51 g 3   furosemide (LASIX) 20 MG tablet Take 1 tablet by mouth daily. PRN     pantoprazole (PROTONIX) 40 MG tablet TAKE 1 TABLET BY MOUTH  DAILY 90 tablet 3   phentermine 15 MG capsule Take 1 capsule (15 mg) daily x4 weeks then decrease to every other day x2 weeks then twice weekly x2 weeks.  Then stop. 60 capsule 0   potassium chloride SA (KLOR-CON) 20 MEQ tablet Take 1 tablet by mouth daily as needed. ONLY ON THE DAYS OF TAKING LASIX     SUMAtriptan (IMITREX) 25 MG tablet Take 1 tablet (25 mg total) by mouth every 2 (two) hours as needed for migraine. May repeat in 2 hours if headache persists or recurs. (Patient not taking: Reported on 09/05/2021) 12 tablet 11   traMADol (ULTRAM) 50 MG tablet TAKE 1 TABLET BY MOUTH  EVERY 6 HOURS AS NEEDED 60 tablet 0   Current Facility-Administered Medications  Medication Dose Route Frequency Provider Last Rate Last Admin   0.9 %  sodium chloride infusion  500 mL Intravenous Continuous Mariel Lukins, Willaim Rayas, MD        Allergies as of 09/05/2021 - Review Complete 09/05/2021  Allergen Reaction Noted   Hydrocodone-acetaminophen Hives and Itching 10/04/2004   Latex Rash 09/20/2011    Family History  Problem Relation Age of Onset   Colon polyps Mother    Stroke Mother    Hypertension Mother    Colon polyps Father    Prostate cancer Father    Colon cancer Neg Hx    Esophageal cancer Neg Hx    Stomach cancer Neg Hx    Rectal cancer Neg Hx     Social History   Socioeconomic History   Marital status:  Married    Spouse name: Not on file   Number of children: 4   Years of education: 52  Highest education level: 12th grade  Occupational History    Comment: Legally disabled.  Tobacco Use   Smoking status: Never   Smokeless tobacco: Never  Vaping Use   Vaping Use: Never used  Substance and Sexual Activity   Alcohol use: Yes    Comment: occasionally   Drug use: No   Sexual activity: Yes    Partners: Male    Birth control/protection: Surgical  Other Topics Concern   Not on file  Social History Narrative   Lives with her family. She lives with her husband and 4 children and one foster child (46 month old). She enjoys crafts.   Social Determinants of Health   Financial Resource Strain: Low Risk    Difficulty of Paying Living Expenses: Not hard at all  Food Insecurity: No Food Insecurity   Worried About Programme researcher, broadcasting/film/video in the Last Year: Never true   Ran Out of Food in the Last Year: Never true  Transportation Needs: No Transportation Needs   Lack of Transportation (Medical): No   Lack of Transportation (Non-Medical): No  Physical Activity: Inactive   Days of Exercise per Week: 0 days   Minutes of Exercise per Session: 0 min  Stress: No Stress Concern Present   Feeling of Stress : Not at all  Social Connections: Moderately Isolated   Frequency of Communication with Friends and Family: More than three times a week   Frequency of Social Gatherings with Friends and Family: More than three times a week   Attends Religious Services: Never   Database administrator or Organizations: No   Attends Engineer, structural: Never   Marital Status: Married  Catering manager Violence: Not At Risk   Fear of Current or Ex-Partner: No   Emotionally Abused: No   Physically Abused: No   Sexually Abused: No    Review of Systems: All other review of systems negative except as mentioned in the HPI.  Physical Exam: Vital signs BP 119/62    Pulse (!) 58    Temp 98.7 F (37.1  C)    Ht 5\' 4"  (1.626 m)    Wt 245 lb (111.1 kg)    SpO2 98%    BMI 42.05 kg/m   General:   Alert,  Well-developed, pleasant and cooperative in NAD Lungs:  Clear throughout to auscultation.   Heart:  Regular rate and rhythm Abdomen:  Soft, nontender and nondistended.   Neuro/Psych:  Alert and cooperative. Normal mood and affect. A and O x 3  Harlin Rain, MD [facility]

## 2021-09-05 NOTE — Progress Notes (Signed)
VS per CNW ?

## 2021-09-05 NOTE — Progress Notes (Signed)
Report to PACU, RN, vss, BBS= Clear.  

## 2021-09-05 NOTE — Op Note (Signed)
[facility] Patient Name: Barbara Calderon Procedure Date: 09/05/2021 8:58 AM MRN: [id number] Endoscopist: Viviann Spare P. Adela Lank , MD Age: 56 Referring MD:  Date of Birth: 09-Jun-1966 Gender: Female Account #: [id number] Procedure:                Colonoscopy Indications:              Screening for colorectal malignant neoplasm, This                            is the patient's first colonoscopy Medicines:                Monitored Anesthesia Care Procedure:                Pre-Anesthesia Assessment:                           - Prior to the procedure, a History and Physical                            was performed, and patient medications and                            allergies were reviewed. The patient's tolerance of                            previous anesthesia was also reviewed. The risks                            and benefits of the procedure and the sedation                            options and risks were discussed with the patient.                            All questions were answered, and informed consent                            was obtained. Prior Anticoagulants: The patient has                            taken no previous anticoagulant or antiplatelet                            agents. ASA Grade Assessment: III - A patient with                            severe systemic disease. After reviewing the risks                            and benefits, the patient was deemed in                            satisfactory condition to undergo the procedure.  After obtaining informed consent, the colonoscope                            was passed under direct vision. Throughout the                            procedure, the patient's blood pressure, pulse, and                            oxygen saturations were monitored continuously. The                            Olympus PCF-H190DL ([id number]) Colonoscope was                            introduced through  the anus and advanced to the the                            cecum, identified by appendiceal orifice and                            ileocecal valve. The colonoscopy was performed                            without difficulty. The patient tolerated the                            procedure well. The quality of the bowel                            preparation was good. The ileocecal valve,                            appendiceal orifice, and rectum were photographed. Scope In: 9:05:00 AM Scope Out: 9:26:05 AM Scope Withdrawal Time: 0 hours 17 minutes 0 seconds  Total Procedure Duration: 0 hours 21 minutes 5 seconds  Findings:                 The perianal and digital rectal examinations were                            normal.                           Many small-mouthed diverticula were found in the                            entire colon. Mild in right / transverse, severe in                            left colon with restricted mobility of the left                            colon. Pediatric colonoscope used for this exam.  The exam was otherwise without abnormality. No                            polyps. Complications:            No immediate complications. Estimated blood loss:                            None. Estimated Blood Loss:     Estimated blood loss: none. Impression:               - Diverticulosis in the entire examined colon.                            Severe in the left colon with restricted mobility                            of the sigmoid colon.                           - The examination was otherwise normal.                           - No polyps. Recommendation:           - Patient has a contact number available for                            emergencies. The signs and symptoms of potential                            delayed complications were discussed with the                            patient. Return to normal activities tomorrow.                             Written discharge instructions were provided to the                            patient.                           - Resume previous diet.                           - Continue present medications.                           - Repeat colonoscopy in 10 years for screening                            purposes. Viviann Spare P. Adela Lank, MD 09/05/2021 9:30:12 AM This report has been signed electronically.

## 2021-09-07 ENCOUNTER — Telehealth: Payer: Self-pay | Admitting: *Deleted

## 2021-09-07 ENCOUNTER — Other Ambulatory Visit: Payer: Self-pay

## 2021-09-07 ENCOUNTER — Other Ambulatory Visit (HOSPITAL_COMMUNITY)
Admission: RE | Admit: 2021-09-07 | Discharge: 2021-09-07 | Disposition: A | Discharge status: Home / Self Care | Payer: Medicare Other | Source: Ambulatory Visit | Attending: Obstetrics and Gynecology | Admitting: Obstetrics and Gynecology

## 2021-09-07 ENCOUNTER — Ambulatory Visit (INDEPENDENT_AMBULATORY_CARE_PROVIDER_SITE_OTHER): Payer: Medicare Other | Admitting: Obstetrics and Gynecology

## 2021-09-07 ENCOUNTER — Encounter: Payer: Self-pay | Admitting: Obstetrics and Gynecology

## 2021-09-07 VITALS — BP 121/88 | HR 56 | Resp 16 | Ht 65.0 in | Wt 246.0 lb

## 2021-09-07 DIAGNOSIS — N3946 Mixed incontinence: Secondary | ICD-10-CM

## 2021-09-07 DIAGNOSIS — Z01419 Encounter for gynecological examination (general) (routine) without abnormal findings: Secondary | ICD-10-CM | POA: Insufficient documentation

## 2021-09-07 DIAGNOSIS — N951 Menopausal and female climacteric states: Secondary | ICD-10-CM

## 2021-09-07 DIAGNOSIS — Z1151 Encounter for screening for human papillomavirus (HPV): Secondary | ICD-10-CM | POA: Diagnosis not present

## 2021-09-07 NOTE — Telephone Encounter (Signed)
?  Follow up Call- ? ?Call back number 09/05/2021  ?Post procedure Call Back phone  # [phone]  ?Permission to leave phone message Yes  ?Some recent data might be hidden  ?  ? ?Patient questions: ? ?Do you have a fever, pain , or abdominal swelling? No. ?Pain Score  0 * ? ?Have you tolerated food without any problems? Yes.   ? ?Have you been able to return to your normal activities? Yes.   ? ?Do you have any questions about your discharge instructions: ?Diet   No. ?Medications  No. ?Follow up visit  No. ? ?Do you have questions or concerns about your Care? No. ? ?Actions: ?* If pain score is 4 or above: ?No action needed, pain <4. ? ? ?

## 2021-09-07 NOTE — Patient Instructions (Signed)
Replens- vaginal moisturizer

## 2021-09-11 ENCOUNTER — Ambulatory Visit (INDEPENDENT_AMBULATORY_CARE_PROVIDER_SITE_OTHER): Payer: Medicare Other

## 2021-09-11 ENCOUNTER — Other Ambulatory Visit: Payer: Self-pay

## 2021-09-11 ENCOUNTER — Encounter: Payer: Self-pay | Admitting: Medical-Surgical

## 2021-09-11 ENCOUNTER — Ambulatory Visit (INDEPENDENT_AMBULATORY_CARE_PROVIDER_SITE_OTHER): Payer: Medicare Other | Admitting: Medical-Surgical

## 2021-09-11 VITALS — BP 109/78 | HR 64 | Resp 20 | Ht 65.0 in | Wt 247.2 lb

## 2021-09-11 DIAGNOSIS — M25572 Pain in left ankle and joints of left foot: Secondary | ICD-10-CM

## 2021-09-11 DIAGNOSIS — M7989 Other specified soft tissue disorders: Secondary | ICD-10-CM | POA: Diagnosis not present

## 2021-09-11 LAB — CYTOLOGY - PAP
Comment: NEGATIVE
Diagnosis: NEGATIVE
High risk HPV: NEGATIVE

## 2021-09-11 MED ORDER — AMBULATORY NON FORMULARY MEDICATION: 0 refills | Status: AC

## 2021-09-11 MED ORDER — MEDICAL COMPRESSION SOCKS MISC: 2.0000 | Freq: Once | 0 refills | Status: AC

## 2021-09-11 NOTE — Progress Notes (Signed)
?  HPI with pertinent ROS:  ? ?CC: Left ankle pain ? ?HPI: ?Pleasant 56 year old female presenting today for evaluation of left ankle pain.  Notes that her ankle started hurting back in February but she had been treating conservatively with Tylenol/ibuprofen/Voltaren.  Unfortunately, she had a fall on Thursday and her ankle has started to hurt much worse.  She has tenderness along the ankle both laterally and medially and is having difficulty with some of her weightbearing activities. ? ?I reviewed the past medical history, family history, social history, surgical history, and allergies today and no changes were needed.  Please see the problem list section below in epic for further details. ? ? ?Physical exam:  ? ?General: Well Developed, well nourished, and in no acute distress.  ?Neuro: Alert and oriented x3.  ?HEENT: Normocephalic, atraumatic.  ?Skin: Warm and dry. ?Cardiac: Regular rate and rhythm, no murmurs rubs or gallops, no lower extremity edema.  ?Respiratory: Clear to auscultation bilaterally. Not using accessory muscles, speaking in full sentences. ? ?Impression and Recommendations:   ? ?1. Acute left ankle pain ?Low suspicion for fracture or dislocation however we will go ahead and get x-rays today.  Suspect soft tissue injury.  Recommend continuing conservative treatment with Tylenol and Voltaren, avoid additional NSAIDs.  Recommend compression socks/stockings, printed prescriptions provided to see if insurance will cover these.  Reviewed RICE.  Printed exercises for ankle strain/sprain provided for patient to do at home.  If no improvement in 6 weeks with conservative therapy as above, we may need to consider MRI. ?- DG Ankle Complete Left; Future ? ?Return in about 6 weeks (around 10/23/2021) for left ankle pain follow up if not improved. ?___________________________________________ ?Clearnce Sorrel, DNP, APRN, FNP-BC ?Primary Care and Sports Medicine ?[place] ?

## 2021-09-26 ENCOUNTER — Other Ambulatory Visit: Payer: Self-pay | Admitting: Medical-Surgical

## 2021-11-01 ENCOUNTER — Ambulatory Visit (INDEPENDENT_AMBULATORY_CARE_PROVIDER_SITE_OTHER): Payer: Medicare Other | Admitting: Medical-Surgical

## 2021-11-01 ENCOUNTER — Encounter: Payer: Self-pay | Admitting: Medical-Surgical

## 2021-11-01 VITALS — BP 103/71 | HR 65 | Resp 20 | Ht 65.0 in | Wt 236.4 lb

## 2021-11-01 DIAGNOSIS — S0083XA Contusion of other part of head, initial encounter: Secondary | ICD-10-CM

## 2021-11-01 DIAGNOSIS — R9389 Abnormal findings on diagnostic imaging of other specified body structures: Secondary | ICD-10-CM

## 2021-11-01 DIAGNOSIS — R270 Ataxia, unspecified: Secondary | ICD-10-CM

## 2021-11-01 DIAGNOSIS — R200 Anesthesia of skin: Secondary | ICD-10-CM

## 2021-11-01 DIAGNOSIS — R2689 Other abnormalities of gait and mobility: Secondary | ICD-10-CM | POA: Diagnosis not present

## 2021-11-01 NOTE — Progress Notes (Signed)
?  HPI with pertinent ROS:  ? ?CC: left toes numb ? ?HPI: ?Pleasant 56 year old female presenting today for discussion of: ? ?She had a fall in early April while she was out in the yard working in a flower bed that had rocks around it.  She lost her balance and fell hitting her chin and her right shoulder.  She was very sore for several days and had some significant bruising.  This is all resolved but she does still have a little bit of a sore knot on the tip of her chin that she wanted to have evaluated. ? ?Continues to have significant left foot and leg swelling that is accompanied by discomfort.  Has numbness along the left forefoot to her toes.  Has been using a brace as well as compression socks to help with her discomfort and swelling.  She has been off of Topamax for a month after thinking that it may be contributed to the numbness.  She has noted no improvement since stopping the medication.  Taking gabapentin 100 mg in the morning and 300 mg at night.  Has not tried stopping this medication to see if it helps with swelling. ? ?Requesting a referral to neurology for second opinion. ? ?I reviewed the past medical history, family history, social history, surgical history, and allergies today and no changes were needed.  Please see the problem list section below in epic for further details. ? ? ?Physical exam:  ? ?General: Well Developed, well nourished, and in no acute distress.  ?Neuro: Alert and oriented x3.  ?HEENT: Normocephalic, atraumatic.  ?Skin: Warm and dry. ?Cardiac: Regular rate and rhythm, no murmurs rubs or gallops, no lower extremity edema.  ?Respiratory: Clear to auscultation bilaterally. Not using accessory muscles, speaking in full sentences. ? ?Impression and Recommendations:   ? ?1. Numbness of toes ?Unclear etiology but suspect this is related to the significant lower extremity swelling.  Discussed possible causes of the swelling outside of known lymphedema.  She is on gabapentin which has  been known to cause some lower extremity edema.  Discussed possibly reducing her dose of gabapentin versus completely tapering off.  She reports that she will try tapering off to see if her symptoms are manage and if swelling improves. ? ?2. Abnormal MRI ?3. Balance problem ?4. Ataxia ?Referral to neurology for second opinion placed. ?- Ambulatory referral to Neurology ? ?5. Contusion of chin, initial encounter ?The bruising has resolved however she does still have a tender spot and a lump on her chin.  This may likely resolve over the next few weeks.  Advised that we could get some imaging to make sure there is no bone fragment but she would like to just watch it for now. ? ?Return if symptoms worsen or fail to improve. ?___________________________________________ ?Thayer Ohm, DNP, APRN, FNP-BC ?Primary Care and Sports Medicine ?[facility] ?

## 2021-11-04 ENCOUNTER — Other Ambulatory Visit: Payer: Self-pay | Admitting: Medical-Surgical

## 2021-11-06 ENCOUNTER — Ambulatory Visit: Payer: Medicare Other

## 2021-11-07 ENCOUNTER — Encounter: Payer: Self-pay | Admitting: Neurology

## 2021-11-09 ENCOUNTER — Encounter: Payer: Self-pay | Admitting: Medical-Surgical

## 2021-11-09 ENCOUNTER — Ambulatory Visit
Admission: RE | Admit: 2021-11-09 | Discharge: 2021-11-09 | Disposition: A | Discharge status: Home / Self Care | Payer: Medicare Other | Source: Ambulatory Visit | Attending: Neurology | Admitting: Neurology

## 2021-11-09 DIAGNOSIS — G939 Disorder of brain, unspecified: Secondary | ICD-10-CM | POA: Diagnosis not present

## 2021-11-09 DIAGNOSIS — R9089 Other abnormal findings on diagnostic imaging of central nervous system: Secondary | ICD-10-CM

## 2021-11-09 IMAGING — MR MR HEAD WO/W CM
15 series · 48 of 48 positions shown · IV contrast (20 ml multihance)
Comparison: MRI head [DATE]

CLINICAL DATA: Abnormal MRI.  Follow-up lesion.

EXAM:
MRI HEAD WITHOUT AND WITH CONTRAST
TECHNIQUE: Multiplanar, multiecho pulse sequences of the brain and surrounding
structures were obtained without and with intravenous contrast.
CONTRAST:  20mL MULTIHANCE GADOBENATE DIMEGLUMINE 529 MG/ML IV SOLN

[Series 5: T1 · sagittal · 4.0mm · 0.75mm/px · 1 of 31 slices shown (1 of 3)]
[im 1/31]
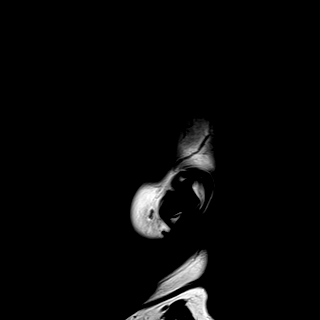

[Series 6: DWI · axial · 3.0mm · 0.94mm/px · z∈[-64,+75]mm · 7 of 160 slices shown (1 of 3)]
[im 1/160]
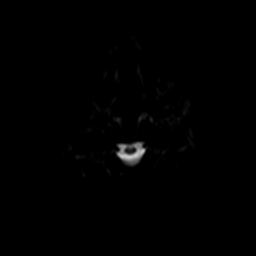
[im 27/160]
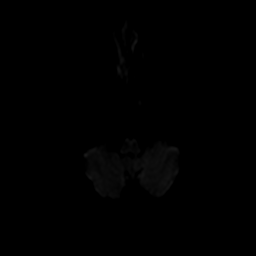
[im 54/160]
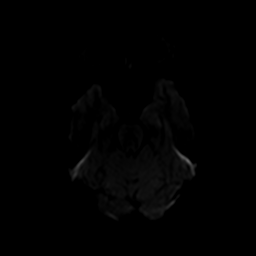
[im 80/160]
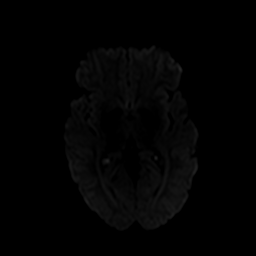
[im 107/160]
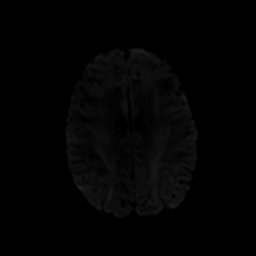
[im 133/160]
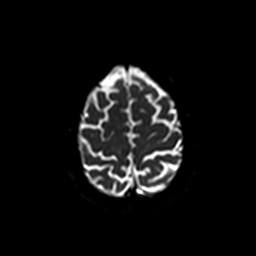
[im 160/160]
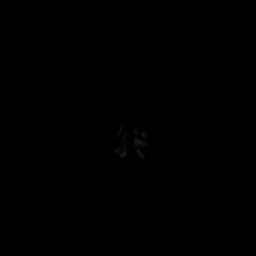

[Series 7: ax dwi_tracew · axial · 3.0mm · 0.94mm/px · z∈[-64,+75]mm · 3 of 80 slices shown]
[im 1/80]
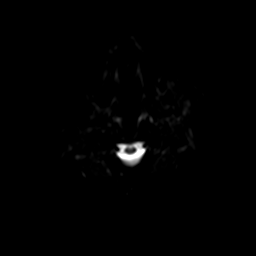
[im 40/80]
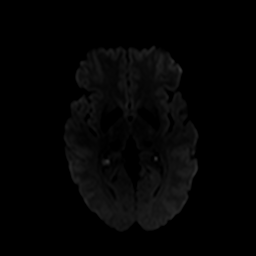
[im 80/80]
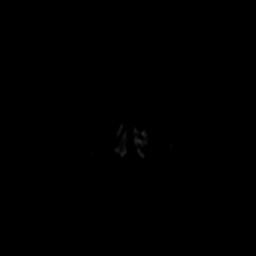

[Series 8: ax dwi_adc · axial · 3.0mm · 0.94mm/px · z∈[-64,+75]mm · 2 of 39 slices shown]
[im 1/39]
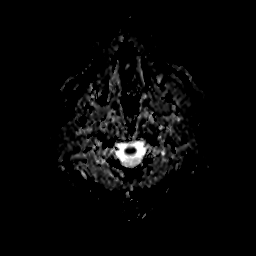
[im 39/39]
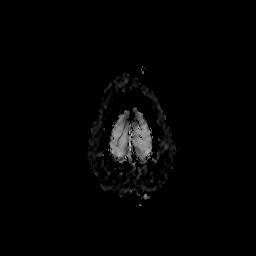

[Series 9: DWI · coronal · 5.0mm · 1.44mm/px · 3 of 64 slices shown (2 of 3)]
[im 1/64]
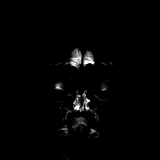
[im 32/64]
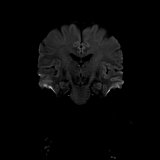
[im 64/64]
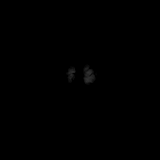

[Series 10: DWI · coronal · 5.0mm · 1.44mm/px · 2 of 32 slices shown (3 of 3)]
[im 1/32]
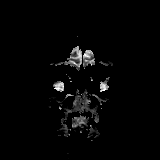
[im 32/32]
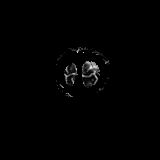

[Series 11: T2 · axial · 4.0mm · 0.36mm/px · 1 of 29 slices shown]
[im 1/29]
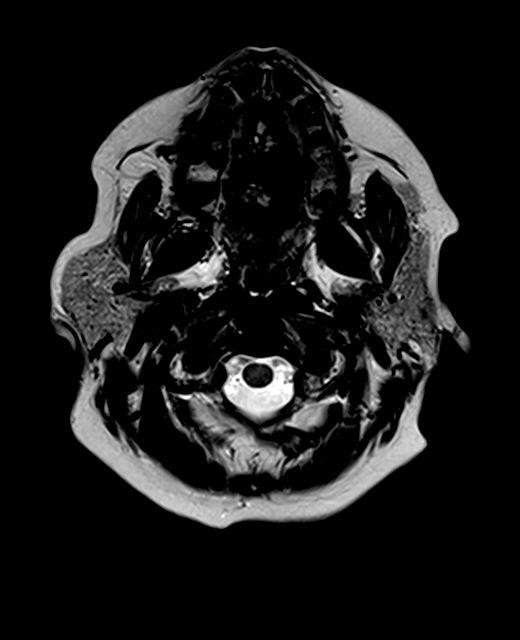

[Series 12: FLAIR · axial · 3.0mm · 0.72mm/px · 1 of 26 slices shown]
[im 1/26]
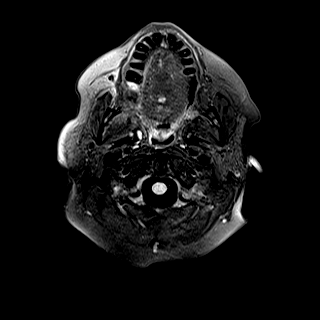

[Series 14: swi_images · axial · 1.5mm · 0.90mm/px · z∈[-69,+72]mm · 5 of 96 slices shown]
[im 1/96]
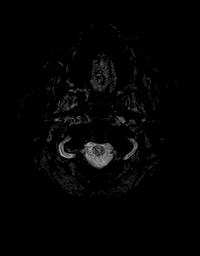
[im 24/96]
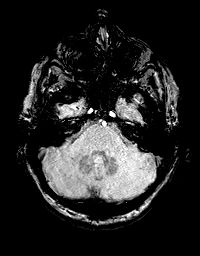
[im 48/96]
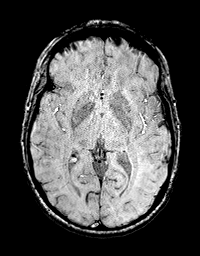
[im 72/96]
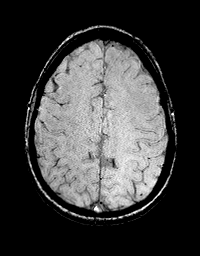
[im 96/96]
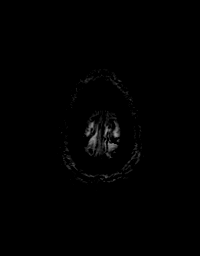

[Series 15: T1 · axial · 1.0mm · 0.94mm/px · z∈[-77,+81]mm · 8 of 160 slices shown (2 of 3)]
[im 1/160]
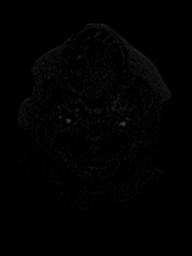
[im 23/160]
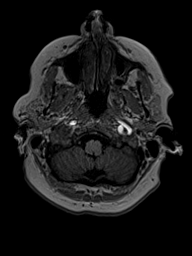
[im 46/160]
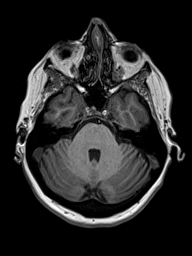
[im 69/160]
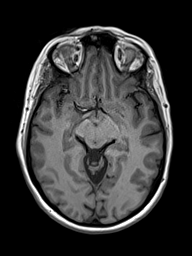
[im 91/160]
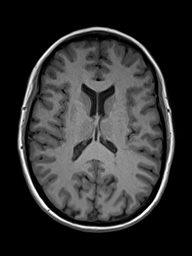
[im 114/160]
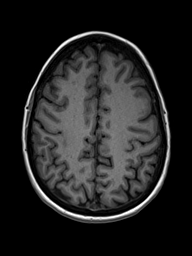
[im 137/160]
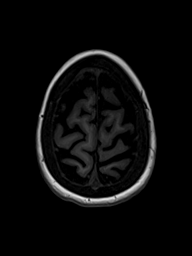
[im 160/160]
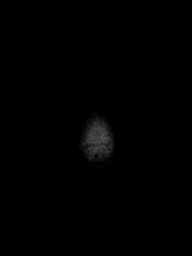

[Series 16: T2 post-contrast · coronal · 4.0mm · 0.36mm/px · 2 of 35 slices shown]
[im 1/35]
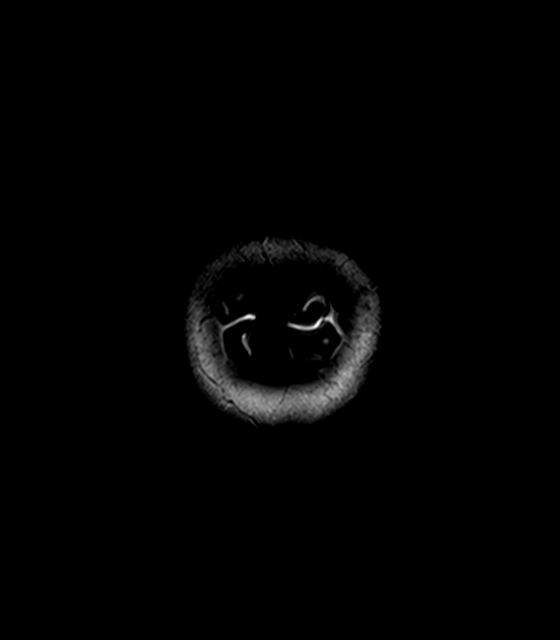
[im 35/35]
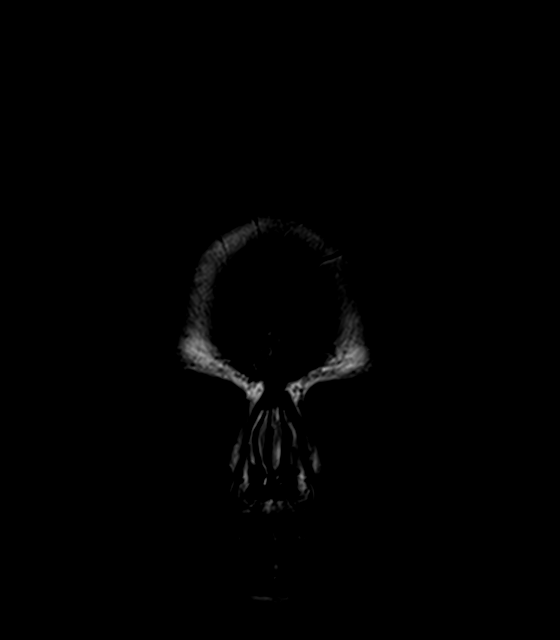

[Series 17: T1 · axial · 1.0mm · 0.94mm/px · z∈[-77,+81]mm · 8 of 160 slices shown (3 of 3)]
[im 1/160]
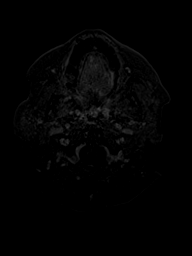
[im 23/160]
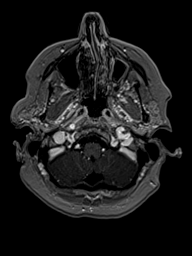
[im 46/160]
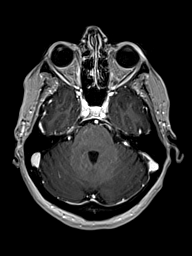
[im 69/160]
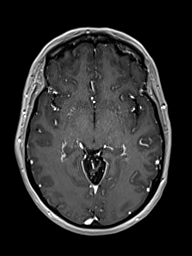
[im 91/160]
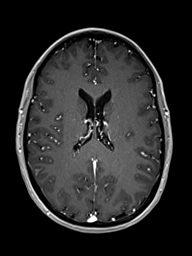
[im 114/160]
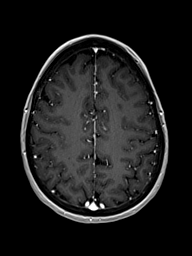
[im 137/160]
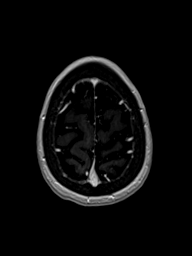
[im 160/160]
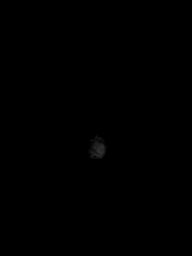

[Series 18: T1 post-contrast · coronal · 4.0mm · 0.72mm/px · 2 of 35 slices shown (1 of 3)]
[im 1/35]
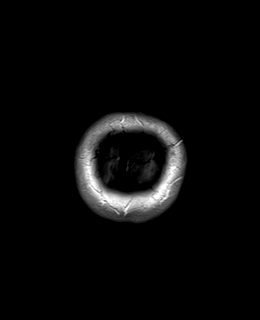
[im 35/35]
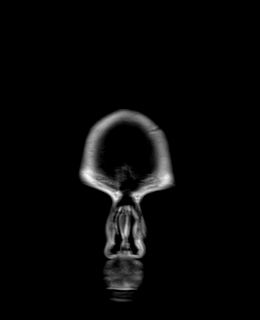

[Series 19: T1 post-contrast · sagittal · 4.0mm · 0.75mm/px · 1 of 31 slices shown (2 of 3)]
[im 1/31]
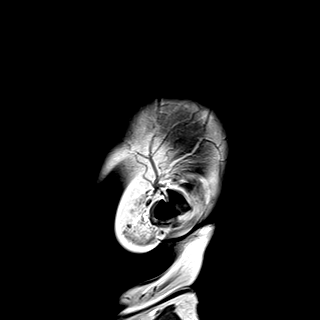

[Series 20: T1 post-contrast · coronal · 4.0mm · 0.90mm/px · 2 of 35 slices shown (3 of 3)]
[im 1/35]
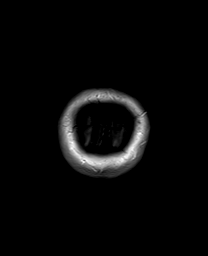
[im 35/35]
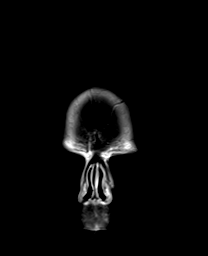

[48 of 48 positions shown; findings below may reference images not displayed]

FINDINGS: Brain: Lesion left medial parietal lobe is stable. The lesion shows
increased signal on T2 and FLAIR and involves cortex and white
matter. 5 x 10 mm lesion is present in the cortex with adjacent 3 mm
hyperintensity in the adjacent white matter. No surrounding edema.
No blood products or enhancement identified. The lesion does show
hyperintensity on diffusion-weighted imaging likely due to T2 shine
through. No restricted diffusion. DWI appearance is similar to the
prior study.

Small hyperintensities in the subcortical white matter in the right
frontal lobe measuring 3-4 mm. These are better seen on the current
study due to 3 tesla scanning. These are probably chronic. Ventricle
size, brainstem normal. No enhancing lesions.

Vascular: Normal arterial flow voids at the skull base.

Skull and upper cervical spine: No focal lesion.

Sinuses/Orbits: Mild mucosal edema paranasal sinuses. Negative orbit

Other: None
IMPRESSION: 1. Stable small hyperintensity left medial parietal lobe. No
abnormal enhancement and no interval growth. Probable benign
incidental finding. Follow-up MRI in 1 year without and with
contrast recommended for stability.
2. Small hyperintensities in the right frontal subcortical white
matter are better seen on today's study due to 3 tesla scanning.
These are likely chronic and may be due to small vessel ischemia.

## 2021-11-09 MED ORDER — GADOBENATE DIMEGLUMINE 529 MG/ML IV SOLN
20.0000 mL | Freq: Once | INTRAVENOUS | Status: AC | PRN
  Administered 2021-11-09: 20 mL via INTRAVENOUS

## 2021-11-11 ENCOUNTER — Other Ambulatory Visit: Payer: Self-pay | Admitting: Medical-Surgical

## 2021-11-19 NOTE — Progress Notes (Signed)
?  HPI with pertinent ROS:  ? ?CC: follow up ? ?HPI: ?Pleasant 56 year old female presenting today for: ? ?Hypothyroidism- taking Synthroid 133mcg daily, tolerating well without side effects. + hair loss, temperature change (warmer than usual), constipation.  ? ?Depression- taking Celexa 40mg  daily, tolerating well without side effects. Been on it long term and feels it works fairly well although some days area worse than others.  ? ?Venous insufficiency/Lymphedema- doing ok today but continues to be very bothersome, especially in the evenings after being up and moving around all day. Some days worse than others. Takes furosemide 20mg  daily prn. Wearing compression socks most of the time. ? ?GERD- taking protonix 40mg  daily, tolerating well without side effects. Working well. ? ?Polyarthralgia- cut down on Gabapentin to 100mg  twice daily to see if this will help with swelling but has seen no improvement. Now having worsened pains and experiences "shock" like sensations intermittently.  ? ?Stress incontinence- was referred to have bladder therapy but having trouble with finding child care. Taking Ditropan XL 5mg  nightly, tolerating well. Has noted an improvement in nocturia but still has some episodes of getting up to void.  ? ?Weight loss- taking Topamax 100mg  daily, tolerating well without side effects. Did take Phentermine 15mg  for a while but it was hit or miss during the taper due to pharmacy availability. ? ?I reviewed the past medical history, family history, social history, surgical history, and allergies today and no changes were needed.  Please see the problem list section below in epic for further details. ? ? ?Physical exam:  ? ?General: Well Developed, well nourished, and in no acute distress.  ?Neuro: Alert and oriented x3.  ?HEENT: Normocephalic, atraumatic.  ?Skin: Warm and dry. ?Cardiac: Regular rate and rhythm, no murmurs rubs or gallops, no lower extremity edema.  ?Respiratory: Not using accessory  muscles, speaking in full sentences. ? ?Impression and Recommendations:   ? ?1. Hypothyroidism due to Hashimoto's thyroiditis ?Checking TSH.  Continue Synthroid 125 mcg daily, titrate dose pending lab results. ?- TSH ? ?2. Major depressive disorder with single episode, in partial remission ([place]) ?Stable.  Continue Celexa 40 mg daily. ? ?3. History of gastroesophageal reflux (GERD) ?Well-controlled.  Continue Protonix 40 mg daily. ? ?4. Chronic venous insufficiency ?5. Lymphedema ?Continue conservative measures including compression stockings and sodium restriction.  Stay well-hydrated. ? ?6. POLYARTHRALGIA ?With no improvement in swelling with the reduction of gabapentin dose, increase back to 300 mg twice daily. ? ?7. SUI (stress urinary incontinence, female) ?Has seen some benefit from Ditropan 5 mg of increasing to 10 mg nightly.  Reviewed side effects to monitor for and if not tolerated, we can always go back to 5 mg nightly. ? ?8. Encounter for weight management ?Continue 100 mg Topamax daily.  We will take a couple of months off of phentermine completely and then reevaluate. ? ?9. Peripheral neuropathy, idiopathic ?Checking hemoglobin A1c. ?- Hemoglobin A1c ? ?10. Preventative health care ?Labs as below. ?- CBC with Differential ?- COMPLETE METABOLIC PANEL WITH GFR ?- Lipid panel ? ?Return in about 2 months (around 01/20/2022) for weight check (possibly restart phentermine). ?___________________________________________ ?Barbara Sorrel, DNP, APRN, FNP-BC ?Primary Care and Sports Medicine ?[place] ?

## 2021-11-20 ENCOUNTER — Ambulatory Visit (INDEPENDENT_AMBULATORY_CARE_PROVIDER_SITE_OTHER): Payer: Medicare Other | Admitting: Medical-Surgical

## 2021-11-20 ENCOUNTER — Encounter: Payer: Self-pay | Admitting: Medical-Surgical

## 2021-11-20 VITALS — BP 114/78 | HR 62 | Resp 16 | Ht 65.0 in | Wt 237.0 lb

## 2021-11-20 DIAGNOSIS — M2550 Pain in unspecified joint: Secondary | ICD-10-CM

## 2021-11-20 DIAGNOSIS — F324 Major depressive disorder, single episode, in partial remission: Secondary | ICD-10-CM | POA: Diagnosis not present

## 2021-11-20 DIAGNOSIS — I872 Venous insufficiency (chronic) (peripheral): Secondary | ICD-10-CM | POA: Diagnosis not present

## 2021-11-20 DIAGNOSIS — Z0000 Encounter for general adult medical examination without abnormal findings: Secondary | ICD-10-CM | POA: Diagnosis not present

## 2021-11-20 DIAGNOSIS — Z7689 Persons encountering health services in other specified circumstances: Secondary | ICD-10-CM

## 2021-11-20 DIAGNOSIS — E038 Other specified hypothyroidism: Secondary | ICD-10-CM

## 2021-11-20 DIAGNOSIS — N393 Stress incontinence (female) (male): Secondary | ICD-10-CM

## 2021-11-20 DIAGNOSIS — Z8719 Personal history of other diseases of the digestive system: Secondary | ICD-10-CM

## 2021-11-20 DIAGNOSIS — I890 Lymphedema, not elsewhere classified: Secondary | ICD-10-CM | POA: Diagnosis not present

## 2021-11-20 DIAGNOSIS — R7309 Other abnormal glucose: Secondary | ICD-10-CM | POA: Diagnosis not present

## 2021-11-20 DIAGNOSIS — E063 Autoimmune thyroiditis: Secondary | ICD-10-CM | POA: Diagnosis not present

## 2021-11-20 DIAGNOSIS — G609 Hereditary and idiopathic neuropathy, unspecified: Secondary | ICD-10-CM

## 2021-11-20 MED ORDER — OXYBUTYNIN CHLORIDE ER 10 MG PO TB24: 10.0000 mg | ORAL_TABLET | Freq: Every day | ORAL | 1 refills | Status: DC

## 2021-11-21 LAB — COMPLETE METABOLIC PANEL WITH GFR
AG Ratio: 1.7 (calc) (ref 1.0–2.5)
ALT: 14 U/L (ref 6–29)
AST: 13 U/L (ref 10–35)
Albumin: 4.3 g/dL (ref 3.6–5.1)
Alkaline phosphatase (APISO): 77 U/L (ref 37–153)
BUN: 24 mg/dL (ref 7–25)
CO2: 24 mmol/L (ref 20–32)
Calcium: 9.5 mg/dL (ref 8.6–10.4)
Chloride: 107 mmol/L (ref 98–110)
Creat: 0.95 mg/dL (ref 0.50–1.03)
Globulin: 2.5 g/dL (calc) (ref 1.9–3.7)
Glucose, Bld: 92 mg/dL (ref 65–99)
Potassium: 4.4 mmol/L (ref 3.5–5.3)
Sodium: 141 mmol/L (ref 135–146)
Total Bilirubin: 0.7 mg/dL (ref 0.2–1.2)
Total Protein: 6.8 g/dL (ref 6.1–8.1)
eGFR: 70 mL/min/{1.73_m2} (ref >=60)

## 2021-11-21 LAB — CBC WITH DIFFERENTIAL/PLATELET
Absolute Monocytes: 482 cells/uL (ref 200–950)
Basophils Absolute: 28 cells/uL (ref 0–200)
Basophils Relative: 0.5 %
Eosinophils Absolute: 174 cells/uL (ref 15–500)
Eosinophils Relative: 3.1 %
HCT: 42.4 % (ref 35.0–45.0)
Hemoglobin: 14 g/dL (ref 11.7–15.5)
Lymphs Abs: 1294 cells/uL (ref 850–3900)
MCH: 31 pg (ref 27.0–33.0)
MCHC: 33 g/dL (ref 32.0–36.0)
MCV: 93.8 fL (ref 80.0–100.0)
MPV: 10.3 fL (ref 7.5–12.5)
Monocytes Relative: 8.6 %
Neutro Abs: 3623 cells/uL (ref 1500–7800)
Neutrophils Relative %: 64.7 %
Platelets: 256 10*3/uL (ref 140–400)
RBC: 4.52 10*6/uL (ref 3.80–5.10)
RDW: 12.4 % (ref 11.0–15.0)
Total Lymphocyte: 23.1 %
WBC: 5.6 10*3/uL (ref 3.8–10.8)

## 2021-11-21 LAB — HEMOGLOBIN A1C
Hgb A1c MFr Bld: 5.4 % of total Hgb (ref <5.7)
Mean Plasma Glucose: 108 mg/dL
eAG (mmol/L): 6 mmol/L

## 2021-11-21 LAB — LIPID PANEL
Cholesterol: 231 mg/dL — ABNORMAL HIGH (ref <200)
HDL: 68 mg/dL (ref >=50)
LDL Cholesterol (Calc): 144 mg/dL (calc) — ABNORMAL HIGH
Non-HDL Cholesterol (Calc): 163 mg/dL (calc) — ABNORMAL HIGH (ref <130)
Total CHOL/HDL Ratio: 3.4 (calc) (ref <5.0)
Triglycerides: 82 mg/dL (ref <150)

## 2021-11-21 LAB — TSH: TSH: 0.8 mIU/L (ref 0.40–4.50)

## 2021-11-22 ENCOUNTER — Encounter: Payer: Self-pay | Admitting: Medical-Surgical

## 2021-11-22 DIAGNOSIS — I890 Lymphedema, not elsewhere classified: Secondary | ICD-10-CM

## 2021-12-07 ENCOUNTER — Encounter: Payer: Self-pay | Admitting: Medical-Surgical

## 2021-12-08 ENCOUNTER — Other Ambulatory Visit: Payer: Self-pay | Admitting: Medical-Surgical

## 2021-12-11 DIAGNOSIS — I890 Lymphedema, not elsewhere classified: Secondary | ICD-10-CM | POA: Diagnosis not present

## 2021-12-12 ENCOUNTER — Ambulatory Visit (INDEPENDENT_AMBULATORY_CARE_PROVIDER_SITE_OTHER): Payer: Medicare Other | Admitting: Sports Medicine

## 2021-12-12 ENCOUNTER — Ambulatory Visit (INDEPENDENT_AMBULATORY_CARE_PROVIDER_SITE_OTHER): Payer: Medicare Other

## 2021-12-12 DIAGNOSIS — M1711 Unilateral primary osteoarthritis, right knee: Secondary | ICD-10-CM | POA: Insufficient documentation

## 2021-12-12 DIAGNOSIS — M25561 Pain in right knee: Secondary | ICD-10-CM | POA: Diagnosis not present

## 2021-12-12 DIAGNOSIS — Z09 Encounter for follow-up examination after completed treatment for conditions other than malignant neoplasm: Secondary | ICD-10-CM | POA: Diagnosis not present

## 2021-12-12 DIAGNOSIS — M19072 Primary osteoarthritis, left ankle and foot: Secondary | ICD-10-CM

## 2021-12-12 IMAGING — DX DG KNEE 1-2V*L*
2 series · 2 of 2 positions shown · non-contrast
Comparison: Right knee radiographs [DATE]

CLINICAL DATA: Severe right knee pain. Severe right knee pain.
Primary osteoarthritis of right knee.

EXAM:
RIGHT KNEE - COMPLETE 4+ VIEW; LEFT KNEE - 1-2 VIEW

[knee ap bilat standing (1 of 2)]
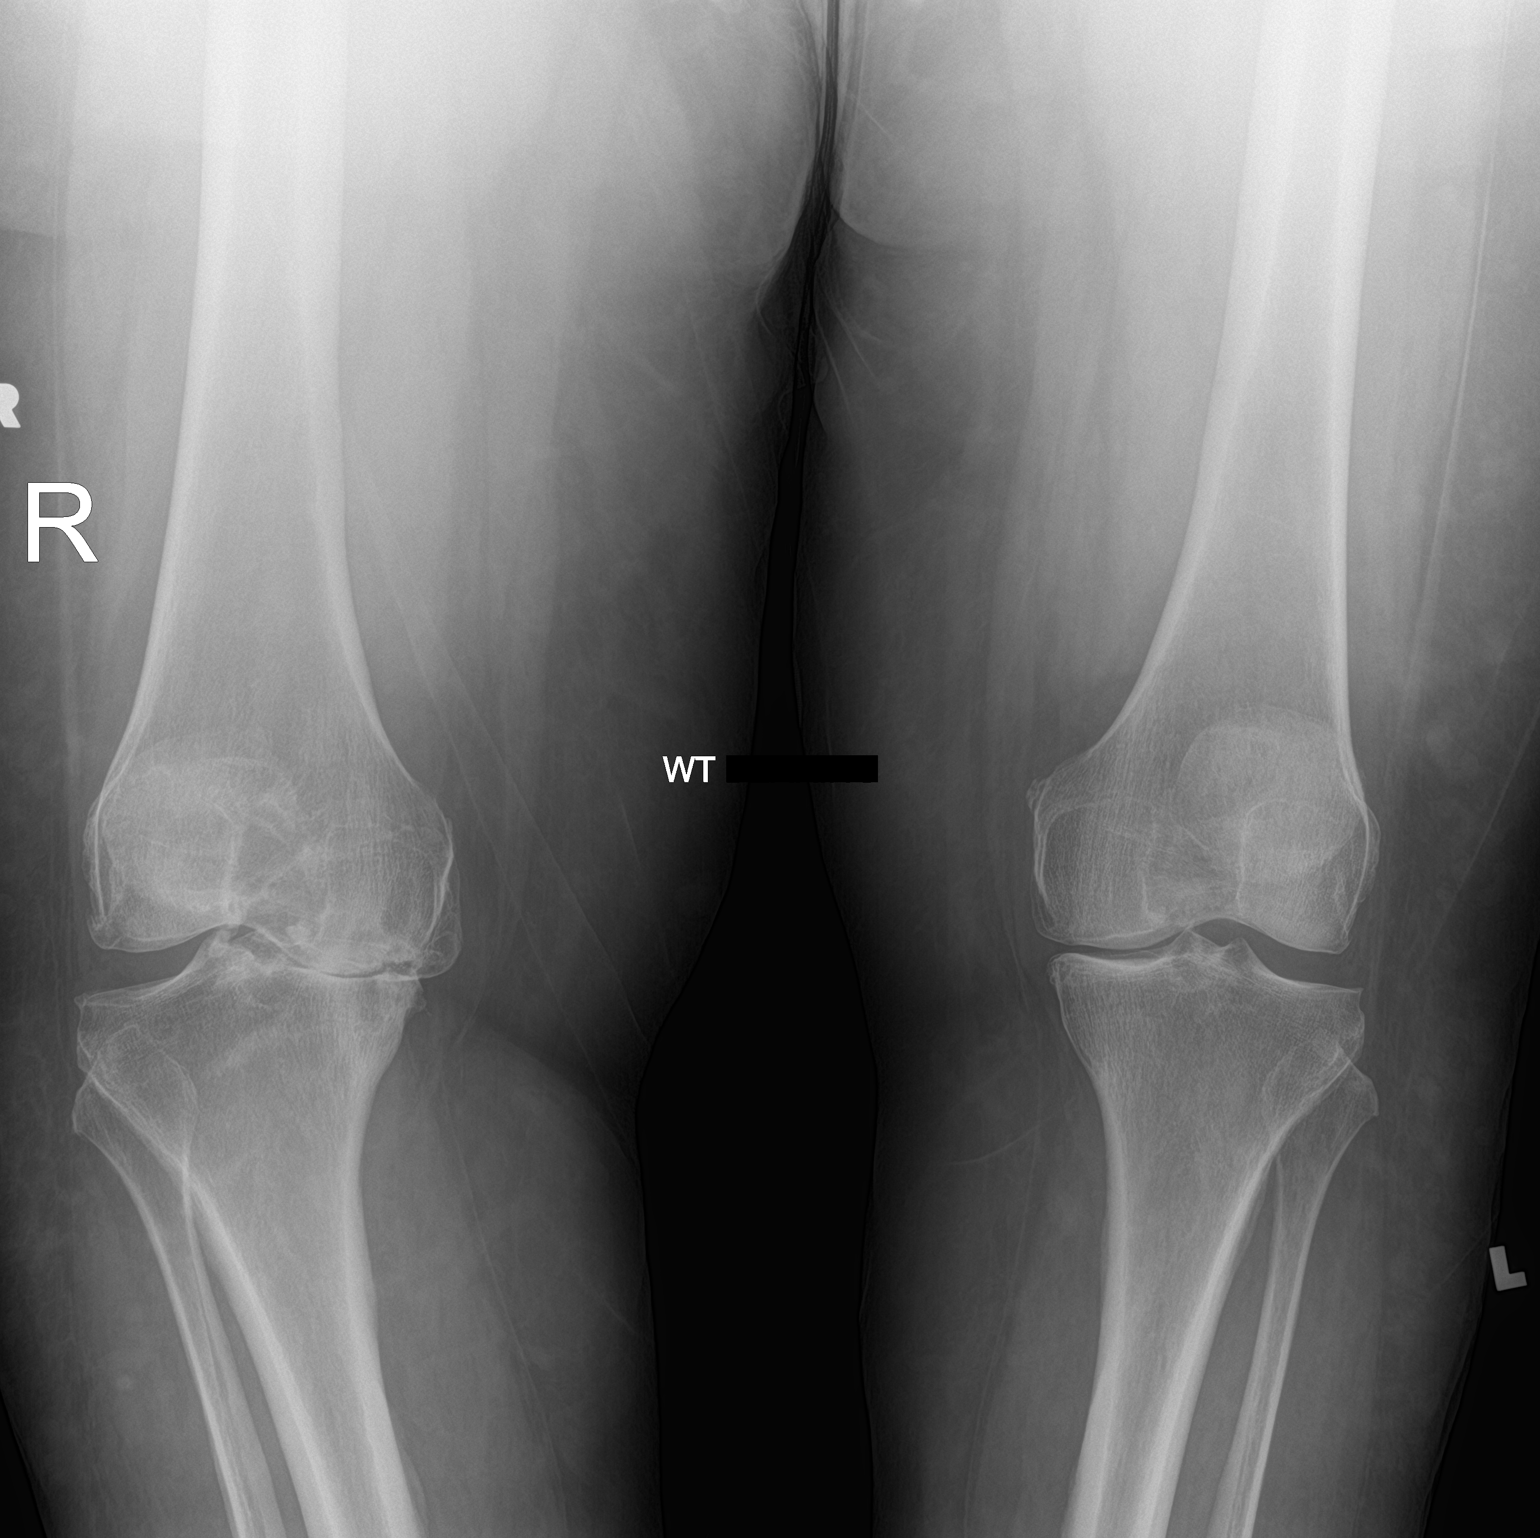

[knee ap bilat standing (2 of 2)]
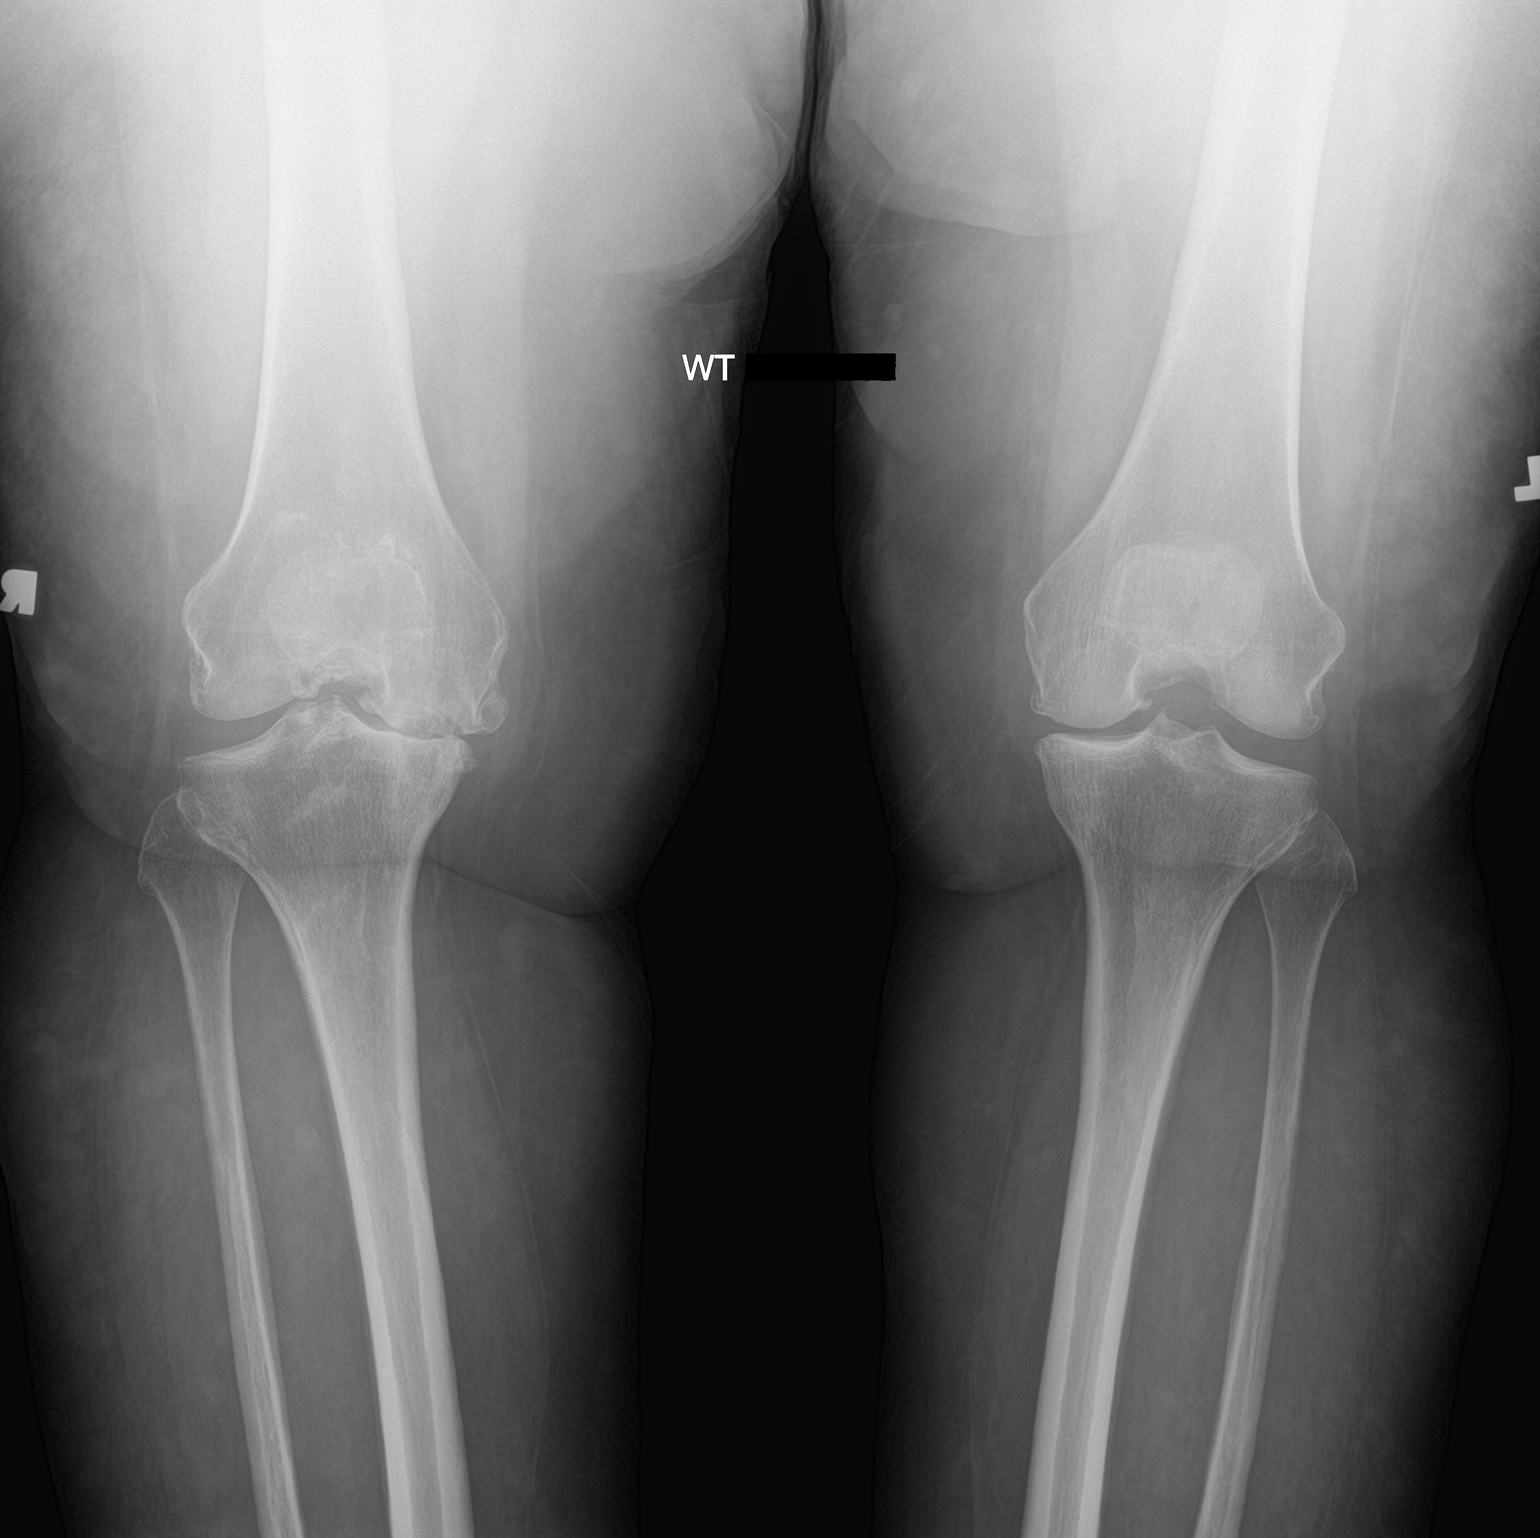

[2 of 2 positions shown; findings below may reference images not displayed]

FINDINGS: Severe medial compartment joint space narrowing and bone-on-bone
contact. Mild-to-moderate varus angulation is mildly worsened from
prior. Moderate to high-grade peripheral medial compartment
degenerative osteophytes. Severe patellofemoral joint space
narrowing and peripheral osteophytosis.

Left knee:

PA and AP views. Moderate medial compartment joint space narrowing
with mild peripheral degenerative osteophytes.
IMPRESSION: 1. Right knee severe medial greater than patellofemoral compartment
osteoarthritis. Mild-to-moderate varus angulation is mildly worsened
from prior.
2. Left knee moderate medial compartment joint space narrowing.

## 2021-12-12 IMAGING — DX DG KNEE COMPLETE 4+V*R*
4 series · 4 of 4 positions shown · non-contrast
Comparison: Right knee radiographs [DATE]

CLINICAL DATA: Severe right knee pain. Severe right knee pain.
Primary osteoarthritis of right knee.

EXAM:
RIGHT KNEE - COMPLETE 4+ VIEW; LEFT KNEE - 1-2 VIEW

[knee lat]
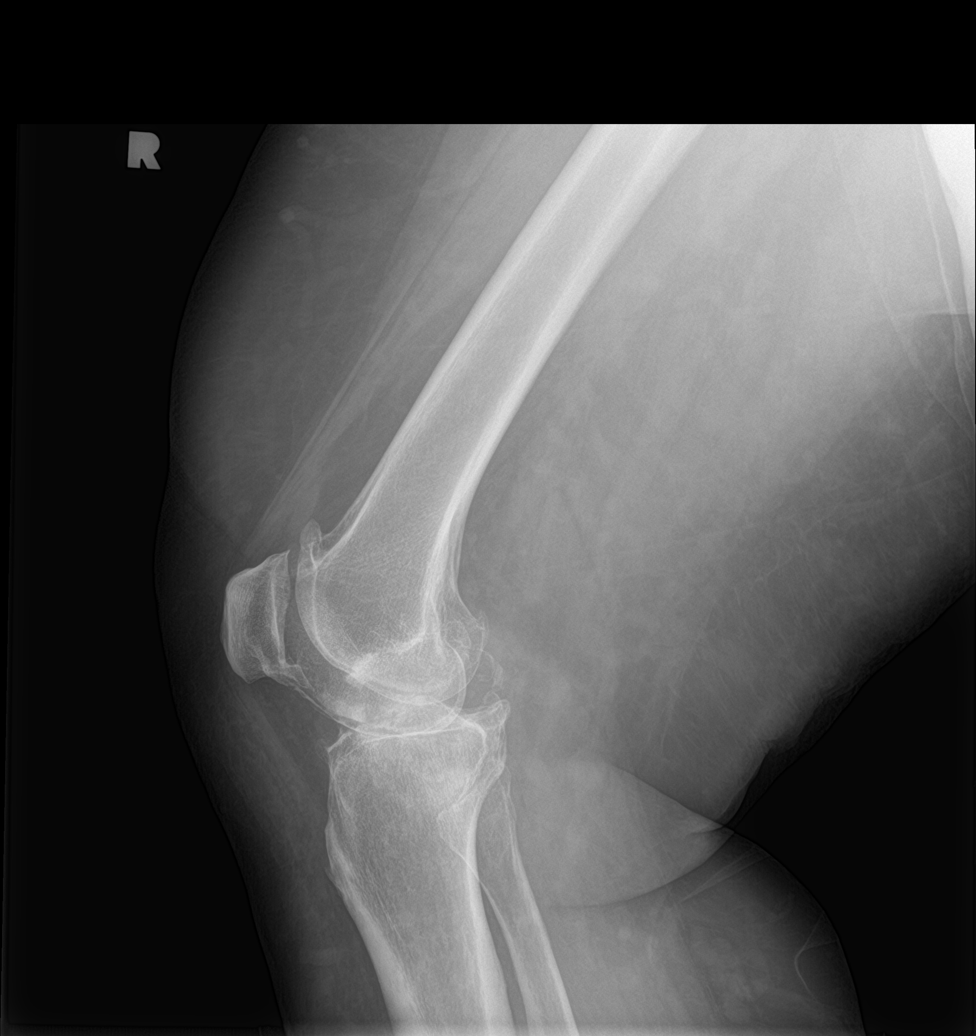

[knee ap bilat standing (1 of 2)]
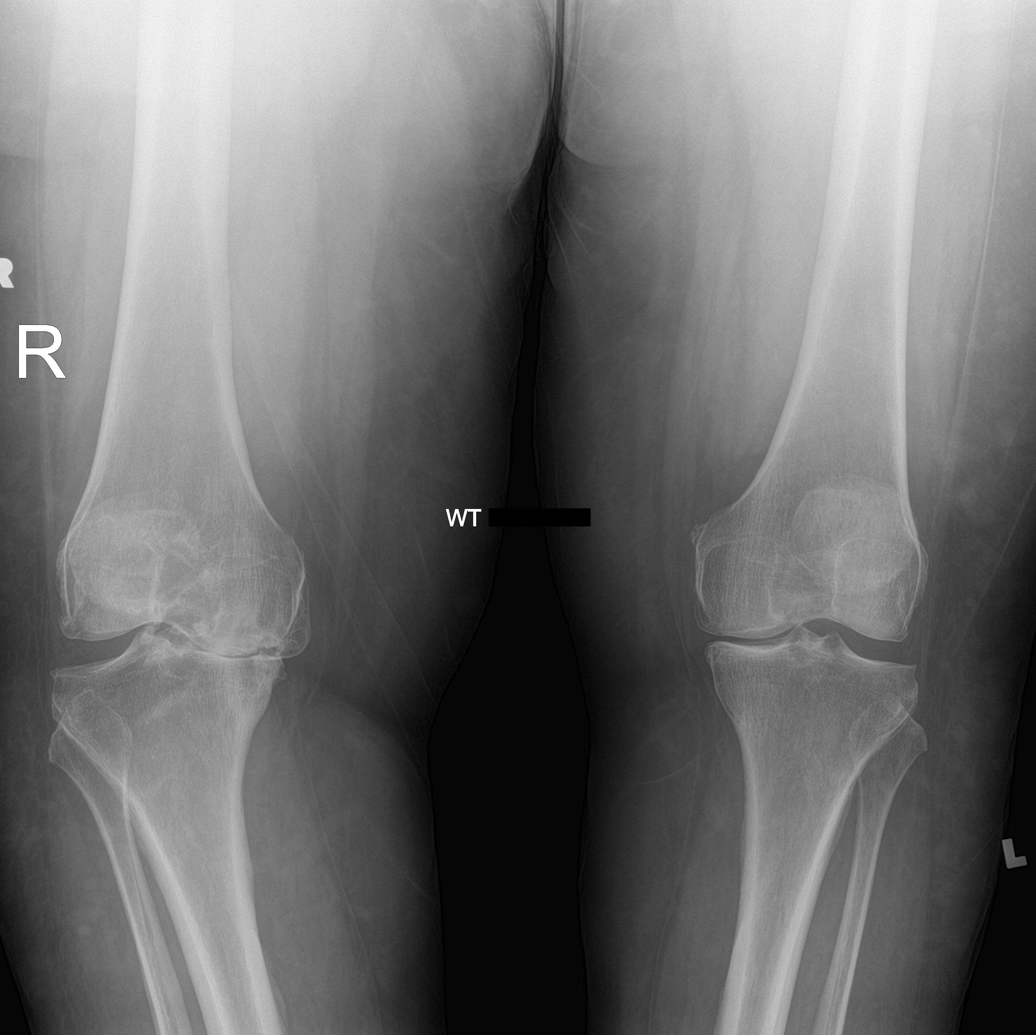

[knee sunrise standing]
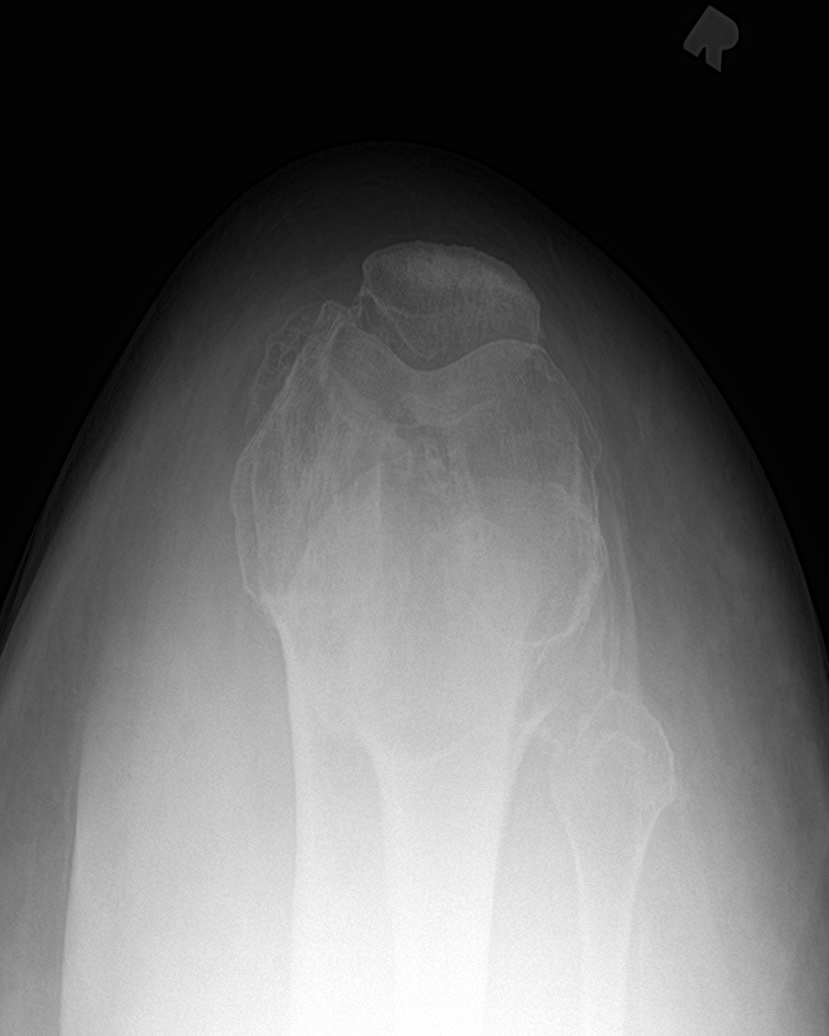

[knee ap bilat standing (2 of 2)]
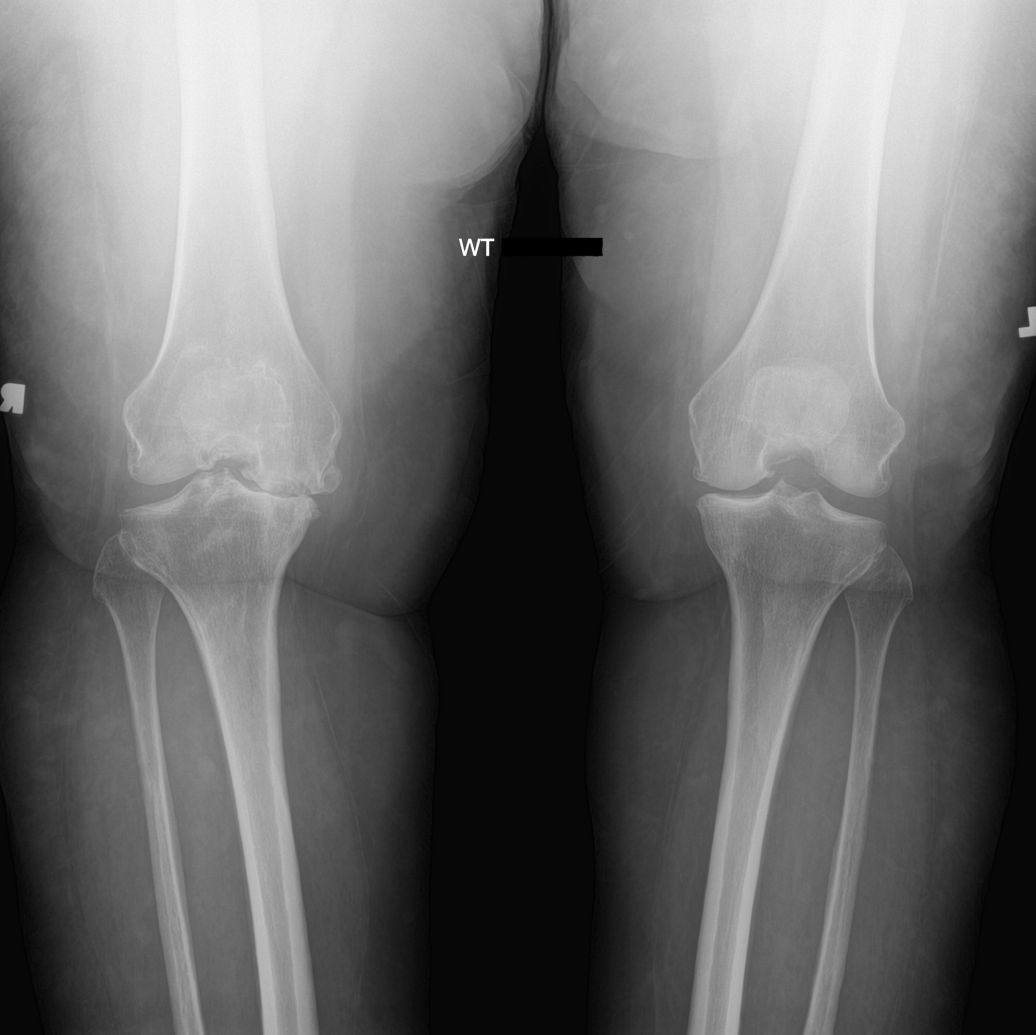

[4 of 4 positions shown; findings below may reference images not displayed]

FINDINGS: Severe medial compartment joint space narrowing and bone-on-bone
contact. Mild-to-moderate varus angulation is mildly worsened from
prior. Moderate to high-grade peripheral medial compartment
degenerative osteophytes. Severe patellofemoral joint space
narrowing and peripheral osteophytosis.

Left knee:

PA and AP views. Moderate medial compartment joint space narrowing
with mild peripheral degenerative osteophytes.
IMPRESSION: 1. Right knee severe medial greater than patellofemoral compartment
osteoarthritis. Mild-to-moderate varus angulation is mildly worsened
from prior.
2. Left knee moderate medial compartment joint space narrowing.

## 2021-12-12 MED ORDER — MELOXICAM 15 MG PO TABS: ORAL_TABLET | ORAL | 3 refills | Status: DC

## 2021-12-12 NOTE — Progress Notes (Unsigned)
NEUROLOGY CONSULTATION NOTE  Barbara Calderon MRN: [id number] DOB: 1965/12/30  Referring provider: Samuel Bouche, NP Primary care provider: Samuel Bouche, NP  Reason for consult:  ataxia  Assessment/Plan:   Abnormal brain MRI - findings suggestive of cortical dysplasia vs hamartoma - both benign processes and would not explain patient's symptoms Memory deficits - consider side effect of topiramate.  Also consider sequela of OSA.  I do not think this represents a neurodegenerative disease. Morning headaches - reports daytime fatigue.  She has diagnosis of mild OSA - would evaluate if it has since worsened.   Gait instability - likely related to her osteoarthritis   1  Would repeat MRI of brain with and without contrast in one year 2  Refer to sleep medicine for re-evaluation of sleep apnea and whether it has progressed to now requiring a CPAP. 3  To address memory problems:  - taper off topiramate  - She will see her PCP about checking a B12 level.  Recent TSH level normal.  - Evaluate for worsening OSA 4  Follow up in one year (after repeat MRI) or sooner if needed.   Subjective:  Barbara Calderon is a 56 year old female with acquired hypothyroidism, migraines, HLD, asthma and depression who presents for second opinion regarding ataxia.  History supplemented by neurology and PCP's notes.  In 2015, she began having right upper thigh pain and numbness and left arm numbness.  Around this same time, she had transient episode of gait instability.  She was seen by a neuromuscular specialist at [facility] who suspected right meralgia paresthetica, eft carpal tunnel syndrome and restless leg syndrome. NCV-EMG performed was reportedly normal.  Symptoms subsequently evolved to tingling and weakness of both arms and polyarthralgia.  Mild neck pain.  Previous labs revealed positive ANA and elevated CRP of 11.2 but other autoimmune labs, such as ESR, RA and ENA panel, were negative.  CK was normal at  73.  Treated by rheumatology for fibromyalgia.Returned to see a different neurologist at [facility] in 2020.  NCV-EMG was again ordered but was never perfored. She then started to endorse low back pain radiating down both legs with associated weakness, numbness and swelling as well as iincreased urinary frequency but no retention or incontinence.    She has history of gait instability as well as numbness and tingling of extremities.  Previously seen by neurology at [facility] in 2015 and 2020.  NCV-EMG reportedly negative.  Autoimmune labs unremarkable except for positive ANA.  Was previously seen by rheumatology for fibromyalgia.  In 2022, she started having worsening gait instability.  She has osteoarthritis particularly severe in the right knee and left ankle.  She also has lymphedema of the left lower extremity.  She started having short term memory problems such as frequently repeating questions, misplacing items and forgetting recipes.  Her paternal grandmother had Alzheimer's late in life.  She had an MRI of the brain with and without contrast on 05/16/2021 which was personally reviewed and showed small hyperintensity in left medial parietal lobe thought to possibly be small dysplasia or hamartoma.  In December 2022 she began waking up with left sided pressure headaches, no associated symptoms.  They would resolve later in the day.  Initially daily, now a couple of days a week.  She was previously diagnosed with mild OSA not requiring CPAP.  She saw Dr. Krista Blue at [facility].  Patient was taking topiramate to help with weight loss and dose was  increased to address headaches.  Repeat MRI of brain with and without contrast on 11/09/2021 personally reviewed was unchanged.  Most recent labs from 11/20/2021 include Hgb A1c 5.4.  She has hypothyroidism that is now adequately treated.  TSH from 11/20/2021 was 0.80. Iron panel and vit D level from last year were normal.     Imaging: 05/16/2021 MRI  BRAIN W WO:  No acute or reversible finding. No evidence of demyelinating disease.  Subcentimeter focus of T2 and FLAIR signal within the medial left posterior parietal cortex. Favored diagnosis is small dysplasia or hamartoma. No abnormal enhancement, mass effect, edema or hemorrhage. I think this is unlikely to be clinically significant.  However, stability should be established. Initial follow-up exam suggested in 3-6 months. 11/09/2021 MRI BRAIN W WO:  1. Stable small hyperintensity left medial parietal lobe. No abnormal enhancement and no interval growth. Probable benign incidental finding. Follow-up MRI in 1 year without and with contrast recommended for stability. 2. Small hyperintensities in the right frontal subcortical white matter are better seen on today's study due to 3 tesla scanning.  These are likely chronic and may be due to small vessel ischemia.     PAST MEDICAL HISTORY: Past Medical History:  Diagnosis Date   Abnormal mammogram    Abnormal Pap smear of cervix    Anxiety    Arthritis    Asthma    Depression    GERD (gastroesophageal reflux disease)    Hashimoto's disease    Heart murmur    Hyperlipidemia    borderline- no meds   Lymphedema    Lymphedema    bilateral legs   Neuromuscular disorder (HCC)    fibro many years ago told this, neurpathy legs   PONV (postoperative nausea and vomiting)    Sleep apnea    no CPAP   Thyroid disease    Hashimotos   Varicose veins     PAST SURGICAL HISTORY: Past Surgical History:  Procedure Laterality Date   ABDOMINAL HYSTERECTOMY     partial   APPLICATION OF WOUND VAC     on left lower leg above foot   BLADDER SUSPENSION     CESAREAN SECTION     x2   CHOLECYSTECTOMY OPEN     INCISION AND DRAINAGE OF WOUND     PARTIAL HYSTERECTOMY     SKIN GRAFT     TUBAL LIGATION      MEDICATIONS: Current Outpatient Medications on File Prior to Visit  Medication Sig Dispense Refill   albuterol (VENTOLIN HFA) 108 (90 Base)  MCG/ACT inhaler USE 2 INHALATIONS BY MOUTH  EVERY 4 HOURS AS NEEDED FOR WHEEZING 51 g 3   AMBULATORY NON FORMULARY MEDICATION Knee-high, medium compression, graduated compression stockings. Apply to lower extremities. [email or web address], Zippered Compression Stockings, medium circ, long length 1 each 0   aspirin EC 81 MG tablet Take 81 mg by mouth daily. Swallow whole.     citalopram (CELEXA) 40 MG tablet TAKE 1 TABLET BY MOUTH  DAILY 90 tablet 0   diclofenac (VOLTAREN) 75 MG EC tablet TAKE 1 TABLET BY MOUTH  TWICE DAILY 180 tablet 3   furosemide (LASIX) 20 MG tablet Take 1 tablet by mouth daily. PRN     gabapentin (NEURONTIN) 100 MG capsule Take 1 capsule (100 mg total) by mouth daily. 90 capsule 90   gabapentin (NEURONTIN) 300 MG capsule TAKE 1 CAPSULE BY MOUTH IN  THE EVENING 90 capsule 3   oxybutynin (DITROPAN XL) 10 MG 24 hr tablet  Take 1 tablet (10 mg total) by mouth at bedtime. 90 tablet 1   pantoprazole (PROTONIX) 40 MG tablet TAKE 1 TABLET BY MOUTH  DAILY 90 tablet 3   phentermine 15 MG capsule Take 1 capsule (15 mg) daily x4 weeks then decrease to every other day x2 weeks then twice weekly x2 weeks.  Then stop. (Patient not taking: Reported on 11/20/2021) 60 capsule 0   potassium chloride SA (KLOR-CON) 20 MEQ tablet Take 1 tablet by mouth daily as needed. ONLY ON THE DAYS OF TAKING LASIX     SUMAtriptan (IMITREX) 25 MG tablet Take 1 tablet (25 mg total) by mouth every 2 (two) hours as needed for migraine. May repeat in 2 hours if headache persists or recurs. 12 tablet 11   SYNTHROID 125 MCG tablet TAKE 1 TABLET BY MOUTH DAILY  BEFORE BREAKFAST 90 tablet 0   topiramate (TOPAMAX) 100 MG tablet Take 1 tablet (100 mg total) by mouth 2 (two) times daily. 180 tablet 3   traMADol (ULTRAM) 50 MG tablet TAKE 1 TABLET BY MOUTH  EVERY 6 HOURS AS NEEDED 60 tablet 0   No current facility-administered medications on file prior to visit.    ALLERGIES: Allergies  Allergen Reactions    Hydrocodone-Acetaminophen Hives and Itching   Latex Rash    FAMILY HISTORY: Family History  Problem Relation Age of Onset   Colon polyps Mother    Stroke Mother    Hypertension Mother    Colon polyps Father    Prostate cancer Father    Colon cancer Neg Hx    Esophageal cancer Neg Hx    Stomach cancer Neg Hx    Rectal cancer Neg Hx     Objective:  Blood pressure 107/71, pulse (!) 59, height '5\' 6"'  (1.676 m), weight 236 lb (107 kg), SpO2 100 %. General: No acute distress.  Patient appears well-groomed.   Head:  Normocephalic/atraumatic Eyes:  fundi examined but not visualized Neck: supple, no paraspinal tenderness, full range of motion Back: No paraspinal tenderness Heart: regular rate and rhythm Lungs: Clear to auscultation bilaterally. Neurological Exam: Mental status: alert and oriented to person, place, and time, recent and remote memory intact, fund of knowledge intact, attention and concentration intact, speech fluent and not dysarthric, language intact. Cranial nerves: CN I: not tested CN II: pupils equal, round and reactive to light, visual fields intact CN III, IV, VI:  full range of motion, no nystagmus, no ptosis CN V: facial sensation intact. CN VII: upper and lower face symmetric CN VIII: hearing intact CN IX, X: gag intact, uvula midline CN XI: sternocleidomastoid and trapezius muscles intact CN XII: tongue midline Bulk & Tone: normal, no fasciculations. Motor:  muscle strength 5/5 throughout Sensation:  Pinprick, temperature and vibratory sensation intact. Deep Tendon Reflexes:  2+ throughout,  toes downgoing.   Finger to nose testing:  Without dysmetria.   Heel to shin:  Without dysmetria.   Gait:  Antalgic gait/right limp  Romberg negative.    Thank you for allowing me to take part in the care of this patient.  Metta Clines, DO  CC: Samuel Bouche, NP

## 2021-12-12 NOTE — Assessment & Plan Note (Signed)
Has lost some weight but really needs to lose 75-100 more pounds, this would probably save her knees, ankles. Lymphedema would likely improve considerably/resolved. I did plant the seed of considering bariatric surgery which I would fully support for this.

## 2021-12-12 NOTE — Assessment & Plan Note (Signed)
Right knee osteoarthritis, 5 degrees of extension lag. X-rays, meloxicam as below, tramadol. Home condition and given, return to see me in 2 to 3 weeks, injection if not better.

## 2021-12-12 NOTE — Assessment & Plan Note (Signed)
Pleasant 56 year old female, morbidly obese, significant pain medial ankle with paresthesias into the plantar foot, x-rays did show severe ankle osteoarthritis, as well as subtalar osteoarthritis. I think the paresthesias are due to tarsal tunnel syndrome, she does have a positive tarsal tunnel Tinel's sign. We will start with an over-the-counter ASO, switching from diclofenac to meloxicam, tramadol for breakthrough pain, home conditioning, turn to see me in about 2 to 3 weeks and we will consider ankle joint injection if not better.

## 2021-12-12 NOTE — Progress Notes (Signed)
    Procedures performed today:    None.  Independent interpretation of notes and tests performed by another provider:   None.  Brief History, Exam, Impression, and Recommendations:    Primary osteoarthritis of left ankle Pleasant 56 year old female, morbidly obese, significant pain medial ankle with paresthesias into the plantar foot, x-rays did show severe ankle osteoarthritis, as well as subtalar osteoarthritis. I think the paresthesias are due to tarsal tunnel syndrome, she does have a positive tarsal tunnel Tinel's sign. We will start with an over-the-counter ASO, switching from diclofenac to meloxicam, tramadol for breakthrough pain, home conditioning, turn to see me in about 2 to 3 weeks and we will consider ankle joint injection if not better.  Primary osteoarthritis of right knee Right knee osteoarthritis, 5 degrees of extension lag. X-rays, meloxicam as below, tramadol. Home condition and given, return to see me in 2 to 3 weeks, injection if not better.  Obesity, morbid, BMI 40.0-49.9 (HCC) Has lost some weight but really needs to lose 75-100 more pounds, this would probably save her knees, ankles. Lymphedema would likely improve considerably/resolved. I did plant the seed of considering bariatric surgery which I would fully support for this.    ___________________________________________ Ihor Austin. Benjamin Stain, M.D., ABFM., CAQSM. Primary Care and Sports Medicine [facility]  Adjunct Instructor of Family Medicine  [employer]

## 2021-12-13 ENCOUNTER — Encounter: Payer: Self-pay | Admitting: Neurology

## 2021-12-13 ENCOUNTER — Ambulatory Visit: Payer: Medicare Other | Admitting: Neurology

## 2021-12-13 VITALS — BP 107/71 | HR 59 | Ht 66.0 in | Wt 236.0 lb

## 2021-12-13 DIAGNOSIS — Q048 Other specified congenital malformations of brain: Secondary | ICD-10-CM

## 2021-12-13 DIAGNOSIS — M1711 Unilateral primary osteoarthritis, right knee: Secondary | ICD-10-CM | POA: Diagnosis not present

## 2021-12-13 DIAGNOSIS — R2681 Unsteadiness on feet: Secondary | ICD-10-CM | POA: Diagnosis not present

## 2021-12-13 DIAGNOSIS — I890 Lymphedema, not elsewhere classified: Secondary | ICD-10-CM

## 2021-12-13 DIAGNOSIS — R413 Other amnesia: Secondary | ICD-10-CM

## 2021-12-13 DIAGNOSIS — G4733 Obstructive sleep apnea (adult) (pediatric): Secondary | ICD-10-CM | POA: Diagnosis not present

## 2021-12-13 NOTE — Patient Instructions (Addendum)
The findings on brain MRI are benign.  Nothing to worry about.  I would like to repeat MRI of brain with and without contrast in one year to follow up. Memory problems: May be due to topiramate - take 50mg  (1/2 tablet) twice daily for one week, then 50mg  at bedtime for one week, then stop Follow up with your PCP and have a B12 level checked Sleep apnea may cause memory problems (and morning headache).  Will refer you to sleep specialist Refer to sleep medicine for re-evaluation of sleep apnea Follow up in one year (after repeat MRI) or sooner if needed.

## 2021-12-14 ENCOUNTER — Ambulatory Visit: Payer: Medicare Other

## 2021-12-24 ENCOUNTER — Other Ambulatory Visit: Payer: Self-pay | Admitting: Medical-Surgical

## 2021-12-27 ENCOUNTER — Ambulatory Visit (INDEPENDENT_AMBULATORY_CARE_PROVIDER_SITE_OTHER): Payer: Medicare Other

## 2021-12-27 DIAGNOSIS — Z1231 Encounter for screening mammogram for malignant neoplasm of breast: Secondary | ICD-10-CM

## 2021-12-27 DIAGNOSIS — Z01419 Encounter for gynecological examination (general) (routine) without abnormal findings: Secondary | ICD-10-CM

## 2021-12-27 IMAGING — MG MM DIGITAL SCREENING BILAT W/ TOMO AND CAD
8 series · 8 of 24 positions shown · non-contrast
Comparison: Previous exam(s).

CLINICAL DATA: Screening.

EXAM:
DIGITAL SCREENING BILATERAL MAMMOGRAM WITH TOMOSYNTHESIS AND CAD
TECHNIQUE: Bilateral screening digital craniocaudal and mediolateral oblique
mammograms were obtained. Bilateral screening digital breast
tomosynthesis was performed. The images were evaluated with
computer-aided detection.

[R MLO synth-2D]
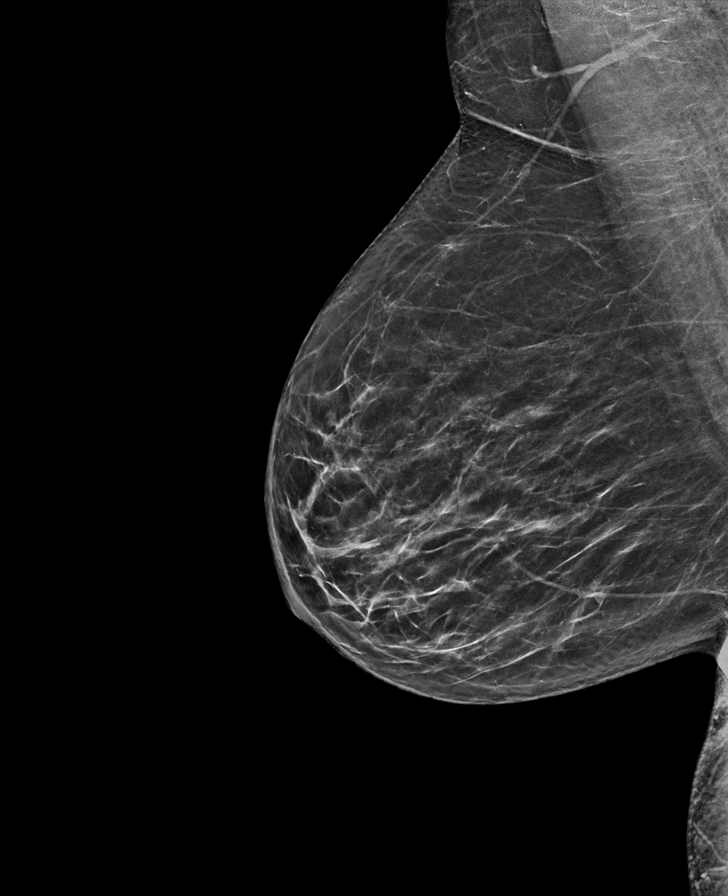

[L CC synth-2D]
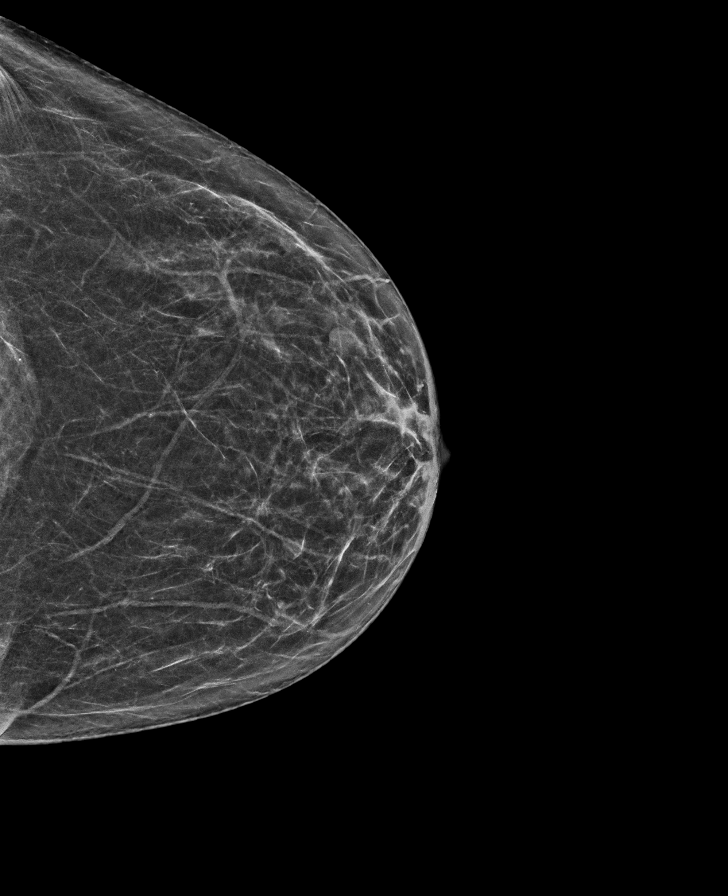

[R CC synth-2D]
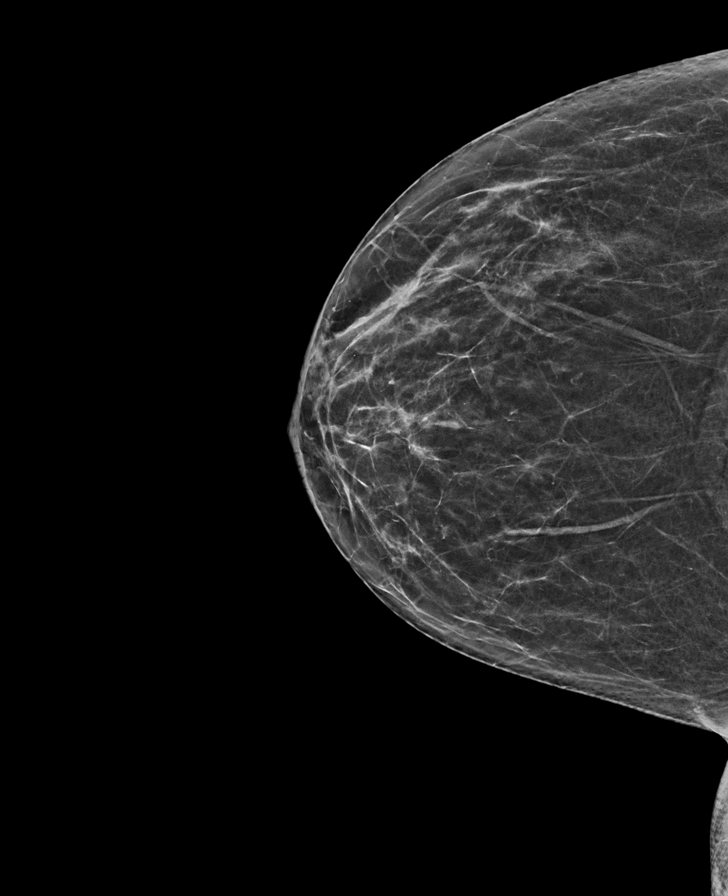

[L MLO synth-2D]
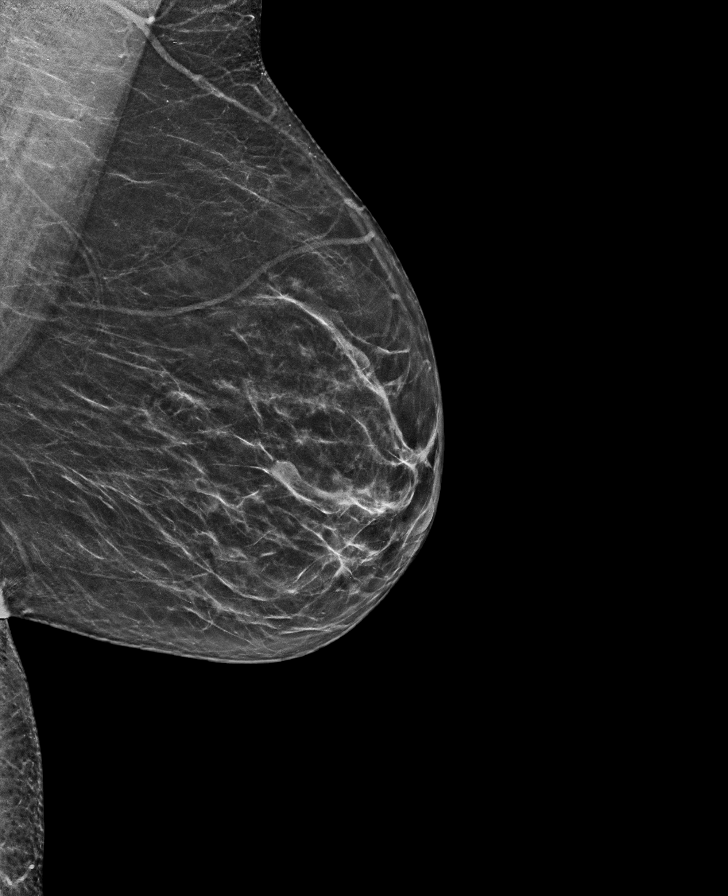

[R MLO tomo · tomo slice 32/63.0]
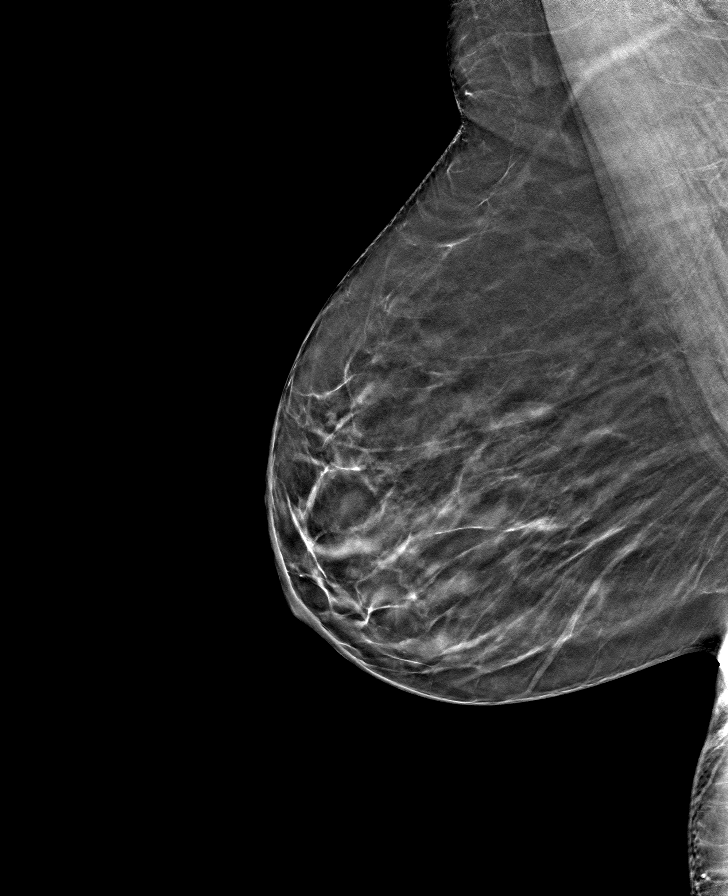

[L CC tomo · tomo slice 31/60.0]
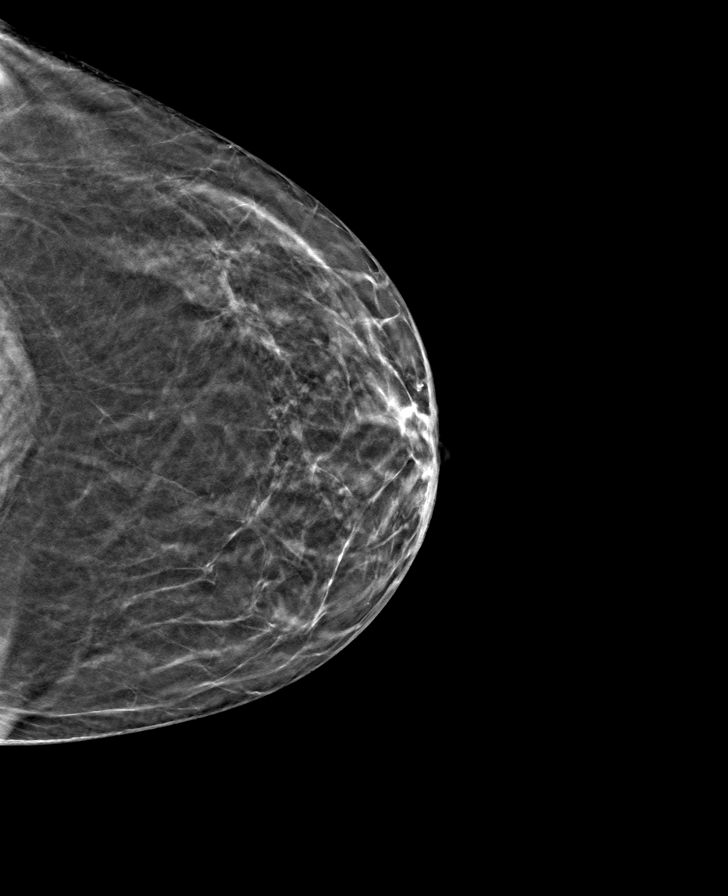

[L MLO tomo · tomo slice 31/62.0]
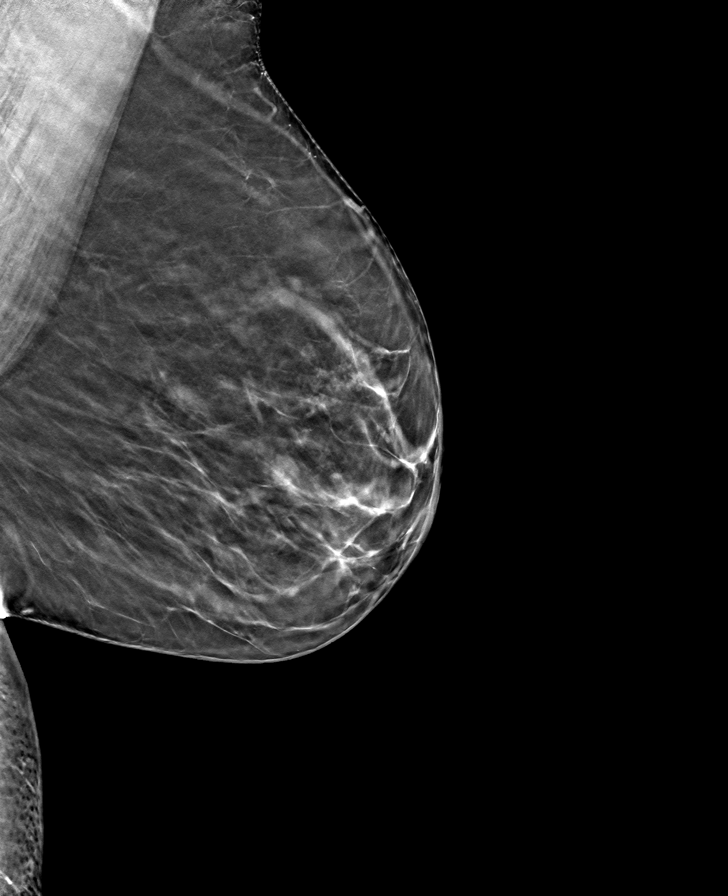

[R CC tomo · tomo slice 29/57.0]
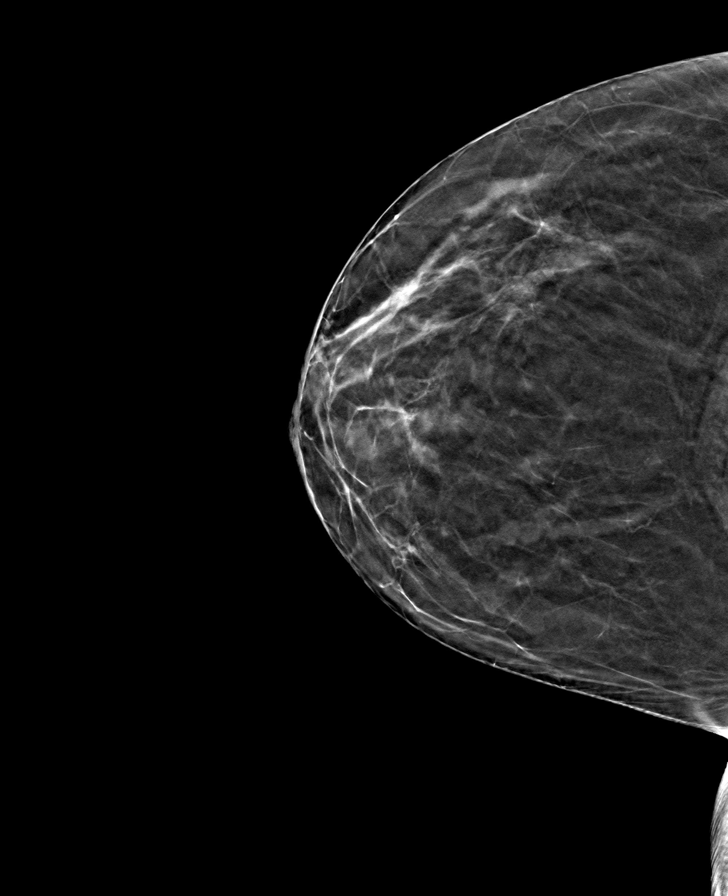

[8 of 24 positions shown; findings below may reference images not displayed]

ACR Breast Density Category b: There are scattered areas of
fibroglandular density.
FINDINGS: There are no findings suspicious for malignancy.
IMPRESSION: No mammographic evidence of malignancy. A result letter of this
screening mammogram will be mailed directly to the patient.

RECOMMENDATION:
Screening mammogram in one year. (Code:[BY])

BI-RADS CATEGORY  1: Negative.

## 2022-01-02 ENCOUNTER — Ambulatory Visit (INDEPENDENT_AMBULATORY_CARE_PROVIDER_SITE_OTHER): Payer: Medicare Other

## 2022-01-02 ENCOUNTER — Ambulatory Visit (INDEPENDENT_AMBULATORY_CARE_PROVIDER_SITE_OTHER): Payer: Medicare Other | Admitting: Sports Medicine

## 2022-01-02 DIAGNOSIS — M1711 Unilateral primary osteoarthritis, right knee: Secondary | ICD-10-CM

## 2022-01-02 DIAGNOSIS — M19072 Primary osteoarthritis, left ankle and foot: Secondary | ICD-10-CM | POA: Diagnosis not present

## 2022-01-02 NOTE — Assessment & Plan Note (Signed)
Osteoarthritis, severe, tibiotalar and subtalar noted on x-rays. Also some paresthesias plantar foot likely due to tarsal tunnel syndrome with a positive tarsal tunnel Tinel's sign. Continue ASO, meloxicam and tramadol have been effective and she is 85% better, no further intervention needed here, as above does need to work on about 100 pound weight loss.

## 2022-01-02 NOTE — Assessment & Plan Note (Signed)
 Pleasant 56 year old female, morbid obesity, right knee osteoarthritis, severe on x-rays, 5 degrees extension lag in motion, tenderness at the medial and lateral joint lines, not sufficiently better with meloxicam , tramadol , proceeding with injection, return to see me 6 weeks.

## 2022-01-02 NOTE — Progress Notes (Signed)
    Procedures performed today:    Procedure: Real-time Ultrasound Guided injection of the right knee Device: Samsung HS60  Verbal informed consent obtained.  Time-out conducted.  Noted no overlying erythema, induration, or other signs of local infection.  Skin prepped in a sterile fashion.  Local anesthesia: Topical Ethyl chloride.  With sterile technique and under real time ultrasound guidance: Mild effusion noted, 1 cc Kenalog 40, 2 cc lidocaine , 2 cc bupivacaine  injected easily Completed without difficulty  Advised to call if fevers/chills, erythema, induration, drainage, or persistent bleeding.  Images permanently stored and available for review in PACS.  Impression: Technically successful ultrasound guided injection.  Independent interpretation of notes and tests performed by another provider:   None.  Brief History, Exam, Impression, and Recommendations:    Primary osteoarthritis of right knee Pleasant 56 year old female, morbid obesity, right knee osteoarthritis, severe on x-rays, 5 degrees extension lag in motion, tenderness at the medial and lateral joint lines, not sufficiently better with meloxicam , tramadol , proceeding with injection, return to see me 6 weeks.  Primary osteoarthritis of left ankle Osteoarthritis, severe, tibiotalar and subtalar noted on x-rays. Also some paresthesias plantar foot likely due to tarsal tunnel syndrome with a positive tarsal tunnel Tinel's sign. Continue ASO, meloxicam  and tramadol  have been effective and she is 85% better, no further intervention needed here, as above does need to work on about 100 pound weight loss.  Chronic process with exacerbation and pharmacologic intervention  ___________________________________________ Debby PARAS. Curtis, M.D., ABFM., CAQSM. Primary Care and Sports Medicine [facility]  Adjunct Instructor of Family Medicine  [employer]

## 2022-01-17 NOTE — Progress Notes (Unsigned)
@Patient  ID: , female    DOB: 08/06/1965, 56 y.o.   MRN: [id number]  No chief complaint on file.   Referring provider: [id number], DO  HPI: 56 year old female, never smoked.  Past medical history significant for mild obstructive sleep apnea, asthma, hypothyroidism, edema, obesity.  01/18/2022 Patient presents today for sleep consult.   Sleep questionnaire Symptoms-  *** Prior sleep study- *** Bedtime- *** Time to fall asleep- ***  Nocturnal awakenings- *** Out of bed/start of day- *** Weight changes- *** Do you operate heavy machinery- *** Do you currently wear CPAP- *** Do you current wear oxygen- *** [place]- ***    Allergies  Allergen Reactions   Hydrocodone-Acetaminophen Hives and Itching   Latex Rash    Immunization History  Administered Date(s) Administered   Influenza,inj,Quad PF,6+ Mos 03/10/2021   Influenza,inj,Quad PF,6-35 Mos 07/31/2018   Influenza-Unspecified 07/09/2009, 04/23/2013, 05/04/2014, 04/27/2015   Janssen (J&J) SARS-COV-2 Vaccination 02/27/2020   Tdap 07/10/2007    Past Medical History:  Diagnosis Date   Abnormal mammogram    Abnormal Pap smear of cervix    Anxiety    Arthritis    Asthma    Depression    GERD (gastroesophageal reflux disease)    Hashimoto's disease    Heart murmur    Hyperlipidemia    borderline- no meds   Lymphedema    Lymphedema    bilateral legs   Neuromuscular disorder (HCC)    fibro many years ago told this, neurpathy legs   PONV (postoperative nausea and vomiting)    Sleep apnea    no CPAP   Thyroid disease    Hashimotos   Varicose veins     Tobacco History: Social History   Tobacco Use  Smoking Status Never  Smokeless Tobacco Never   Counseling given: Not Answered   Outpatient Medications Prior to Visit  Medication Sig Dispense Refill   albuterol (VENTOLIN HFA) 108 (90 Base) MCG/ACT inhaler USE 2 INHALATIONS BY MOUTH  EVERY 4 HOURS AS NEEDED FOR WHEEZING 51 g 3    AMBULATORY NON FORMULARY MEDICATION Knee-high, medium compression, graduated compression stockings. Apply to lower extremities. [email or web address], Zippered Compression Stockings, medium circ, long length 1 each 0   aspirin EC 81 MG tablet Take 81 mg by mouth daily. Swallow whole.     citalopram (CELEXA) 40 MG tablet TAKE 1 TABLET BY MOUTH DAILY 90 tablet 3   furosemide (LASIX) 20 MG tablet Take 1 tablet by mouth daily.  As needed     gabapentin (NEURONTIN) 100 MG capsule Take 1 capsule (100 mg total) by mouth daily. 90 capsule 90   gabapentin (NEURONTIN) 300 MG capsule TAKE 1 CAPSULE BY MOUTH IN  THE EVENING 90 capsule 3   meloxicam (MOBIC) 15 MG tablet One tab PO every 24 hours with a meal for 2 weeks, then once every 24 hours prn pain. 90 tablet 3   oxybutynin (DITROPAN XL) 10 MG 24 hr tablet Take 1 tablet (10 mg total) by mouth at bedtime. 90 tablet 1   pantoprazole (PROTONIX) 40 MG tablet TAKE 1 TABLET BY MOUTH  DAILY 90 tablet 3   phentermine 15 MG capsule Take 1 capsule (15 mg) daily x4 weeks then decrease to every other day x2 weeks then twice weekly x2 weeks.  Then stop. (Patient not taking: Reported on 11/20/2021) 60 capsule 0   potassium chloride SA (KLOR-CON) 20 MEQ tablet Take 1 tablet by mouth daily as needed. ONLY ON THE DAYS OF TAKING  LASIX     SYNTHROID 125 MCG tablet TAKE 1 TABLET BY MOUTH DAILY  BEFORE BREAKFAST 90 tablet 0   traMADol (ULTRAM) 50 MG tablet TAKE 1 TABLET BY MOUTH  EVERY 6 HOURS AS NEEDED 60 tablet 1   No facility-administered medications prior to visit.      Review of Systems  Review of Systems   Physical Exam  There were no vitals taken for this visit. Physical Exam   Lab Results:  CBC    Component Value Date/Time   WBC 5.6 11/20/2021 0902   RBC 4.52 11/20/2021 0902   HGB 14.0 11/20/2021 0902   HCT 42.4 11/20/2021 0902   PLT 256 11/20/2021 0902   MCV 93.8 11/20/2021 0902   MCH 31.0 11/20/2021 0902   MCHC 33.0 11/20/2021 0902   RDW 12.4  11/20/2021 0902   LYMPHSABS 1,294 11/20/2021 0902   EOSABS 174 11/20/2021 0902   BASOSABS 28 11/20/2021 0902    BMET    Component Value Date/Time   NA 141 11/20/2021 0902   K 4.4 11/20/2021 0902   CL 107 11/20/2021 0902   CO2 24 11/20/2021 0902   GLUCOSE 92 11/20/2021 0902   BUN 24 11/20/2021 0902   CREATININE 0.95 11/20/2021 0902   CALCIUM 9.5 11/20/2021 0902   GFRNONAA 86 10/07/2020 1037   GFRAA 99 10/07/2020 1037    BNP No results found for: "BNP"  ProBNP    Component Value Date/Time   PROBNP 27.0 05/26/2007 2139    Imaging: [place] LIMITED JOINT SPACE STRUCTURES LOW RIGHT  Result Date: 01/02/2022 Procedure: Real-time Ultrasound Guided injection of the right knee Device: Samsung HS60 Verbal informed consent obtained. Time-out conducted. Noted no overlying erythema, induration, or other signs of local infection. Skin prepped in a sterile fashion. Local anesthesia: Topical Ethyl chloride. With sterile technique and under real time ultrasound guidance: Mild effusion noted, 1 cc Kenalog 40, 2 cc lidocaine, 2 cc bupivacaine injected easily Completed without difficulty Advised to call if fevers/chills, erythema, induration, drainage, or persistent bleeding. Images permanently stored and available for review in PACS. Impression: Technically successful ultrasound guided injection.  MM 3D SCREEN BREAST BILATERAL  Result Date: 12/28/2021 CLINICAL DATA:  Screening. EXAM: DIGITAL SCREENING BILATERAL MAMMOGRAM WITH TOMOSYNTHESIS AND CAD TECHNIQUE: Bilateral screening digital craniocaudal and mediolateral oblique mammograms were obtained. Bilateral screening digital breast tomosynthesis was performed. The images were evaluated with computer-aided detection. COMPARISON:  Previous exam(s). ACR Breast Density Category b: There are scattered areas of fibroglandular density. FINDINGS: There are no findings suspicious for malignancy. IMPRESSION: No mammographic evidence of malignancy. A result letter  of this screening mammogram will be mailed directly to the patient. RECOMMENDATION: Screening mammogram in one year. (Code:SM-B-01Y) BI-RADS CATEGORY  1: Negative. Electronically Signed   By: Annia Belt M.D.   On: 12/28/2021 16:00     Assessment & Plan:   No problem-specific Assessment & Plan notes found for this encounter.     Glenford Bayley, NP 01/17/2022

## 2022-01-18 ENCOUNTER — Ambulatory Visit (INDEPENDENT_AMBULATORY_CARE_PROVIDER_SITE_OTHER): Payer: Medicare Other | Admitting: Primary Care

## 2022-01-18 ENCOUNTER — Encounter: Payer: Self-pay | Admitting: Primary Care

## 2022-01-18 VITALS — BP 110/62 | HR 63 | Temp 97.8°F | Ht 66.0 in | Wt 231.8 lb

## 2022-01-18 DIAGNOSIS — G4733 Obstructive sleep apnea (adult) (pediatric): Secondary | ICD-10-CM | POA: Diagnosis not present

## 2022-01-18 DIAGNOSIS — R0683 Snoring: Secondary | ICD-10-CM

## 2022-01-18 NOTE — Patient Instructions (Addendum)
Upon review of your sleep study from September 2020 you have mild obstructive sleep apnea.  With your recent weight loss suspect this has likely improved.  Recommend repeating a home sleep study to evaluate whether or not you still have sleep apnea.  It may take 4 to 6 weeks to schedule sleep study.  If you have not heard from our office in this timeframe please call to check on status of sleep study.  Sleep apnea is defined as period of 10 seconds or longer when you stop breathing at night. This can happen multiple times a night. Dx sleep apnea is when this occurs more than 5 times an hour.    Mild OSA 5-15 apneic events an hour Moderate OSA 15-30 apneic events an hour Severe OSA > 30 apneic events an hour   Untreated sleep apnea puts you at higher risk for cardiac arrhythmias, pulmonary HTN, stroke and diabetes   Treatment options include weight loss, side sleeping position, oral appliance, CPAP therapy or referral to ENT for possible surgical options    Recommendations Focus on side sleeping position Do not drive experiencing excessive daytime sleepiness or fatigue  Orders Home sleep study  Follow-up 6-week virtual visit with [facility] NP to review sleep study results and treatment options if needed   Sleep Apnea Sleep apnea is a condition in which breathing pauses or becomes shallow during sleep. People with sleep apnea usually snore loudly. They may have times when they gasp and stop breathing for 10 seconds or more during sleep. This may happen many times during the night. Sleep apnea disrupts your sleep and keeps your body from getting the rest that it needs. This condition can increase your risk of certain health problems, including: Heart attack. Stroke. Obesity. Type 2 diabetes. Heart failure. Irregular heartbeat. High blood pressure. The goal of treatment is to help you breathe normally again. What are the causes?  The most common cause of sleep apnea is a collapsed or  blocked airway. There are three kinds of sleep apnea: Obstructive sleep apnea. This kind is caused by a blocked or collapsed airway. Central sleep apnea. This kind happens when the part of the brain that controls breathing does not send the correct signals to the muscles that control breathing. Mixed sleep apnea. This is a combination of obstructive and central sleep apnea. What increases the risk? You are more likely to develop this condition if you: Are overweight. Smoke. Have a smaller than normal airway. Are older. Are female. Drink alcohol. Take sedatives or tranquilizers. Have a family history of sleep apnea. Have a tongue or tonsils that are larger than normal. What are the signs or symptoms? Symptoms of this condition include: Trouble staying asleep. Loud snoring. Morning headaches. Waking up gasping. Dry mouth or sore throat in the morning. Daytime sleepiness and tiredness. If you have daytime fatigue because of sleep apnea, you may be more likely to have: Trouble concentrating. Forgetfulness. Irritability or mood swings. Personality changes. Feelings of depression. Sexual dysfunction. This may include loss of interest if you are female, or erectile dysfunction if you are female. How is this diagnosed? This condition may be diagnosed with: A medical history. A physical exam. A series of tests that are done while you are sleeping (sleep study). These tests are usually done in a sleep lab, but they may also be done at home. How is this treated? Treatment for this condition aims to restore normal breathing and to ease symptoms during sleep. It may involve managing  health issues that can affect breathing, such as high blood pressure or obesity. Treatment may include: Sleeping on your side. Using a decongestant if you have nasal congestion. Avoiding the use of depressants, including alcohol, sedatives, and narcotics. Losing weight if you are overweight. Making changes to  your diet. Quitting smoking. Using a device to open your airway while you sleep, such as: An oral appliance. This is a custom-made mouthpiece that shifts your lower jaw forward. A continuous positive airway pressure (CPAP) device. This device blows air through a mask when you breathe out (exhale). A nasal expiratory positive airway pressure (EPAP) device. This device has valves that you put into each nostril. A bi-level positive airway pressure (BIPAP) device. This device blows air through a mask when you breathe in (inhale) and breathe out (exhale). Having surgery if other treatments do not work. During surgery, excess tissue is removed to create a wider airway. Follow these instructions at home: Lifestyle Make any lifestyle changes that your health care provider recommends. Eat a healthy, well-balanced diet. Take steps to lose weight if you are overweight. Avoid using depressants, including alcohol, sedatives, and narcotics. Do not use any products that contain nicotine or tobacco. These products include cigarettes, chewing tobacco, and vaping devices, such as e-cigarettes. If you need help quitting, ask your health care provider. General instructions Take over-the-counter and prescription medicines only as told by your health care provider. If you were given a device to open your airway while you sleep, use it only as told by your health care provider. If you are having surgery, make sure to tell your health care provider you have sleep apnea. You may need to bring your device with you. Keep all follow-up visits. This is important. Contact a health care provider if: The device that you received to open your airway during sleep is uncomfortable or does not seem to be working. Your symptoms do not improve. Your symptoms get worse. Get help right away if: You develop: Chest pain. Shortness of breath. Discomfort in your back, arms, or stomach. You have: Trouble speaking. Weakness on one  side of your body. Drooping in your face. These symptoms may represent a serious problem that is an emergency. Do not wait to see if the symptoms will go away. Get medical help right away. Call your local emergency services (911 in the [place] Do not drive yourself to the hospital. Summary Sleep apnea is a condition in which breathing pauses or becomes shallow during sleep. The most common cause is a collapsed or blocked airway. The goal of treatment is to restore normal breathing and to ease symptoms during sleep. This information is not intended to replace advice given to you by your health care provider. Make sure you discuss any questions you have with your health care provider. Document Revised: 02/01/2021 Document Reviewed: 06/03/2020 Elsevier Patient Education  2023 [employer].

## 2022-01-18 NOTE — Assessment & Plan Note (Signed)
-   Hx mild obstructive sleep apnea. PSG on 03/19/19 showed AHI 7.1/hr. CPAP titration was ordered, however, this was never completed. She continues to have symptoms of snoring.  She has lost 65 pounds since prior sleep study.  Epworth score 5. Needs home sleep study to reassess degree of OSA.  I suspect patient may no longer have sleep apnea. We reviewed risks of untreated sleep apnea including cardiac arrhythmias, stroke, pulmonary hypertension or diabetes.  Treatment options briefly.  If she does still have OSA and it remains mild, oral appliance may be a good treatment option for her.  Continue to encourage weight loss efforts.  Advised against driving experiencing excessive daytime sleepiness or fatigue.  Recommend patient focus on side sleeping position.  Follow-up in 4 to 6 weeks virtual visit to review sleep study results and treatment options if needed.

## 2022-01-18 NOTE — Progress Notes (Signed)
Reviewed and agree with assessment/plan.   Coralyn Helling, MD [facility] Pulmonary/Critical Care 01/18/2022, 12:13 PM Pager:  [phone]

## 2022-01-23 ENCOUNTER — Other Ambulatory Visit: Payer: Self-pay | Admitting: Medical-Surgical

## 2022-01-24 ENCOUNTER — Encounter: Payer: Self-pay | Admitting: Medical-Surgical

## 2022-01-24 ENCOUNTER — Ambulatory Visit (INDEPENDENT_AMBULATORY_CARE_PROVIDER_SITE_OTHER): Payer: Medicare Other | Admitting: Medical-Surgical

## 2022-01-24 VITALS — BP 114/78 | HR 59 | Resp 20 | Ht 66.0 in | Wt 232.4 lb

## 2022-01-24 DIAGNOSIS — M1711 Unilateral primary osteoarthritis, right knee: Secondary | ICD-10-CM

## 2022-01-24 DIAGNOSIS — Z7689 Persons encountering health services in other specified circumstances: Secondary | ICD-10-CM

## 2022-01-24 MED ORDER — WEGOVY 0.25 MG/0.5ML ~~LOC~~ SOAJ: 0.2500 mg | SUBCUTANEOUS | 0 refills | Status: DC

## 2022-01-24 MED ORDER — PHENTERMINE HCL 15 MG PO CAPS: 15.0000 mg | ORAL_CAPSULE | ORAL | 0 refills | Status: DC

## 2022-01-24 MED ORDER — LIDOCAINE 5 % EX PTCH: 1.0000 | MEDICATED_PATCH | CUTANEOUS | 0 refills | Status: DC

## 2022-01-24 NOTE — Progress Notes (Signed)
Established Patient Office Visit  Subjective   Patient ID: Barbara Calderon, female   DOB: [birth year] Age: 56 y.o. MRN: [id number]   Chief Complaint  Patient presents with   Weight Check   HPI Pleasant 56 year old female presenting today for weight check and is interested in possibly getting restarted on phentermine to help with weight loss.  Notes that she has seen Dr. Karie Schwalbe recently got an injection in her knee and felt great for a few weeks but this seems to have worn off.  She is still having issues with getting regular intentional exercise due to her knee pain.  She has tried lidocaine patches that a friend of hers had and really liked those.  She like to get a prescription for those today.  Has continued doing dietary modifications but is not weighing or logging her foods to keep adequate track of her intake.  Staying active is much as possible chasing around her 80-year-old and keeping things together around the house.  She was walking before her knee started hurting again but this has been limited since.  Not currently taking anything for weight management but is interested in restarting phentermine again.  She is also interested in possibly seeing if insurance will cover something like Wegovy.  This was denied before but may now be something we can get covered.   Objective:    Vitals:   01/24/22 0818  BP: 114/78  Pulse: (!) 59  Resp: 20  Height: 5\' 6"  (1.676 m)  Weight: 232 lb 6.4 oz (105.4 kg)  SpO2: 97%  BMI (Calculated): 37.53   Physical Exam Vitals and nursing note reviewed.  Constitutional:      General: She is not in acute distress.    Appearance: Normal appearance. She is obese. She is not ill-appearing.  HENT:     Head: Normocephalic and atraumatic.  Cardiovascular:     Rate and Rhythm: Normal rate and regular rhythm.     Pulses: Normal pulses.     Heart sounds: Normal heart sounds.  Pulmonary:     Effort: Pulmonary effort is normal. No respiratory distress.      Breath sounds: Normal breath sounds. No wheezing, rhonchi or rales.  Skin:    General: Skin is warm and dry.  Neurological:     Mental Status: She is alert and oriented to person, place, and time.  Psychiatric:        Mood and Affect: Mood normal.        Behavior: Behavior normal.        Thought Content: Thought content normal.        Judgment: Judgment normal.   No results found for this or any previous visit (from the past 24 hour(s)).     The 10-year ASCVD risk score (Arnett DK, et al., 2019) is: 1.9%   Values used to calculate the score:     Age: 10 years     Sex: Female     Is Non-Hispanic African American: No     Diabetic: No     Tobacco smoker: No     Systolic Blood Pressure: 114 mmHg     Is BP treated: No     HDL Cholesterol: 68 mg/dL     Total Cholesterol: 231 mg/dL   Assessment & Plan:   1. Encounter for weight management Discussed various options for weight management.  We will go ahead and restart phentermine at 15 mg daily but advised that this may not have the same  effect as it did last time.  She will need to be very strict with her p.o. intake.  Would strongly recommend starting to weigh foods to accurately assess portion sizes.  Continue activity as tolerated.  Since we are not sure if insurance will cover it, I am sending in [place] to see if it will go through with the new year changes.  2. Primary osteoarthritis of right knee Discussed various topical options that she can use for knee pain.  Sending in lidocaine patches since these seem to work well for her.  Advised to remove them after 12 hours of wear and allow the skin to breathe for a full 12 hours prior to replacing it with a new one.  Return in about 4 weeks (around 02/21/2022) for weight check.  ___________________________________________ Thayer Ohm, DNP, APRN, FNP-BC Primary Care and Sports Medicine [facility]

## 2022-01-30 ENCOUNTER — Telehealth: Payer: Self-pay

## 2022-01-30 NOTE — Telephone Encounter (Signed)
Received a phone call from [employer] for an order  regarding to Pneumatic compression device, advised we did Inot receive an y order went over the correct fax number for them to fax it over to our office  tel: [phone]

## 2022-02-13 ENCOUNTER — Ambulatory Visit (INDEPENDENT_AMBULATORY_CARE_PROVIDER_SITE_OTHER): Payer: Medicare Other | Admitting: Sports Medicine

## 2022-02-13 ENCOUNTER — Telehealth: Payer: Self-pay | Admitting: Sports Medicine

## 2022-02-13 ENCOUNTER — Ambulatory Visit (INDEPENDENT_AMBULATORY_CARE_PROVIDER_SITE_OTHER): Payer: Medicare Other

## 2022-02-13 DIAGNOSIS — M1711 Unilateral primary osteoarthritis, right knee: Secondary | ICD-10-CM

## 2022-02-13 DIAGNOSIS — M19072 Primary osteoarthritis, left ankle and foot: Secondary | ICD-10-CM | POA: Diagnosis not present

## 2022-02-13 NOTE — Assessment & Plan Note (Signed)
Barbara Calderon returns, she does have right knee osteoarthritis, severe, she has about 5 degrees of extension lag in motion, tenderness of the medial and lateral joint lines, at the last visit we injected her knee and she only got about 2 weeks of relief, she also has finished greater than 6 weeks of physician directed conservative treatment. Proceeding with approval for viscosupplementation, we also discussed genicular radiofrequency ablation and knee arthroplasty.

## 2022-02-13 NOTE — Assessment & Plan Note (Signed)
This is a pleasant 55 year old female, she has severe ankle osteoarthritis, both tibiotalar and subtalar and x-rays. She also has some plantar foot paresthesias likely due to tarsal tunnel syndrome with a positive tarsal tunnel Tinel's sign. Historically ASO, meloxicam, tramadol were effective and she had about 85% improvement which unfortunately has deteriorated, increasing pain, injected today. Return to see me in 6 weeks.

## 2022-02-13 NOTE — Telephone Encounter (Signed)
[employer] approval, right knee, failed steroid injections, x-ray confirmed osteoarthritis.

## 2022-02-13 NOTE — Progress Notes (Signed)
    Procedures performed today:    Procedure: Real-time Ultrasound Guided injection of the left ankle Device: Samsung HS60  Verbal informed consent obtained.  Time-out conducted.  Noted no overlying erythema, induration, or other signs of local infection.  Skin prepped in a sterile fashion.  Local anesthesia: Topical Ethyl chloride.  With sterile technique and under real time ultrasound guidance: Noted arthritis, 1 cc Kenalog 40, 1 cc lidocaine, 1 cc bupivacaine injected easily Completed without difficulty  Advised to call if fevers/chills, erythema, induration, drainage, or persistent bleeding.  Images permanently stored and available for review in PACS.  Impression: Technically successful ultrasound guided injection.  Independent interpretation of notes and tests performed by another provider:   None.  Brief History, Exam, Impression, and Recommendations:    Primary osteoarthritis of left ankle This is a pleasant 56 year old female, she has severe ankle osteoarthritis, both tibiotalar and subtalar and x-rays. She also has some plantar foot paresthesias likely due to tarsal tunnel syndrome with a positive tarsal tunnel Tinel's sign. Historically ASO, meloxicam, tramadol were effective and she had about 85% improvement which unfortunately has deteriorated, increasing pain, injected today. Return to see me in 6 weeks.  Primary osteoarthritis of right knee Samiyyah returns, she does have right knee osteoarthritis, severe, she has about 5 degrees of extension lag in motion, tenderness of the medial and lateral joint lines, at the last visit we injected her knee and she only got about 2 weeks of relief, she also has finished greater than 6 weeks of physician directed conservative treatment. Proceeding with approval for viscosupplementation, we also discussed genicular radiofrequency ablation and knee arthroplasty.    ____________________________________________ Ihor Austin. Benjamin Stain,  M.D., ABFM., CAQSM., AME. Primary Care and Sports Medicine [facility]  Adjunct Professor of Family Medicine  [employer]  Restaurant manager, fast food

## 2022-02-14 ENCOUNTER — Encounter: Payer: Self-pay | Admitting: Medical-Surgical

## 2022-02-14 ENCOUNTER — Other Ambulatory Visit: Payer: Self-pay | Admitting: Medical-Surgical

## 2022-02-14 MED ORDER — PHENTERMINE HCL 15 MG PO CAPS: 15.0000 mg | ORAL_CAPSULE | ORAL | 0 refills | Status: DC

## 2022-02-23 NOTE — Telephone Encounter (Signed)
Benefits Investigation Details received from [employer] Injection: Synvisc  Medical: Deductible does not apply. Once the OOP has been met, pt is covered at 100%. PA is required for the drug through [employer].  PA required: Yes PA completed online; Ref# [id number]. No auth needed for preferred drug.   Pharmacy: Product not covered under the pharmacy plan.   Specialty Pharmacy: [facility]  May fill through: [facility] [place] Copay/Coinsurance: $90 Product Copay: 20% ($140) Administration Coinsurance:  Administration Copay: $20  Deductible:  Out of Pocket Max: $3600 (met: $205.89)    Pt's approximate cost per injection is $180.   Left msg for a return call from patient regarding gel injections and out-of-pocket costs.

## 2022-02-27 NOTE — Telephone Encounter (Signed)
Patient aware of her approx OOP cost per injection. She will consider her options and return a call to the office if she wishes to proceed. She is aware the med will need to be ordered.

## 2022-03-01 ENCOUNTER — Other Ambulatory Visit: Payer: Self-pay | Admitting: Medical-Surgical

## 2022-03-01 ENCOUNTER — Ambulatory Visit: Payer: Medicare Other | Admitting: Primary Care

## 2022-03-01 ENCOUNTER — Telehealth: Payer: Self-pay | Admitting: *Deleted

## 2022-03-01 NOTE — Telephone Encounter (Signed)
Left message on voicemail to call and schedule F/u visit in October 2023. Awaiting her HST to be done. Provided office number to call.

## 2022-03-03 ENCOUNTER — Other Ambulatory Visit: Payer: Self-pay | Admitting: Medical-Surgical

## 2022-03-05 ENCOUNTER — Ambulatory Visit: Payer: Medicare Other | Admitting: Adult Health

## 2022-03-14 ENCOUNTER — Ambulatory Visit: Payer: Medicare Other

## 2022-03-14 ENCOUNTER — Encounter: Payer: Self-pay | Admitting: Sports Medicine

## 2022-03-14 DIAGNOSIS — M1711 Unilateral primary osteoarthritis, right knee: Secondary | ICD-10-CM

## 2022-03-14 DIAGNOSIS — G4733 Obstructive sleep apnea (adult) (pediatric): Secondary | ICD-10-CM

## 2022-03-14 DIAGNOSIS — R0683 Snoring: Secondary | ICD-10-CM

## 2022-03-15 ENCOUNTER — Other Ambulatory Visit: Payer: Self-pay

## 2022-03-15 ENCOUNTER — Telehealth: Payer: Self-pay

## 2022-03-15 NOTE — Telephone Encounter (Addendum)
Initiated Prior authorization [id number] 5% patches Via: Covermymeds Case/Key:[id number]  Status: approved  as of 03/15/22 Reason:approved through 07/08/2022 Notified Pt via: Mychart

## 2022-03-27 ENCOUNTER — Ambulatory Visit: Payer: Medicare Other | Admitting: Sports Medicine

## 2022-03-27 DIAGNOSIS — M1711 Unilateral primary osteoarthritis, right knee: Secondary | ICD-10-CM | POA: Diagnosis not present

## 2022-03-27 DIAGNOSIS — M25561 Pain in right knee: Secondary | ICD-10-CM | POA: Diagnosis not present

## 2022-03-28 ENCOUNTER — Ambulatory Visit: Payer: Medicare Other | Admitting: Primary Care

## 2022-03-29 ENCOUNTER — Ambulatory Visit: Payer: Medicare Other

## 2022-03-29 DIAGNOSIS — G4733 Obstructive sleep apnea (adult) (pediatric): Secondary | ICD-10-CM | POA: Diagnosis not present

## 2022-04-02 DIAGNOSIS — G4733 Obstructive sleep apnea (adult) (pediatric): Secondary | ICD-10-CM | POA: Diagnosis not present

## 2022-04-03 ENCOUNTER — Telehealth: Payer: Self-pay | Admitting: Medical-Surgical

## 2022-04-03 NOTE — Telephone Encounter (Signed)
Patient has been scheduled for surgical clearance appointment. Barbara Calderon

## 2022-04-03 NOTE — Telephone Encounter (Signed)
Received preoperative risk assessment via fax for upcoming right total hip arthroplasty.  Patient will need an appointment to complete preoperative clearance.  Please contact her to get this scheduled at her earliest convenience to prevent delaying her procedure.  ___________________________________________ Clearnce Sorrel, DNP, APRN, FNP-BC Primary Care and [place]

## 2022-04-10 ENCOUNTER — Other Ambulatory Visit: Payer: Self-pay | Admitting: Medical-Surgical

## 2022-04-10 NOTE — Progress Notes (Unsigned)
@Patient  ID: Barbara Calderon, female    DOB: 1965/08/26, 56 y.o.   MRN: [id number]  No chief complaint on file.   Referring provider: Samuel Bouche, NP  HPI:  56 year old female, never smoked.  Past medical history significant for mild obstructive sleep apnea, asthma, hypothyroidism, edema, obesity.  01/18/2022 Patient presents today for sleep consult.  She has a history of mild obstructive sleep apnea.  She had a sleep study on March 19, 2019 with [facility] that showed mild OSA, AHI 7.1/hr. At that time it was recommended that she had a CPAP titration study, however, she was unable to complete this due to fostering an infant.  Since her sleep study in 2020 she has lost 65 pounds.  She is sleeping better at night.  She still has some snoring.  Typical bedtime is between 10 and 11 PM.  It takes her on average 10 minutes to fall asleep.  She wakes up 1-2 times a night to use the restroom.  She starts her day at 7 AM. Concerned about being on cpap at night.   Sleep questionnaire Symptoms-  Snoring Prior sleep study- September 10th 2020 PSG >> AHI 7.1/hr (reviewed through patient's MyChart) Bedtime- 10-11pm Time to fall asleep- <59min  Nocturnal awakenings- 1-2 times Out of bed/start of day- 7am Weight changes- down 65 lbs Do you operate heavy machinery- No Do you currently wear CPAP- No Do you current wear oxygen- No Epworth- 5  04/11/2022- Interim hx  Patient presents today to review sleep study results.    HST 03/29/22 showed mild OSA, AHI 9.0/hr. SpO2 low 85% (average 91%). Weight 230lbs.           Allergies  Allergen Reactions   Hydrocodone-Acetaminophen Hives and Itching   Latex Rash    Immunization History  Administered Date(s) Administered   Influenza,inj,Quad PF,6+ Mos 03/10/2021   Influenza,inj,Quad PF,6-35 Mos 07/31/2018   Influenza-Unspecified 07/09/2009, 04/23/2013, 05/04/2014, 04/27/2015   Janssen (J&J) SARS-COV-2 Vaccination 02/27/2020    Tdap 07/10/2007    Past Medical History:  Diagnosis Date   Abnormal mammogram    Abnormal Pap smear of cervix    Anxiety    Arthritis    Asthma    Depression    GERD (gastroesophageal reflux disease)    Hashimoto's disease    Heart murmur    Hyperlipidemia    borderline- no meds   Lymphedema    Lymphedema    bilateral legs   Neuromuscular disorder (HCC)    fibro many years ago told this, neurpathy legs   PONV (postoperative nausea and vomiting)    Sleep apnea    no CPAP   Thyroid disease    Hashimotos   Varicose veins     Tobacco History: Social History   Tobacco Use  Smoking Status Never  Smokeless Tobacco Never   Counseling given: Not Answered   Outpatient Medications Prior to Visit  Medication Sig Dispense Refill   albuterol (VENTOLIN HFA) 108 (90 Base) MCG/ACT inhaler USE 2 INHALATIONS BY MOUTH  EVERY 4 HOURS AS NEEDED FOR WHEEZING 51 g 3   AMBULATORY NON FORMULARY MEDICATION Knee-high, medium compression, graduated compression stockings. Apply to lower extremities. [email or web address], Zippered Compression Stockings, medium circ, long length 1 each 0   aspirin EC 81 MG tablet Take 81 mg by mouth daily. Swallow whole.     citalopram (CELEXA) 40 MG tablet TAKE 1 TABLET BY MOUTH DAILY 90 tablet 3   furosemide (LASIX) 20 MG tablet Take 1 tablet  by mouth daily.  As needed     gabapentin (NEURONTIN) 100 MG capsule Take 1 capsule (100 mg total) by mouth daily. 90 capsule 90   gabapentin (NEURONTIN) 300 MG capsule TAKE 1 CAPSULE BY MOUTH IN  THE EVENING 90 capsule 3   lidocaine (LIDODERM) 5 % Place 1 patch onto the skin daily. Remove & Discard patch within 12 hours or as directed by MD 30 patch 0   meloxicam (MOBIC) 15 MG tablet One tab PO every 24 hours with a meal for 2 weeks, then once every 24 hours prn pain. 90 tablet 3   oxybutynin (DITROPAN XL) 10 MG 24 hr tablet Take 1 tablet (10 mg total) by mouth at bedtime. 90 tablet 1   pantoprazole (PROTONIX) 40 MG  tablet TAKE 1 TABLET BY MOUTH  DAILY 90 tablet 3   phentermine 15 MG capsule Take 1 capsule (15 mg total) by mouth every morning. 30 capsule 0   potassium chloride SA (KLOR-CON) 20 MEQ tablet Take 1 tablet by mouth daily as needed. ONLY ON THE DAYS OF TAKING LASIX     SYNTHROID 125 MCG tablet TAKE 1 TABLET BY MOUTH DAILY  BEFORE BREAKFAST 90 tablet 3   traMADol (ULTRAM) 50 MG tablet TAKE 1 TABLET BY MOUTH EVERY 6  HOURS AS NEEDED 60 tablet 0   WEGOVY 0.25 MG/0.5ML SOAJ Inject 0.25 mg into the skin once a week. Use this dose for 1 month (4 shots) and then increase to next higher dose. 2 mL 0   No facility-administered medications prior to visit.      Review of Systems  Review of Systems   Physical Exam  There were no vitals taken for this visit. Physical Exam   Lab Results:  CBC    Component Value Date/Time   WBC 5.6 11/20/2021 0902   RBC 4.52 11/20/2021 0902   HGB 14.0 11/20/2021 0902   HCT 42.4 11/20/2021 0902   PLT 256 11/20/2021 0902   MCV 93.8 11/20/2021 0902   MCH 31.0 11/20/2021 0902   MCHC 33.0 11/20/2021 0902   RDW 12.4 11/20/2021 0902   LYMPHSABS 1,294 11/20/2021 0902   EOSABS 174 11/20/2021 0902   BASOSABS 28 11/20/2021 0902    BMET    Component Value Date/Time   NA 141 11/20/2021 0902   K 4.4 11/20/2021 0902   CL 107 11/20/2021 0902   CO2 24 11/20/2021 0902   GLUCOSE 92 11/20/2021 0902   BUN 24 11/20/2021 0902   CREATININE 0.95 11/20/2021 0902   CALCIUM 9.5 11/20/2021 0902   GFRNONAA 86 10/07/2020 1037   GFRAA 99 10/07/2020 1037    BNP No results found for: "BNP"  ProBNP    Component Value Date/Time   PROBNP 27.0 05/26/2007 2139    Imaging: No results found.   Assessment & Plan:   No problem-specific Assessment & Plan notes found for this encounter.     Glenford Bayley, NP 04/10/2022

## 2022-04-11 ENCOUNTER — Encounter: Payer: Self-pay | Admitting: Primary Care

## 2022-04-11 ENCOUNTER — Ambulatory Visit: Payer: Medicare Other | Admitting: Primary Care

## 2022-04-11 ENCOUNTER — Telehealth: Payer: Self-pay | Admitting: Primary Care

## 2022-04-11 ENCOUNTER — Ambulatory Visit (INDEPENDENT_AMBULATORY_CARE_PROVIDER_SITE_OTHER): Payer: Medicare Other | Admitting: Medical-Surgical

## 2022-04-11 ENCOUNTER — Encounter: Payer: Self-pay | Admitting: Medical-Surgical

## 2022-04-11 VITALS — BP 96/59 | HR 60 | Resp 20 | Ht 66.0 in | Wt 230.6 lb

## 2022-04-11 DIAGNOSIS — E063 Autoimmune thyroiditis: Secondary | ICD-10-CM | POA: Diagnosis not present

## 2022-04-11 DIAGNOSIS — E038 Other specified hypothyroidism: Secondary | ICD-10-CM

## 2022-04-11 DIAGNOSIS — Z23 Encounter for immunization: Secondary | ICD-10-CM

## 2022-04-11 DIAGNOSIS — Z01818 Encounter for other preprocedural examination: Secondary | ICD-10-CM | POA: Diagnosis not present

## 2022-04-11 DIAGNOSIS — G4730 Sleep apnea, unspecified: Secondary | ICD-10-CM

## 2022-04-11 MED ORDER — PHENTERMINE HCL 15 MG PO CAPS: 15.0000 mg | ORAL_CAPSULE | ORAL | 0 refills | Status: DC

## 2022-04-11 NOTE — Telephone Encounter (Signed)
Have we received anything for pre-op clearance for Barbara Calderon, she is planning for upcoming knee surgery in October with Dr. Berenice Primas with [facility]

## 2022-04-11 NOTE — Progress Notes (Signed)
Established Patient Office Visit  Subjective   Patient ID: Barbara Calderon, female   DOB: May 08, 1966 Age: 56 y.o. MRN: [id number]   Chief Complaint  Patient presents with   Form Completion    SURGICAL CLEARANCE    HPI Very pleasant 56 year old female presenting today for preoperative clearance for an upcoming total right knee replacement.  She is going to have her surgery with [facility] but this has not been scheduled yet as she is waiting on clearance.  She was able to see pulmonology this morning where they have given her the go in relation to asthma and sleep apnea.  Today, she reports that her knee pain has significantly limited her ability to function normally and be as mobile as she needs with a 80-year-old to take care of.  She is able to do general activities around home without chest pain or shortness of breath but our ability to test for cardiac capacity is limited due to her severely limited mobility and pain.  She does not have any history of cardiovascular disease and is not on any anticoagulants.  No urinary complaints.  Reviewed her medication list for potential medications that may interfere with surgery and/or recovery.  Of note, she has been continuing her efforts at weight loss despite her limited mobility.  Has been using phentermine 15 mg daily, tolerating well without side effects.  Notes that this does help keep her appetite somewhat suppressed and she has been very supportive decreasing.  She is at her lowest weight in several months and wants to continue phentermine until her surgery date is set.  Is aware that she should not take this medication within the 2 weeks preceding surgery.  Also notes that her nails have gotten more brittle and are breaking more lately. Wonders if this is related to her thyroid. Would like to have this checked today.    Objective:    Vitals:   04/11/22 1344  BP: (!) 96/59  Pulse: 60  Resp: 20  Height: 5\' 6"  (1.676 m)  Weight:  230 lb 9.6 oz (104.6 kg)  SpO2: 98%  BMI (Calculated): 37.24    Physical Exam Vitals reviewed.  Constitutional:      General: She is not in acute distress.    Appearance: Normal appearance. She is obese. She is not ill-appearing.  HENT:     Head: Normocephalic and atraumatic.  Cardiovascular:     Rate and Rhythm: Normal rate and regular rhythm.     Pulses: Normal pulses.     Heart sounds: Normal heart sounds.  Pulmonary:     Effort: Pulmonary effort is normal. No respiratory distress.     Breath sounds: Normal breath sounds. No wheezing, rhonchi or rales.  Skin:    General: Skin is warm and dry.  Neurological:     Mental Status: She is alert and oriented to person, place, and time.  Psychiatric:        Mood and Affect: Mood normal.        Behavior: Behavior normal.        Thought Content: Thought content normal.        Judgment: Judgment normal.   No results found for this or any previous visit (from the past 24 hour(s)).     The 10-year ASCVD risk score (Arnett DK, et al., 2019) is: 1.3%   Values used to calculate the score:     Age: 65 years     Sex: Female  Is Non-Hispanic African American: No     Diabetic: No     Tobacco smoker: No     Systolic Blood Pressure: 96 mmHg     Is BP treated: No     HDL Cholesterol: 68 mg/dL     Total Cholesterol: 231 mg/dL   Assessment & Plan:   1. Preoperative clearance Reviewed current health concerns, medication list, and problem list.  From a primary care standpoint, she would be a low risk for a low to moderate risk surgery.  Unable to demonstrate metabolic equivalents of cardiac capacity due to mobility limitations however she does not have a history of cardiac concerns and does not experience chest pain/shortness of breath with strenuous activities.  2. Hypothyroidism due to Hashimoto's thyroiditis Checking TSH today per patient request. - TSH  3. Need for influenza vaccination Flu vaccine given in office today. - Flu  Vaccine QUAD 6+ mos PF IM (Fluarix Quad PF)  4. Obesity, morbid, BMI 40.0-49.9 (HCC) Discussed the use of phentermine.  I am okay to continue another month of 15 mg daily however the use of this medication is indicated for short-term only and we will ultimately have to have a different plan if we are going to help her lose weight and keep it off.  Refilling phentermine for now with instructions to stop this medication at least 14 days prior to her scheduled surgery.  Patient verbalized understanding and is agreeable to the plan. - phentermine 15 MG capsule; Take 1 capsule (15 mg total) by mouth every morning.  Dispense: 30 capsule; Refill: 0  Return if symptoms worsen or fail to improve.  ___________________________________________ Clearnce Sorrel, DNP, APRN, FNP-BC [facility] [place]

## 2022-04-11 NOTE — Assessment & Plan Note (Signed)
-   Home sleep study on 03/29/22 showed mild OSA, AHI 9.0/hr. SpO2 low 85% (average 91%). We reviewed treatment options including weight loss, oral appliance, CPAP therapy or referral to ENT for possible surgical options. She would like to continue to work on weight loss efforts and be referred to orthodontic dentist to be considered for oral appliance. Advised she get a wedge pillow to sleep with. Follow-up in 6 months with [facility] NP for check up   She will be considered low risk for prolonged mechanical ventilation and/or post-op pulmonary complications d/t mild OSA. From pulmonary standpoint she can have surgery done at an outpatient surgical setting with anesthesiology present. Ultimate clearance will be decided by surgeon.

## 2022-04-11 NOTE — Patient Instructions (Signed)
Home sleep study on 03/29/22 showed mild OSA, AHI 9.0/hr. SpO2 low 85% (average 91%)  Recommendations: Weight loss as able, goal 200lbs Get wedge pillow to try sleeping with  Use oral appliance if able   From pulmonary standpoint you are ok to have surgery in outpatient surgical center  Referral: Orthodontics re: mild sleep apnea (Dr. Augustina Mood)  Follow-up: 6 months with [facility] NP for check up   Sleep Apnea Sleep apnea affects breathing during sleep. It causes breathing to stop for 10 seconds or more, or to become shallow. People with sleep apnea usually snore loudly. It can also increase the risk of: Heart attack. Stroke. Being very overweight (obese). Diabetes. Heart failure. Irregular heartbeat. High blood pressure. The goal of treatment is to help you breathe normally again. What are the causes?  The most common cause of this condition is a collapsed or blocked airway. There are three kinds of sleep apnea: Obstructive sleep apnea. This is caused by a blocked or collapsed airway. Central sleep apnea. This happens when the brain does not send the right signals to the muscles that control breathing. Mixed sleep apnea. This is a combination of obstructive and central sleep apnea. What increases the risk? Being overweight. Smoking. Having a small airway. Being older. Being female. Drinking alcohol. Taking medicines to calm yourself (sedatives or tranquilizers). Having family members with the condition. Having a tongue or tonsils that are larger than normal. What are the signs or symptoms? Trouble staying asleep. Loud snoring. Headaches in the morning. Waking up gasping. Dry mouth or sore throat in the morning. Being sleepy or tired during the day. If you are sleepy or tired during the day, you may also: Not be able to focus your mind (concentrate). Forget things. Get angry a lot and have mood swings. Feel sad (depressed). Have changes in your personality. Have  less interest in sex, if you are female. Be unable to have an erection, if you are female. How is this treated?  Sleeping on your side. Using a medicine to get rid of mucus in your nose (decongestant). Avoiding the use of alcohol, medicines to help you relax, or certain pain medicines (narcotics). Losing weight, if needed. Changing your diet. Quitting smoking. Using a machine to open your airway while you sleep, such as: An oral appliance. This is a mouthpiece that shifts your lower jaw forward. A CPAP device. This device blows air through a mask when you breathe out (exhale). An EPAP device. This has valves that you put in each nostril. A BIPAP device. This device blows air through a mask when you breathe in (inhale) and breathe out. Having surgery if other treatments do not work. Follow these instructions at home: Lifestyle Make changes that your doctor recommends. Eat a healthy diet. Lose weight if needed. Avoid alcohol, medicines to help you relax, and some pain medicines. Do not smoke or use any products that contain nicotine or tobacco. If you need help quitting, ask your doctor. General instructions Take over-the-counter and prescription medicines only as told by your doctor. If you were given a machine to use while you sleep, use it only as told by your doctor. If you are having surgery, make sure to tell your doctor you have sleep apnea. You may need to bring your device with you. Keep all follow-up visits. Contact a doctor if: The machine that you were given to use during sleep bothers you or does not seem to be working. You do not get better. You  get worse. Get help right away if: Your chest hurts. You have trouble breathing in enough air. You have an uncomfortable feeling in your back, arms, or stomach. You have trouble talking. One side of your body feels weak. A part of your face is hanging down. These symptoms may be an emergency. Get help right away. Call your  local emergency services (911 in the [place] Do not wait to see if the symptoms will go away. Do not drive yourself to the hospital. Summary This condition affects breathing during sleep. The most common cause is a collapsed or blocked airway. The goal of treatment is to help you breathe normally while you sleep. This information is not intended to replace advice given to you by your health care provider. Make sure you discuss any questions you have with your health care provider. Document Revised: 02/01/2021 Document Reviewed: 06/03/2020 Elsevier Patient Education  2023 [employer].

## 2022-04-11 NOTE — Progress Notes (Signed)
Reviewed and agree with assessment/plan.   Dayton Kenley, MD [facility] 04/11/2022, 9:53 AM Pager:  [phone]  

## 2022-04-11 NOTE — Addendum Note (Signed)
Addended by: Irine Seal B on: 04/11/2022 09:32 AM   Modules accepted: Orders

## 2022-04-12 LAB — TSH: TSH: 0.59 mIU/L (ref 0.40–4.50)

## 2022-04-27 ENCOUNTER — Telehealth: Payer: Self-pay | Admitting: Neurology

## 2022-04-27 ENCOUNTER — Other Ambulatory Visit: Payer: Self-pay | Admitting: Medical-Surgical

## 2022-04-27 DIAGNOSIS — I890 Lymphedema, not elsewhere classified: Secondary | ICD-10-CM | POA: Diagnosis not present

## 2022-04-27 MED ORDER — TRAMADOL HCL 50 MG PO TABS: 50.0000 mg | ORAL_TABLET | Freq: Four times a day (QID) | ORAL | 0 refills | Status: DC | PRN

## 2022-04-27 NOTE — Telephone Encounter (Signed)
Routing to covering provider.  Last OV: 04/11/22 Next OV: no appt scheduled Last RF: 03/09/22

## 2022-04-27 NOTE — Telephone Encounter (Signed)
PDMP reviewed during this encounter.  

## 2022-04-27 NOTE — Telephone Encounter (Signed)
Barbara Calderon called in to see if our office received a surgical clearance so they can schedule her surgery?

## 2022-04-27 NOTE — Telephone Encounter (Signed)
Received surgery clearance form via fax. Putting in Dr. Georgie Chard box

## 2022-05-07 ENCOUNTER — Encounter: Payer: Self-pay | Admitting: Orthopaedic Surgery

## 2022-05-07 ENCOUNTER — Ambulatory Visit: Payer: Medicare Other | Admitting: Orthopaedic Surgery

## 2022-05-07 VITALS — Ht 64.0 in | Wt 228.0 lb

## 2022-05-07 DIAGNOSIS — M25561 Pain in right knee: Secondary | ICD-10-CM

## 2022-05-07 DIAGNOSIS — G8929 Other chronic pain: Secondary | ICD-10-CM

## 2022-05-07 DIAGNOSIS — M1711 Unilateral primary osteoarthritis, right knee: Secondary | ICD-10-CM

## 2022-05-07 NOTE — Progress Notes (Signed)
The patient is a 56 year old female that I am seeing for the first time.  She is sent from [facility]che, NP to evaluate and treat known severe arthritis of her right knee.  She has been dealing with right knee pain for many years now and has been significantly getting worse for her.  She does ambulate with a rolling walker.  She is not diabetic and her BMI is 39.  She has had aspirations and steroid injections in her right knee.  Her x-rays are reviewed and we will review with her again today but they do show severe bone-on-bone wear with varus malalignment which is significant.  At this point her right knee pain is 10 out of 10 and is daily.  It is detrimentally affecting her mobility, her quality of life and actives daily living.  She has tried activity modification.  She uses a lidocaine patch.  Again she has had steroid injections which helped for about 2 weeks.  She has worked on weight loss and activity modification as well as quad strengthening exercises.  I was able to review all of her notes within epic and her medical history and medications.  She currently denies any headache, chest pain, shortness of breath, fever, chills, nausea, vomiting.  She does report bilateral lymphedema in her legs and does wear compressive garments.  Examination of her right knee shows significant varus malalignment that is correctable.  She does not have a very large soft tissue envelope around her right knee.  There is significant patellofemoral crepitation and severe medial joint line tenderness.  She has painful arc of motion of the right knee.  X-rays of the right knee show severe bone-on-bone wear with end-stage arthritis of the right knee.  There is complete loss of medial joint space with significant varus malalignment.  There are osteophytes in all 3 compartments.  The patient does suffer from for severe end-stage arthritis of the right knee.  I had a long and thorough discussion with her about knee replacement  surgery.  We discussed the risks and benefits of the surgery.  I showed her knee replacement model and went over her x-rays.  We talked about what to expect from an intraoperative and postoperative course.  All questions and concerns were answered and addressed.  I have talked her about keeping her weight down which will be helpful for [place] from our surgical standpoint.  We will work on getting surgery scheduled for right total knee arthroplasty.

## 2022-05-14 NOTE — Telephone Encounter (Signed)
Jennette Kettle called for update on form, still not received.

## 2022-05-14 NOTE — Telephone Encounter (Signed)
[facility] Clearance faxed over last week and again today.

## 2022-05-18 ENCOUNTER — Other Ambulatory Visit: Payer: Self-pay

## 2022-05-21 NOTE — Telephone Encounter (Signed)
Barbara Calderon from [facility] called in and left a message with the access nurse. They need the surgical clearance.

## 2022-05-21 NOTE — Telephone Encounter (Signed)
Called and got new number for FAX [phone]. Faxed it in front office

## 2022-06-13 ENCOUNTER — Other Ambulatory Visit: Payer: Self-pay | Admitting: Physician Assistant

## 2022-06-13 DIAGNOSIS — Z01818 Encounter for other preprocedural examination: Secondary | ICD-10-CM

## 2022-06-15 NOTE — Patient Instructions (Signed)
SURGICAL WAITING ROOM VISITATION Patients having surgery or a procedure may have no more than 2 support people in the waiting area - these visitors may rotate.   Children under the age of 3 must have an adult with them who is not the patient. If the patient needs to stay at the hospital during part of their recovery, the visitor guidelines for inpatient rooms apply. Pre-op nurse will coordinate an appropriate time for 1 support person to accompany patient in pre-op.  This support person may not rotate.    Please refer to the [facility] website for the visitor guidelines for Inpatients (after your surgery is over and you are in a regular room).       Your procedure is scheduled on: 06-22-22   Report to [facility] Main Entrance    Report to admitting at     [place]  AM   Call this number if you have problems the morning of surgery [phone]   Do not eat food :After Midnight.   After Midnight you may have the following liquids until _0845_____ AM/ DAY OF SURGERY  THEN NOTHING BY MOUTH  Water Non-Citrus Juices (without pulp, NO RED) Carbonated Beverages Black Coffee (NO MILK/CREAM OR CREAMERS, sugar ok)  Clear Tea (NO MILK/CREAM OR CREAMERS, sugar ok) regular and decaf                             Plain Jell-O (NO RED)                                           Fruit ices (not with fruit pulp, NO RED)                                     Popsicles (NO RED)                                                               Sports drinks like Gatorade (NO RED)                   The day of surgery:  Drink ONE (1) Pre-Surgery Clear Ensure at    0830  AM the morning of surgery. Drink in one sitting. Do not sip.  This drink was given to you during your hospital  pre-op appointment visit. Nothing else to drink after completing the  Pre-Surgery Clear Ensure or G2.          If you have questions, please contact your surgeon's office.   FOLLOW ANY ADDITIONAL PRE OP INSTRUCTIONS YOU  RECEIVED FROM YOUR SURGEON'S OFFICE!!!     Oral Hygiene is also important to reduce your risk of infection.                                    Remember - BRUSH YOUR TEETH THE MORNING OF SURGERY WITH YOUR REGULAR TOOTHPASTE  DENTURES WILL BE REMOVED PRIOR TO SURGERY PLEASE DO NOT APPLY "Poly grip" OR ADHESIVES!!!  Do NOT smoke after Midnight   Take these medicines the morning of surgery with A SIP OF WATER: SYNTHROID, PANTOPRAZOLE, CITALOPRAM, TRAMADOL IF NEEDED, INHALER BRING WITH YOU    Bring CPAP mask and tubing day of surgery.                              You may not have any metal on your body including hair pins, jewelry, and body piercing             Do not wear make-up, lotions, powders, perfumes/cologne, or deodorant  Do not wear nail polish including gel and S&S, artificial/acrylic nails, or any other type of covering on natural nails including finger and toenails. If you have artificial nails, gel coating, etc. that needs to be removed by a nail salon please have this removed prior to surgery or surgery may need to be canceled/ delayed if the surgeon/ anesthesia feels like they are unable to be safely monitored.   Do not shave  48 hours prior to surgery.    Do not bring valuables to the hospital. [place].   Contacts, glasses, dentures or bridgework may not be worn into surgery.   Bring small overnight bag day of surgery.   DO NOT [place]. PHARMACY WILL DISPENSE MEDICATIONS LISTED ON YOUR MEDICATION LIST TO YOU DURING YOUR ADMISSION [place]!    Patients discharged on the day of surgery will not be allowed to drive home.  Someone NEEDS to stay with you for the first 24 hours after anesthesia.                 Please read over the following fact sheets you were given: IF [place] [id number]   If you received a COVID test  during your pre-op visit  it is requested that you wear a mask when out in public, stay away from anyone that may not be feeling well and notify your surgeon if you develop symptoms. If you test positive for Covid or have been in contact with anyone that has tested positive in the last 10 days please notify you surgeon.    [place] - Preparing for Surgery Before surgery, you can play an important role.  Because skin is not sterile, your skin needs to be as free of germs as possible.  You can reduce the number of germs on your skin by washing with CHG (chlorahexidine gluconate) soap before surgery.  CHG is an antiseptic cleaner which kills germs and bonds with the skin to continue killing germs even after washing. Please DO NOT use if you have an allergy to CHG or antibacterial soaps.  If your skin becomes reddened/irritated stop using the CHG and inform your nurse when you arrive at Short Stay. Do not shave (including legs and underarms) for at least 48 hours prior to the first CHG shower.  You may shave your face/neck. Please follow these instructions carefully:  1.  Shower with CHG Soap the night before surgery and the  morning of Surgery.  2.  If you choose to wash your hair, wash your hair first as usual with your  normal  shampoo.  3.  After you shampoo, rinse your hair and body thoroughly to remove the  shampoo.  4.  Use CHG as you would any other liquid soap.  You can apply chg directly  to the skin and wash                       Gently with a scrungie or clean washcloth.  5.  Apply the CHG Soap to your body ONLY FROM THE NECK DOWN.   Do not use on face/ open                           Wound or open sores. Avoid contact with eyes, ears mouth and genitals (private parts).                       Wash face,  Genitals (private parts) with your normal soap.             6.  Wash thoroughly, paying special attention to the area where your surgery  will be performed.  7.   Thoroughly rinse your body with warm water from the neck down.  8.  DO NOT shower/wash with your normal soap after using and rinsing off  the CHG Soap.                9.  Pat yourself dry with a clean towel.            10.  Wear clean pajamas.            11.  Place clean sheets on your bed the night of your first shower and do not  sleep with pets. Day of Surgery : Do not apply any lotions/deodorants the morning of surgery.  Please wear clean clothes to the hospital/surgery center.  FAILURE TO FOLLOW THESE INSTRUCTIONS MAY RESULT IN THE CANCELLATION OF YOUR SURGERY PATIENT SIGNATURE_________________________________  NURSE SIGNATURE__________________________________  ________________________________________________________________________  Rogelia Mire  An incentive spirometer is a tool that can help keep your lungs clear and active. This tool measures how well you are filling your lungs with each breath. Taking long deep breaths may help reverse or decrease the chance of developing breathing (pulmonary) problems (especially infection) following: A long period of time when you are unable to move or be active. BEFORE THE PROCEDURE  If the spirometer includes an indicator to show your best effort, your nurse or respiratory therapist will set it to a desired goal. If possible, sit up straight or lean slightly forward. Try not to slouch. Hold the incentive spirometer in an upright position. INSTRUCTIONS FOR USE  Sit on the edge of your bed if possible, or sit up as far as you can in bed or on a chair. Hold the incentive spirometer in an upright position. Breathe out normally. Place the mouthpiece in your mouth and seal your lips tightly around it. Breathe in slowly and as deeply as possible, raising the piston or the ball toward the top of the column. Hold your breath for 3-5 seconds or for as long as possible. Allow the piston or ball to fall to the bottom of the column. Remove the  mouthpiece from your mouth and breathe out normally. Rest for a few seconds and repeat Steps 1 through 7 at least 10 times every 1-2 hours when you are awake. Take your time and take a few normal breaths between deep breaths. The spirometer may include an indicator to show your best effort. Use the indicator as a goal to work toward during  each repetition. After each set of 10 deep breaths, practice coughing to be sure your lungs are clear. If you have an incision (the cut made at the time of surgery), support your incision when coughing by placing a pillow or rolled up towels firmly against it. Once you are able to get out of bed, walk around indoors and cough well. You may stop using the incentive spirometer when instructed by your caregiver.  RISKS AND COMPLICATIONS Take your time so you do not get dizzy or light-headed. If you are in pain, you may need to take or ask for pain medication before doing incentive spirometry. It is harder to take a deep breath if you are having pain. AFTER USE Rest and breathe slowly and easily. It can be helpful to keep track of a log of your progress. Your caregiver can provide you with a simple table to help with this. If you are using the spirometer at home, follow these instructions: [place] IF:  You are having difficultly using the spirometer. You have trouble using the spirometer as often as instructed. Your pain medication is not giving enough relief while using the spirometer. You develop fever of 100.5 F (38.1 C) or higher. SEEK IMMEDIATE MEDICAL CARE IF:  You cough up bloody sputum that had not been present before. You develop fever of 102 F (38.9 C) or greater. You develop worsening pain at or near the incision site. MAKE SURE YOU:  Understand these instructions. Will watch your condition. Will get help right away if you are not doing well or get worse. Document Released: 11/05/2006 Document Revised: 09/17/2011 Document Reviewed:  01/06/2007 ExitCare Patient Information 2014 ExitCare, [place].   ________________________________________________________________________ WHAT IS A BLOOD TRANSFUSION? Blood Transfusion Information  A transfusion is the replacement of blood or some of its parts. Blood is made up of multiple cells which provide different functions. Red blood cells carry oxygen and are used for blood loss replacement. White blood cells fight against infection. Platelets control bleeding. Plasma helps clot blood. Other blood products are available for specialized needs, such as hemophilia or other clotting disorders. BEFORE THE TRANSFUSION  Who gives blood for transfusions?  Healthy volunteers who are fully evaluated to make sure their blood is safe. This is blood bank blood. Transfusion therapy is the safest it has ever been in the practice of medicine. Before blood is taken from a donor, a complete history is taken to make sure that person has no history of diseases nor engages in risky social behavior (examples are intravenous drug use or sexual activity with multiple partners). The donor's travel history is screened to minimize risk of transmitting infections, such as malaria. The donated blood is tested for signs of infectious diseases, such as HIV and hepatitis. The blood is then tested to be sure it is compatible with you in order to minimize the chance of a transfusion reaction. If you or a relative donates blood, this is often done in anticipation of surgery and is not appropriate for emergency situations. It takes many days to process the donated blood. RISKS AND COMPLICATIONS Although transfusion therapy is very safe and saves many lives, the main dangers of transfusion include:  Getting an infectious disease. Developing a transfusion reaction. This is an allergic reaction to something in the blood you were given. Every precaution is taken to prevent this. The decision to have a blood transfusion has been  considered carefully by your caregiver before blood is given. Blood is not given unless  the benefits outweigh the risks. AFTER THE TRANSFUSION Right after receiving a blood transfusion, you will usually feel much better and more energetic. This is especially true if your red blood cells have gotten low (anemic). The transfusion raises the level of the red blood cells which carry oxygen, and this usually causes an energy increase. The nurse administering the transfusion will monitor you carefully for complications. HOME CARE INSTRUCTIONS  No special instructions are needed after a transfusion. You may find your energy is better. Speak with your caregiver about any limitations on activity for underlying diseases you may have. SEEK MEDICAL CARE IF:  Your condition is not improving after your transfusion. You develop redness or irritation at the intravenous (IV) site. SEEK IMMEDIATE MEDICAL CARE IF:  Any of the following symptoms occur over the next 12 hours: Shaking chills. You have a temperature by mouth above 102 F (38.9 C), not controlled by medicine. Chest, back, or muscle pain. People around you feel you are not acting correctly or are confused. Shortness of breath or difficulty breathing. Dizziness and fainting. You get a rash or develop hives. You have a decrease in urine output. Your urine turns a dark color or changes to pink, red, or brown. Any of the following symptoms occur over the next 10 days: You have a temperature by mouth above 102 F (38.9 C), not controlled by medicine. Shortness of breath. Weakness after normal activity. The white part of the eye turns yellow (jaundice). You have a decrease in the amount of urine or are urinating less often. Your urine turns a dark color or changes to pink, red, or brown. Document Released: 06/22/2000 Document Revised: 09/17/2011 Document Reviewed: 02/09/2008 [facility] Patient Information 2014 [place],  [place].  _______________________________________________________________________

## 2022-06-15 NOTE — Progress Notes (Signed)
PCP - Barbara Butter, NP clearance 04-11-22 epic Cardiologist - no PULM-LOV 04-11-22 Ames Dura, NP clearance  PPM/ICD -  Device Orders -  Rep Notified -   Chest x-ray -  EKG -  Stress Test -  ECHO - 2021 CE Cardiac Cath -   Sleep Study - home test 03-29-22 mild Osa no cpap,  CPAP - no  Fasting Blood Sugar -  Checks Blood Sugar _____ times a day  Blood Thinner Instructions: Aspirin Instructions:  ERAS Protcol - PRE-SURGERY Ensure    COVID vaccine -  Activity--Able to complete ADL's without SOB  Anesthesia review: murmur, Mild OSA phentermine last dose 06-27-22  Patient denies shortness of breath, fever, cough and chest pain at PAT appointment   All instructions explained to the patient, with a verbal understanding of the material. Patient agrees to go over the instructions while at home for a better understanding. Patient also instructed to self quarantine after being tested for COVID-19. The opportunity to ask questions was provided.

## 2022-06-18 ENCOUNTER — Encounter: Payer: Self-pay | Admitting: Orthopaedic Surgery

## 2022-06-19 ENCOUNTER — Encounter (HOSPITAL_COMMUNITY)
Admission: RE | Admit: 2022-06-19 | Discharge: 2022-06-19 | Disposition: A | Discharge status: Home / Self Care | Payer: Medicare Other | Source: Ambulatory Visit | Attending: Orthopaedic Surgery | Admitting: Orthopaedic Surgery

## 2022-06-19 ENCOUNTER — Other Ambulatory Visit: Payer: Self-pay

## 2022-06-19 ENCOUNTER — Encounter (HOSPITAL_COMMUNITY): Payer: Self-pay

## 2022-06-19 ENCOUNTER — Other Ambulatory Visit: Payer: Self-pay | Admitting: Medical-Surgical

## 2022-06-19 VITALS — BP 127/86 | HR 52 | Temp 98.0°F | Resp 16 | Ht 64.0 in | Wt 222.0 lb

## 2022-06-19 DIAGNOSIS — Z01818 Encounter for other preprocedural examination: Secondary | ICD-10-CM

## 2022-06-19 DIAGNOSIS — Z01812 Encounter for preprocedural laboratory examination: Secondary | ICD-10-CM | POA: Insufficient documentation

## 2022-06-19 HISTORY — DX: Hypothyroidism, unspecified: E03.9

## 2022-06-19 HISTORY — DX: Pneumonia, unspecified organism: J18.9

## 2022-06-19 LAB — COMPREHENSIVE METABOLIC PANEL
ALT: 14 U/L (ref 0–44)
AST: 14 U/L — ABNORMAL LOW (ref 15–41)
Albumin: 4.2 g/dL (ref 3.5–5.0)
Alkaline Phosphatase: 64 U/L (ref 38–126)
Anion gap: 6 (ref 5–15)
BUN: 17 mg/dL (ref 6–20)
CO2: 24 mmol/L (ref 22–32)
Calcium: 9.4 mg/dL (ref 8.9–10.3)
Chloride: 111 mmol/L (ref 98–111)
Creatinine, Ser: 0.8 mg/dL (ref 0.44–1.00)
GFR, Estimated: >60 mL/min (ref >60)
Glucose, Bld: 106 mg/dL — ABNORMAL HIGH (ref 70–99)
Potassium: 4 mmol/L (ref 3.5–5.1)
Sodium: 141 mmol/L (ref 135–145)
Total Bilirubin: 0.6 mg/dL (ref 0.3–1.2)
Total Protein: 7.1 g/dL (ref 6.5–8.1)

## 2022-06-19 LAB — CBC
HCT: 43.2 % (ref 36.0–46.0)
Hemoglobin: 13.9 g/dL (ref 12.0–15.0)
MCH: 31.2 pg (ref 26.0–34.0)
MCHC: 32.2 g/dL (ref 30.0–36.0)
MCV: 96.9 fL (ref 80.0–100.0)
Platelets: 272 10*3/uL (ref 150–400)
RBC: 4.46 MIL/uL (ref 3.87–5.11)
RDW: 13.2 % (ref 11.5–15.5)
WBC: 5.1 10*3/uL (ref 4.0–10.5)
nRBC: 0 % (ref 0.0–0.2)

## 2022-06-19 LAB — SURGICAL PCR SCREEN
MRSA, PCR: NEGATIVE
Staphylococcus aureus: NEGATIVE

## 2022-06-21 ENCOUNTER — Telehealth: Payer: Self-pay | Admitting: *Deleted

## 2022-06-21 ENCOUNTER — Other Ambulatory Visit: Payer: Self-pay | Admitting: *Deleted

## 2022-06-21 DIAGNOSIS — M1711 Unilateral primary osteoarthritis, right knee: Secondary | ICD-10-CM

## 2022-06-21 NOTE — Care Plan (Signed)
[facility] RNCM call to patient to discuss her upcoming Right total knee arthroplasty with Dr. Magnus Ivan on 06/22/22. She is an Ortho bundle patient through [facility] and is agreeable to case management. She lives with her husband and he will be assisting along with other family members at discharge. Her plan is to return home. She will need a RW before discharge. Anticipate HHPT will be needed after short hospital stay. Referral made to [facility] after choice provided. Reviewed all post op care instructions. Will continue to follow for needs.

## 2022-06-21 NOTE — Telephone Encounter (Signed)
Ortho bundle pre-op call completed. 

## 2022-06-21 NOTE — H&P (Signed)
TOTAL KNEE ADMISSION H&P  Patient is being admitted for right total knee arthroplasty.  Subjective:  Chief Complaint:right knee pain.  HPI: Barbara Calderon, 56 y.o. female, has a history of pain and functional disability in the right knee due to arthritis and has failed non-surgical conservative treatments for greater than 12 weeks to includeNSAID's and/or analgesics, corticosteriod injections, viscosupplementation injections, flexibility and strengthening excercises, use of assistive devices, weight reduction as appropriate, and activity modification.  Onset of symptoms was gradual, starting 4 years ago with gradually worsening course since that time. The patient noted no past surgery on the right knee(s).  Patient currently rates pain in the right knee(s) at 10 out of 10 with activity. Patient has night pain, worsening of pain with activity and weight bearing, pain that interferes with activities of daily living, pain with passive range of motion, crepitus, and joint swelling.  Patient has evidence of subchondral sclerosis, periarticular osteophytes, and joint space narrowing by imaging studies. There is no active infection.  Patient Active Problem List   Diagnosis Date Noted   Preoperative clearance 04/11/2022   Primary osteoarthritis of right knee 12/12/2021   Primary osteoarthritis of left ankle 12/12/2021   Abnormal finding on MRI of brain 08/03/2021   Chronic intractable headache 08/03/2021   Mild sleep apnea 04/15/2019   Chronic venous insufficiency 11/12/2014   Family history of DVT 11/12/2014   Lymphedema 11/12/2014   Vaginal vault prolapse 11/06/2013   SUI (stress urinary incontinence, female) 08/03/2013   Obesity, morbid, BMI 40.0-49.9 (HCC) 08/03/2013   Cystocele 08/03/2013   Asthma 09/20/2011   Depression 08/22/2011   History of gastroesophageal reflux (GERD) 08/22/2011   SHINGLES 08/08/2007   Hypothyroidism 05/22/2007   POLYARTHRALGIA 05/22/2007   LEG EDEMA 05/22/2007    URINARY FREQUENCY 05/22/2007   Past Medical History:  Diagnosis Date   Abnormal mammogram    Abnormal Pap smear of cervix    Anxiety    Arthritis    Asthma    Depression    GERD (gastroesophageal reflux disease)    Hashimoto's disease    Heart murmur    Echo was good per pt.   Hyperlipidemia    borderline- no meds   Hypothyroidism    Lymphedema    Lymphedema    bilateral legs   Lymphedema    legs   Neuromuscular disorder (HCC)    fibro many years ago told this, neurpathy legs   Pneumonia    PONV (postoperative nausea and vomiting)    slow to wake up   Sleep apnea    mild no cpap   Thyroid disease    Hashimotos   Varicose veins     Past Surgical History:  Procedure Laterality Date   ABDOMINAL HYSTERECTOMY     partial   APPLICATION OF WOUND VAC     on left lower leg above foot   BLADDER SUSPENSION     CESAREAN SECTION     x2   CHOLECYSTECTOMY OPEN     INCISION AND DRAINAGE OF WOUND     PARTIAL HYSTERECTOMY     SKIN GRAFT     TUBAL LIGATION      No current facility-administered medications for this encounter.   Current Outpatient Medications  Medication Sig Dispense Refill Last Dose   albuterol (VENTOLIN HFA) 108 (90 Base) MCG/ACT inhaler USE 2 INHALATIONS BY MOUTH  EVERY 4 HOURS AS NEEDED FOR WHEEZING 51 g 3    aspirin EC 81 MG tablet Take 81 mg by mouth  daily. Swallow whole.      citalopram (CELEXA) 40 MG tablet TAKE 1 TABLET BY MOUTH DAILY 90 tablet 3    diclofenac Sodium (VOLTAREN) 1 % GEL Apply 1 Application topically 2 (two) times daily as needed (knee/ankle pain).      furosemide (LASIX) 20 MG tablet Take 1 tablet by mouth daily as needed for fluid or edema.      gabapentin (NEURONTIN) 100 MG capsule Take 1 capsule (100 mg total) by mouth daily. (Patient taking differently: Take 100 mg by mouth daily. May take an additional 100 mg in the afternoon if needed for leg pain) 90 capsule 90    gabapentin (NEURONTIN) 300 MG capsule TAKE 1 CAPSULE BY MOUTH IN   THE EVENING (Patient taking differently: Take 300 mg by mouth at bedtime.) 90 capsule 3    lidocaine (LIDODERM) 5 % Place 1 patch onto the skin daily. Remove & Discard patch within 12 hours or as directed by MD 30 patch 0    meloxicam (MOBIC) 15 MG tablet One tab PO every 24 hours with a meal for 2 weeks, then once every 24 hours prn pain. (Patient taking differently: Take 15 mg by mouth daily.) 90 tablet 3    oxybutynin (DITROPAN XL) 10 MG 24 hr tablet Take 1 tablet (10 mg total) by mouth at bedtime. 90 tablet 1    potassium chloride SA (KLOR-CON) 20 MEQ tablet Take 1 tablet by mouth daily as needed (ONLY ON THE DAYS OF TAKING LASIX).      SYNTHROID 125 MCG tablet TAKE 1 TABLET BY MOUTH DAILY  BEFORE BREAKFAST 90 tablet 3    traMADol (ULTRAM) 50 MG tablet Take 1 tablet (50 mg total) by mouth every 6 (six) hours as needed. 60 tablet 0    AMBULATORY NON FORMULARY MEDICATION Knee-high, medium compression, graduated compression stockings. Apply to lower extremities. [email or web address], Zippered Compression Stockings, medium circ, long length 1 each 0    pantoprazole (PROTONIX) 40 MG tablet TAKE 1 TABLET BY MOUTH DAILY 90 tablet 3    phentermine 15 MG capsule Take 1 capsule (15 mg total) by mouth every morning. 30 capsule 0    Allergies  Allergen Reactions   Hydrocodone-Acetaminophen Hives and Itching   Latex Rash    Social History   Tobacco Use   Smoking status: Never   Smokeless tobacco: Never  Substance Use Topics   Alcohol use: Yes    Comment: rare 3-4 a year    Family History  Problem Relation Age of Onset   Colon polyps Mother    Stroke Mother    Hypertension Mother    Colon polyps Father    Prostate cancer Father    Colon cancer Neg Hx    Esophageal cancer Neg Hx    Stomach cancer Neg Hx    Rectal cancer Neg Hx      Review of Systems  Musculoskeletal:  Positive for gait problem and joint swelling.  All other systems reviewed and are negative.   Objective:  Physical  Exam Vitals reviewed.  Constitutional:      Appearance: Normal appearance. She is obese.  HENT:     Head: Normocephalic and atraumatic.  Eyes:     Extraocular Movements: Extraocular movements intact.     Pupils: Pupils are equal, round, and reactive to light.  Cardiovascular:     Rate and Rhythm: Normal rate and regular rhythm.  Pulmonary:     Effort: Pulmonary effort is normal.     Breath  sounds: Normal breath sounds.  Abdominal:     Palpations: Abdomen is soft.  Musculoskeletal:     Cervical back: Normal range of motion and neck supple.     Right knee: Effusion, bony tenderness and crepitus present. Decreased range of motion. Tenderness present over the medial joint line and lateral joint line. Abnormal alignment.  Neurological:     Mental Status: She is alert and oriented to person, place, and time.  Psychiatric:        Behavior: Behavior normal.     Vital signs in last 24 hours:    Labs:   Estimated body mass index is 38.11 kg/m as calculated from the following:   Height as of 06/19/22: 5\' 4"  (1.626 m).   Weight as of 06/19/22: 100.7 kg.   Imaging Review Plain radiographs demonstrate severe degenerative joint disease of the right knee(s). The overall alignment issignificant varus. The bone quality appears to be good for age and reported activity level.      Assessment/Plan:  End stage arthritis, right knee   The patient history, physical examination, clinical judgment of the provider and imaging studies are consistent with end stage degenerative joint disease of the right knee(s) and total knee arthroplasty is deemed medically necessary. The treatment options including medical management, injection therapy arthroscopy and arthroplasty were discussed at length. The risks and benefits of total knee arthroplasty were presented and reviewed. The risks due to aseptic loosening, infection, stiffness, patella tracking problems, thromboembolic complications and other  imponderables were discussed. The patient acknowledged the explanation, agreed to proceed with the plan and consent was signed. Patient is being admitted for inpatient treatment for surgery, pain control, PT, OT, prophylactic antibiotics, VTE prophylaxis, progressive ambulation and ADL's and discharge planning. The patient is planning to be discharged home with home health services

## 2022-06-21 NOTE — Telephone Encounter (Signed)
Attempted Ortho bundle pre-op call to patient; no answer and left VM requesting call back. 

## 2022-06-22 ENCOUNTER — Ambulatory Visit (HOSPITAL_BASED_OUTPATIENT_CLINIC_OR_DEPARTMENT_OTHER): Payer: Medicare Other | Admitting: Anesthesiology

## 2022-06-22 ENCOUNTER — Ambulatory Visit (HOSPITAL_COMMUNITY): Payer: Medicare Other | Admitting: Physician Assistant

## 2022-06-22 ENCOUNTER — Encounter (HOSPITAL_COMMUNITY): Payer: Self-pay | Admitting: Orthopaedic Surgery

## 2022-06-22 ENCOUNTER — Other Ambulatory Visit: Payer: Self-pay

## 2022-06-22 ENCOUNTER — Observation Stay (HOSPITAL_COMMUNITY): Payer: Medicare Other

## 2022-06-22 ENCOUNTER — Encounter (HOSPITAL_COMMUNITY)
Admission: RE | Disposition: A | Discharge status: Home Health | Payer: Self-pay | Source: Ambulatory Visit | Attending: Orthopaedic Surgery

## 2022-06-22 ENCOUNTER — Observation Stay (HOSPITAL_COMMUNITY)
Admission: RE | Admit: 2022-06-22 | Discharge: 2022-06-23 | Disposition: A | Discharge status: Home Health | Payer: Medicare Other | Source: Ambulatory Visit | Attending: Orthopaedic Surgery | Admitting: Orthopaedic Surgery

## 2022-06-22 DIAGNOSIS — Z471 Aftercare following joint replacement surgery: Secondary | ICD-10-CM | POA: Diagnosis not present

## 2022-06-22 DIAGNOSIS — G4730 Sleep apnea, unspecified: Secondary | ICD-10-CM

## 2022-06-22 DIAGNOSIS — E039 Hypothyroidism, unspecified: Secondary | ICD-10-CM | POA: Diagnosis not present

## 2022-06-22 DIAGNOSIS — J45909 Unspecified asthma, uncomplicated: Secondary | ICD-10-CM

## 2022-06-22 DIAGNOSIS — M1711 Unilateral primary osteoarthritis, right knee: Secondary | ICD-10-CM | POA: Diagnosis not present

## 2022-06-22 DIAGNOSIS — Z79899 Other long term (current) drug therapy: Secondary | ICD-10-CM | POA: Insufficient documentation

## 2022-06-22 DIAGNOSIS — Z96651 Presence of right artificial knee joint: Secondary | ICD-10-CM | POA: Diagnosis not present

## 2022-06-22 DIAGNOSIS — F418 Other specified anxiety disorders: Secondary | ICD-10-CM

## 2022-06-22 DIAGNOSIS — Z7982 Long term (current) use of aspirin: Secondary | ICD-10-CM | POA: Diagnosis not present

## 2022-06-22 DIAGNOSIS — Z91040 Latex allergy status: Secondary | ICD-10-CM | POA: Insufficient documentation

## 2022-06-22 DIAGNOSIS — G8918 Other acute postprocedural pain: Secondary | ICD-10-CM | POA: Diagnosis not present

## 2022-06-22 HISTORY — PX: TOTAL KNEE ARTHROPLASTY: SHX125

## 2022-06-22 LAB — TYPE AND SCREEN
ABO/RH(D): A POS
Antibody Screen: NEGATIVE

## 2022-06-22 LAB — ABO/RH: ABO/RH(D): A POS

## 2022-06-22 SURGERY — ARTHROPLASTY, KNEE, TOTAL
Anesthesia: Regional | Site: Knee | Laterality: Right

## 2022-06-22 MED ORDER — CEFAZOLIN SODIUM-DEXTROSE 2-4 GM/100ML-% IV SOLN
2.0000 g | INTRAVENOUS | Status: AC
  Administered 2022-06-22: 2 g via INTRAVENOUS
  Filled 2022-06-22: qty 100

## 2022-06-22 MED ORDER — DOCUSATE SODIUM 100 MG PO CAPS
100.0000 mg | ORAL_CAPSULE | Freq: Two times a day (BID) | ORAL | Status: DC
  Administered 2022-06-22 – 2022-06-23 (×2): 100 mg via ORAL
  Filled 2022-06-22 (×2): qty 1

## 2022-06-22 MED ORDER — PROPOFOL 500 MG/50ML IV EMUL
INTRAVENOUS | Status: DC | PRN
  Administered 2022-06-22: 50 ug/kg/min via INTRAVENOUS

## 2022-06-22 MED ORDER — OXYCODONE HCL 5 MG PO TABS
ORAL_TABLET | ORAL | Status: AC
  Filled 2022-06-22: qty 2

## 2022-06-22 MED ORDER — PHENOL 1.4 % MT LIQD: 1.0000 | OROMUCOSAL | Status: DC | PRN

## 2022-06-22 MED ORDER — METOCLOPRAMIDE HCL 5 MG PO TABS: 5.0000 mg | ORAL_TABLET | Freq: Three times a day (TID) | ORAL | Status: DC | PRN

## 2022-06-22 MED ORDER — ALBUTEROL SULFATE (2.5 MG/3ML) 0.083% IN NEBU: 2.5000 mg | INHALATION_SOLUTION | RESPIRATORY_TRACT | Status: DC | PRN

## 2022-06-22 MED ORDER — ONDANSETRON HCL 4 MG/2ML IJ SOLN
INTRAMUSCULAR | Status: DC | PRN
  Administered 2022-06-22: 4 mg via INTRAVENOUS

## 2022-06-22 MED ORDER — PHENYLEPHRINE HCL-NACL 20-0.9 MG/250ML-% IV SOLN
INTRAVENOUS | Status: AC
  Filled 2022-06-22: qty 250

## 2022-06-22 MED ORDER — SODIUM CHLORIDE 0.9 % IV SOLN: INTRAVENOUS | Status: DC

## 2022-06-22 MED ORDER — OXYCODONE HCL 5 MG PO TABS
5.0000 mg | ORAL_TABLET | ORAL | Status: DC | PRN
  Administered 2022-06-22 – 2022-06-23 (×6): 10 mg via ORAL
  Filled 2022-06-22 (×5): qty 2

## 2022-06-22 MED ORDER — LEVOTHYROXINE SODIUM 125 MCG PO TABS
125.0000 ug | ORAL_TABLET | Freq: Every day | ORAL | Status: DC
  Administered 2022-06-23: 125 ug via ORAL
  Filled 2022-06-22: qty 1

## 2022-06-22 MED ORDER — FENTANYL CITRATE PF 50 MCG/ML IJ SOSY: 25.0000 ug | PREFILLED_SYRINGE | INTRAMUSCULAR | Status: DC | PRN

## 2022-06-22 MED ORDER — POVIDONE-IODINE 10 % EX SWAB
2.0000 | Freq: Once | CUTANEOUS | Status: AC
  Administered 2022-06-22: 2 via TOPICAL

## 2022-06-22 MED ORDER — 0.9 % SODIUM CHLORIDE (POUR BTL) OPTIME
TOPICAL | Status: DC | PRN
  Administered 2022-06-22: 1000 mL

## 2022-06-22 MED ORDER — OXYBUTYNIN CHLORIDE ER 5 MG PO TB24
10.0000 mg | ORAL_TABLET | Freq: Every day | ORAL | Status: DC
  Administered 2022-06-22: 10 mg via ORAL
  Filled 2022-06-22: qty 2

## 2022-06-22 MED ORDER — BUPIVACAINE-EPINEPHRINE (PF) 0.5% -1:200000 IJ SOLN
INTRAMUSCULAR | Status: DC | PRN
  Administered 2022-06-22: 30 mL via PERINEURAL

## 2022-06-22 MED ORDER — PROPOFOL 10 MG/ML IV BOLUS
INTRAVENOUS | Status: AC
  Filled 2022-06-22: qty 20

## 2022-06-22 MED ORDER — CEFAZOLIN SODIUM-DEXTROSE 1-4 GM/50ML-% IV SOLN
1.0000 g | Freq: Four times a day (QID) | INTRAVENOUS | Status: AC
  Administered 2022-06-22 (×2): 1 g via INTRAVENOUS
  Filled 2022-06-22 (×2): qty 50

## 2022-06-22 MED ORDER — FENTANYL CITRATE PF 50 MCG/ML IJ SOSY
50.0000 ug | PREFILLED_SYRINGE | INTRAMUSCULAR | Status: DC
  Filled 2022-06-22: qty 2

## 2022-06-22 MED ORDER — METHOCARBAMOL 500 MG PO TABS
500.0000 mg | ORAL_TABLET | Freq: Four times a day (QID) | ORAL | Status: DC | PRN
  Administered 2022-06-22 – 2022-06-23 (×3): 500 mg via ORAL
  Filled 2022-06-22 (×2): qty 1

## 2022-06-22 MED ORDER — ONDANSETRON HCL 4 MG/2ML IJ SOLN
INTRAMUSCULAR | Status: AC
  Filled 2022-06-22: qty 2

## 2022-06-22 MED ORDER — PANTOPRAZOLE SODIUM 40 MG PO TBEC
40.0000 mg | DELAYED_RELEASE_TABLET | Freq: Every day | ORAL | Status: DC
  Administered 2022-06-23: 40 mg via ORAL
  Filled 2022-06-22: qty 1

## 2022-06-22 MED ORDER — TRANEXAMIC ACID-NACL 1000-0.7 MG/100ML-% IV SOLN
1000.0000 mg | INTRAVENOUS | Status: AC
  Administered 2022-06-22: 1000 mg via INTRAVENOUS
  Filled 2022-06-22: qty 100

## 2022-06-22 MED ORDER — ONDANSETRON HCL 4 MG PO TABS
4.0000 mg | ORAL_TABLET | Freq: Four times a day (QID) | ORAL | Status: DC | PRN
  Administered 2022-06-23: 4 mg via ORAL
  Filled 2022-06-22: qty 1

## 2022-06-22 MED ORDER — PROMETHAZINE HCL 25 MG/ML IJ SOLN: 6.2500 mg | INTRAMUSCULAR | Status: DC | PRN

## 2022-06-22 MED ORDER — DIPHENHYDRAMINE HCL 12.5 MG/5ML PO ELIX
12.5000 mg | ORAL_SOLUTION | ORAL | Status: DC | PRN
  Administered 2022-06-23: 25 mg via ORAL
  Filled 2022-06-22: qty 10

## 2022-06-22 MED ORDER — DEXAMETHASONE SODIUM PHOSPHATE 10 MG/ML IJ SOLN
INTRAMUSCULAR | Status: DC | PRN
  Administered 2022-06-22: 10 mg via INTRAVENOUS

## 2022-06-22 MED ORDER — POTASSIUM CHLORIDE CRYS ER 20 MEQ PO TBCR: 20.0000 meq | EXTENDED_RELEASE_TABLET | Freq: Every day | ORAL | Status: DC | PRN

## 2022-06-22 MED ORDER — MENTHOL 3 MG MT LOZG: 1.0000 | LOZENGE | OROMUCOSAL | Status: DC | PRN

## 2022-06-22 MED ORDER — PROPOFOL 1000 MG/100ML IV EMUL
INTRAVENOUS | Status: AC
  Filled 2022-06-22: qty 100

## 2022-06-22 MED ORDER — METHOCARBAMOL 500 MG PO TABS
ORAL_TABLET | ORAL | Status: AC
  Filled 2022-06-22: qty 1

## 2022-06-22 MED ORDER — LIDOCAINE 2% (20 MG/ML) 5 ML SYRINGE
INTRAMUSCULAR | Status: DC | PRN
  Administered 2022-06-22: 100 mg via INTRAVENOUS

## 2022-06-22 MED ORDER — BUPIVACAINE HCL (PF) 0.25 % IJ SOLN
INTRAMUSCULAR | Status: AC
  Filled 2022-06-22: qty 30

## 2022-06-22 MED ORDER — OXYCODONE HCL 5 MG/5ML PO SOLN: 5.0000 mg | Freq: Once | ORAL | Status: DC | PRN

## 2022-06-22 MED ORDER — CITALOPRAM HYDROBROMIDE 20 MG PO TABS
40.0000 mg | ORAL_TABLET | Freq: Every day | ORAL | Status: DC
  Administered 2022-06-23: 40 mg via ORAL
  Filled 2022-06-22: qty 2

## 2022-06-22 MED ORDER — DEXAMETHASONE SODIUM PHOSPHATE 10 MG/ML IJ SOLN
INTRAMUSCULAR | Status: AC
  Filled 2022-06-22: qty 1

## 2022-06-22 MED ORDER — EPHEDRINE 5 MG/ML INJ
INTRAVENOUS | Status: AC
  Filled 2022-06-22: qty 5

## 2022-06-22 MED ORDER — ACETAMINOPHEN 325 MG PO TABS
325.0000 mg | ORAL_TABLET | Freq: Four times a day (QID) | ORAL | Status: DC | PRN
  Administered 2022-06-22 – 2022-06-23 (×2): 650 mg via ORAL
  Filled 2022-06-22 (×2): qty 2

## 2022-06-22 MED ORDER — METHOCARBAMOL 500 MG IVPB - SIMPLE MED: 500.0000 mg | Freq: Four times a day (QID) | INTRAVENOUS | Status: DC | PRN

## 2022-06-22 MED ORDER — STERILE WATER FOR IRRIGATION IR SOLN
Status: DC | PRN
  Administered 2022-06-22: 2000 mL

## 2022-06-22 MED ORDER — CHLORHEXIDINE GLUCONATE 0.12 % MT SOLN
15.0000 mL | Freq: Once | OROMUCOSAL | Status: AC
  Administered 2022-06-22: 15 mL via OROMUCOSAL

## 2022-06-22 MED ORDER — ASPIRIN 81 MG PO CHEW
81.0000 mg | CHEWABLE_TABLET | Freq: Two times a day (BID) | ORAL | Status: DC
  Administered 2022-06-22 – 2022-06-23 (×2): 81 mg via ORAL
  Filled 2022-06-22 (×2): qty 1

## 2022-06-22 MED ORDER — BUPIVACAINE IN DEXTROSE 0.75-8.25 % IT SOLN
INTRATHECAL | Status: DC | PRN
  Administered 2022-06-22: 1.6 mL via INTRATHECAL

## 2022-06-22 MED ORDER — HYDROMORPHONE HCL 1 MG/ML IJ SOLN
0.5000 mg | INTRAMUSCULAR | Status: DC | PRN
  Administered 2022-06-22 – 2022-06-23 (×3): 1 mg via INTRAVENOUS
  Filled 2022-06-22 (×3): qty 1

## 2022-06-22 MED ORDER — METOCLOPRAMIDE HCL 5 MG/ML IJ SOLN: 5.0000 mg | Freq: Three times a day (TID) | INTRAMUSCULAR | Status: DC | PRN

## 2022-06-22 MED ORDER — ACETAMINOPHEN 500 MG PO TABS
ORAL_TABLET | ORAL | Status: AC
  Administered 2022-06-22: 1000 mg via ORAL
  Filled 2022-06-22: qty 2

## 2022-06-22 MED ORDER — HYDROMORPHONE HCL 2 MG PO TABS
2.0000 mg | ORAL_TABLET | ORAL | Status: DC | PRN
  Administered 2022-06-22 – 2022-06-23 (×3): 2 mg via ORAL
  Filled 2022-06-22: qty 2
  Filled 2022-06-22 (×3): qty 1
  Filled 2022-06-22: qty 2

## 2022-06-22 MED ORDER — ONDANSETRON HCL 4 MG/2ML IJ SOLN: 4.0000 mg | Freq: Four times a day (QID) | INTRAMUSCULAR | Status: DC | PRN

## 2022-06-22 MED ORDER — ORAL CARE MOUTH RINSE: 15.0000 mL | Freq: Once | OROMUCOSAL | Status: AC

## 2022-06-22 MED ORDER — ACETAMINOPHEN 500 MG PO TABS: 1000.0000 mg | ORAL_TABLET | Freq: Once | ORAL | Status: AC

## 2022-06-22 MED ORDER — POLYETHYLENE GLYCOL 3350 17 G PO PACK: 17.0000 g | PACK | Freq: Every day | ORAL | Status: DC | PRN

## 2022-06-22 MED ORDER — LACTATED RINGERS IV SOLN: INTRAVENOUS | Status: DC

## 2022-06-22 MED ORDER — AMISULPRIDE (ANTIEMETIC) 5 MG/2ML IV SOLN: 10.0000 mg | Freq: Once | INTRAVENOUS | Status: DC | PRN

## 2022-06-22 MED ORDER — EPHEDRINE SULFATE-NACL 50-0.9 MG/10ML-% IV SOSY
PREFILLED_SYRINGE | INTRAVENOUS | Status: DC | PRN
  Administered 2022-06-22: 10 mg via INTRAVENOUS
  Administered 2022-06-22: 5 mg via INTRAVENOUS

## 2022-06-22 MED ORDER — SODIUM CHLORIDE 0.9 % IR SOLN
Status: DC | PRN
  Administered 2022-06-22: 1000 mL

## 2022-06-22 MED ORDER — OXYCODONE HCL 5 MG PO TABS: 5.0000 mg | ORAL_TABLET | Freq: Once | ORAL | Status: DC | PRN

## 2022-06-22 MED ORDER — PROPOFOL 10 MG/ML IV BOLUS
INTRAVENOUS | Status: DC | PRN
  Administered 2022-06-22: 30 mg via INTRAVENOUS
  Administered 2022-06-22: 10 mg via INTRAVENOUS
  Administered 2022-06-22: 30 mg via INTRAVENOUS
  Administered 2022-06-22: 20 mg via INTRAVENOUS

## 2022-06-22 MED ORDER — MIDAZOLAM HCL 2 MG/2ML IJ SOLN
1.0000 mg | INTRAMUSCULAR | Status: AC
  Administered 2022-06-22: 1 mg via INTRAVENOUS
  Filled 2022-06-22: qty 2

## 2022-06-22 MED ORDER — ALUM & MAG HYDROXIDE-SIMETH 200-200-20 MG/5ML PO SUSP: 30.0000 mL | ORAL | Status: DC | PRN

## 2022-06-22 SURGICAL SUPPLY — 65 items
APL SKNCLS STERI-STRIP NONHPOA (GAUZE/BANDAGES/DRESSINGS)
BAG COUNTER SPONGE SURGICOUNT (BAG) IMPLANT
BAG SPEC THK2 15X12 ZIP CLS (MISCELLANEOUS) ×1
BAG SPNG CNTER NS LX DISP (BAG)
BAG ZIPLOCK 12X15 (MISCELLANEOUS) ×1 IMPLANT
BENZOIN TINCTURE PRP APPL 2/3 (GAUZE/BANDAGES/DRESSINGS) IMPLANT
BLADE SAG 18X100X1.27 (BLADE) ×1 IMPLANT
BLADE SURG SZ10 CARB STEEL (BLADE) ×2 IMPLANT
BNDG CMPR MED 10X6 ELC LF (GAUZE/BANDAGES/DRESSINGS) ×2
BNDG ELASTIC 6X10 VLCR STRL LF (GAUZE/BANDAGES/DRESSINGS) IMPLANT
BNDG ELASTIC 6X5.8 VLCR STR LF (GAUZE/BANDAGES/DRESSINGS) ×2 IMPLANT
BOWL SMART MIX CTS (DISPOSABLE) IMPLANT
BSPLAT TIB 5D D CMNT STM RT (Knees) ×1 IMPLANT
CEMENT BONE R 1X40 (Cement) IMPLANT
CEMENT BONE SIMPLEX SPEEDSET (Cement) IMPLANT
COOLER ICEMAN CLASSIC (MISCELLANEOUS) ×1 IMPLANT
COVER SURGICAL LIGHT HANDLE (MISCELLANEOUS) ×1 IMPLANT
CUFF TOURN SGL QUICK 34 (TOURNIQUET CUFF) ×1
CUFF TRNQT CYL 34X4.125X (TOURNIQUET CUFF) ×1 IMPLANT
DRAPE INCISE IOBAN 66X45 STRL (DRAPES) ×1 IMPLANT
DRAPE U-SHAPE 47X51 STRL (DRAPES) ×1 IMPLANT
DURAPREP 26ML APPLICATOR (WOUND CARE) ×1 IMPLANT
ELECT BLADE TIP CTD 4 INCH (ELECTRODE) ×1 IMPLANT
ELECT REM PT RETURN 15FT ADLT (MISCELLANEOUS) ×1 IMPLANT
FEMUR CMT CR STD SZ 6 RT KNEE (Joint) ×1 IMPLANT
FEMUR CMTD CR STD SZ 6 RT KNEE (Joint) IMPLANT
GAUZE PAD ABD 8X10 STRL (GAUZE/BANDAGES/DRESSINGS) ×2 IMPLANT
GAUZE SPONGE 4X4 12PLY STRL (GAUZE/BANDAGES/DRESSINGS) ×1 IMPLANT
GAUZE XEROFORM 1X8 LF (GAUZE/BANDAGES/DRESSINGS) IMPLANT
GLOVE BIO SURGEON STRL SZ7.5 (GLOVE) ×1 IMPLANT
GLOVE BIOGEL PI IND STRL 8 (GLOVE) ×2 IMPLANT
GLOVE ECLIPSE 8.0 STRL XLNG CF (GLOVE) ×1 IMPLANT
GOWN STRL REUS W/ TWL XL LVL3 (GOWN DISPOSABLE) ×2 IMPLANT
GOWN STRL REUS W/TWL XL LVL3 (GOWN DISPOSABLE) ×2
HANDPIECE INTERPULSE COAX TIP (DISPOSABLE) ×1
HDLS TROCR DRIL PIN KNEE 75 (PIN) ×1
HOLDER FOLEY CATH W/STRAP (MISCELLANEOUS) IMPLANT
IMMOBILIZER KNEE 20 (SOFTGOODS) ×1
IMMOBILIZER KNEE 20 THIGH 36 (SOFTGOODS) ×1 IMPLANT
KIT TURNOVER KIT A (KITS) IMPLANT
LINER TIB ASF PS CD/6-7 14 RT (Liner) IMPLANT
NS IRRIG 1000ML POUR BTL (IV SOLUTION) ×1 IMPLANT
PACK TOTAL KNEE CUSTOM (KITS) ×1 IMPLANT
PAD COLD SHLDR WRAP-ON (PAD) ×1 IMPLANT
PADDING CAST COTTON 6X4 STRL (CAST SUPPLIES) ×2 IMPLANT
PIN DRILL HDLS TROCAR 75 4PK (PIN) IMPLANT
PROTECTOR NERVE ULNAR (MISCELLANEOUS) ×1 IMPLANT
SCREW FEMALE HEX FIX 25X2.5 (ORTHOPEDIC DISPOSABLE SUPPLIES) IMPLANT
SCREW HEADED 33MM KNEE (MISCELLANEOUS) IMPLANT
SET HNDPC FAN SPRY TIP SCT (DISPOSABLE) ×1 IMPLANT
SET PAD KNEE POSITIONER (MISCELLANEOUS) ×1 IMPLANT
SPIKE FLUID TRANSFER (MISCELLANEOUS) IMPLANT
STAPLER VISISTAT 35W (STAPLE) IMPLANT
STEM POLY PAT PLY 32M KNEE (Knees) IMPLANT
STEM TIBIA 5 DEG SZ D R KNEE (Knees) IMPLANT
STRIP CLOSURE SKIN 1/2X4 (GAUZE/BANDAGES/DRESSINGS) IMPLANT
SUT MNCRL AB 4-0 PS2 18 (SUTURE) IMPLANT
SUT VIC AB 0 CT1 27 (SUTURE) ×1
SUT VIC AB 0 CT1 27XBRD ANTBC (SUTURE) ×1 IMPLANT
SUT VIC AB 1 CT1 36 (SUTURE) ×2 IMPLANT
SUT VIC AB 2-0 CT1 27 (SUTURE) ×2
SUT VIC AB 2-0 CT1 TAPERPNT 27 (SUTURE) ×2 IMPLANT
TIBIA STEM 5 DEG SZ D R KNEE (Knees) ×1 IMPLANT
TRAY FOLEY MTR SLVR 16FR STAT (SET/KITS/TRAYS/PACK) IMPLANT
WATER STERILE IRR 1000ML POUR (IV SOLUTION) ×2 IMPLANT

## 2022-06-22 NOTE — Evaluation (Signed)
Physical Therapy Evaluation Patient Details Name: Barbara Calderon MRN: [id number] DOB: 1966-02-05 Today's Date: 06/22/2022  History of Present Illness  Pt is a 56yo female presenting s/p R-Tka on 06/22/22. PMH: GERD, HLD, lymphedema, fibromyalgia.  Clinical Impression  Barbara Calderon is a 56 y.o. female POD 0 s/p R-TKA. Patient reports modified independence using SPC and rollator with mobility at baseline. Patient is now limited by functional impairments (see PT problem list below) and requires supervision for bed mobility and min guard for transfers. Patient was able to ambulate 15 feet with RW and min guard level of assist. Patient instructed in exercise to facilitate ROM and circulation to manage edema. Provided incentive spirometer and with Vcs pt able to achieve . Patient will benefit from continued skilled PT interventions to address impairments and progress towards PLOF. Acute PT will follow to progress mobility and stair training in preparation for safe discharge home.       Recommendations for follow up therapy are one component of a multi-disciplinary discharge planning process, led by the attending physician.  Recommendations may be updated based on patient status, additional functional criteria and insurance authorization.  Follow Up Recommendations Follow physician's recommendations for discharge plan and follow up therapies      Assistance Recommended at Discharge Frequent or constant Supervision/Assistance  Patient can return home with the following  A little help with walking and/or transfers;A little help with bathing/dressing/bathroom;Assistance with cooking/housework;Assist for transportation;Help with stairs or ramp for entrance    Equipment Recommendations Rolling walker (2 wheels)  Recommendations for Other Services       Functional Status Assessment Patient has had a recent decline in their functional status and demonstrates the ability to make significant  improvements in function in a reasonable and predictable amount of time.     Precautions / Restrictions Precautions Precautions: Fall Precaution Comments: no pillow under the knee Required Braces or Orthoses: Knee Immobilizer - Right Knee Immobilizer - Right: On when out of bed or walking;Discontinue post op day 2 Restrictions Weight Bearing Restrictions: No Other Position/Activity Restrictions: wbat      Mobility  Bed Mobility Overal bed mobility: Needs Assistance Bed Mobility: Supine to Sit     Supine to sit: Supervision, HOB elevated     General bed mobility comments: for safety only    Transfers Overall transfer level: Needs assistance Equipment used: Rolling walker (2 wheels) Transfers: Sit to/from Stand Sit to Stand: Min guard           General transfer comment: Pt required VCs for sequencing and powering up from bed and LLE, min guard for safety no physical assist required    Ambulation/Gait Ambulation/Gait assistance: Min guard, +2 safety/equipment Gait Distance (Feet): 15 Feet Assistive device: Rolling walker (2 wheels) Gait Pattern/deviations: Step-to pattern Gait velocity: decraesed     General Gait Details: Pt ambulated with RW and R-KI applied, min guard, no physical assist required or overt LOB noted, +2 for recliner follow  Stairs            Wheelchair Mobility    Modified Rankin (Stroke Patients Only)       Balance Overall balance assessment: Needs assistance Sitting-balance support: Feet supported, No upper extremity supported Sitting balance-Leahy Scale: Good     Standing balance support: Bilateral upper extremity supported, During functional activity, Reliant on assistive device for balance Standing balance-Leahy Scale: Poor  Pertinent Vitals/Pain Pain Assessment Pain Assessment: 0-10 Pain Score: 3  Pain Location: right knee Pain Descriptors / Indicators: Operative site  guarding Pain Intervention(s): Limited activity within patient's tolerance, Monitored during session, Repositioned, Ice applied    Home Living Family/patient expects to be discharged to:: Private residence Living Arrangements: Spouse/significant other;Children (Son is autistic, fostering two-year old) Available Help at Discharge: Family;Available 24 hours/day;Neighbor Type of Home: Mobile home Home Access: Stairs to enter Entrance Stairs-Rails: Right;Left;Can reach both Entrance Stairs-Number of Steps: 4   Home Layout: One level Home Equipment: Cane - single point      Prior Function Prior Level of Function : Independent/Modified Independent             Mobility Comments: SPC, standard walker, rollator for community mobility ADLs Comments: IND     Hand Dominance        Extremity/Trunk Assessment   Upper Extremity Assessment Upper Extremity Assessment: Overall WFL for tasks assessed    Lower Extremity Assessment Lower Extremity Assessment: RLE deficits/detail;LLE deficits/detail RLE Deficits / Details: MMT ank DF/PF 5/5, no extensor lag noted RLE Sensation: WNL LLE Deficits / Details: MMT ank DF/PF 5/5 LLE Sensation: WNL    Cervical / Trunk Assessment Cervical / Trunk Assessment: Normal  Communication   Communication: No difficulties  Cognition Arousal/Alertness: Awake/alert Behavior During Therapy: WFL for tasks assessed/performed Overall Cognitive Status: Within Functional Limits for tasks assessed                                          General Comments      Exercises Total Joint Exercises Ankle Circles/Pumps: AROM, Both, 10 reps   Assessment/Plan    PT Assessment Patient needs continued PT services  PT Problem List Decreased strength;Decreased range of motion;Decreased activity tolerance;Decreased balance;Decreased mobility;Decreased coordination;Pain       PT Treatment Interventions DME instruction;Gait training;Stair  training;Functional mobility training;Therapeutic activities;Therapeutic exercise;Balance training;Neuromuscular re-education;Patient/family education    PT Goals (Current goals can be found in the Care Plan section)  Acute Rehab PT Goals Patient Stated Goal: To play with her son PT Goal Formulation: With patient Time For Goal Achievement: 06/29/22 Potential to Achieve Goals: Good    Frequency 7X/week     Co-evaluation               AM-PAC PT "6 Clicks" Mobility  Outcome Measure Help needed turning from your back to your side while in a flat bed without using bedrails?: None Help needed moving from lying on your back to sitting on the side of a flat bed without using bedrails?: A Little Help needed moving to and from a bed to a chair (including a wheelchair)?: A Little Help needed standing up from a chair using your arms (e.g., wheelchair or bedside chair)?: A Little Help needed to walk in hospital room?: A Little Help needed climbing 3-5 steps with a railing? : A Little 6 Click Score: 19    End of Session Equipment Utilized During Treatment: Gait belt;Right knee immobilizer Activity Tolerance: Patient tolerated treatment well;No increased pain Patient left: in chair;with call bell/phone within reach;with chair alarm set;with SCD's reapplied Nurse Communication: Mobility status PT Visit Diagnosis: Pain;Difficulty in walking, not elsewhere classified (R26.2) Pain - Right/Left: Right Pain - part of body: Knee    Time: 1610-9604 PT Time Calculation (min) (ACUTE ONLY): 26 min   Charges:   PT Evaluation $PT Eval  Low Complexity: 1 Low          Jamesetta Geralds, [place], DPT [facility] Department Office: [phone] Weekend pager: [phone]  Jamesetta Geralds 06/22/2022, 5:27 PM

## 2022-06-22 NOTE — Anesthesia Preprocedure Evaluation (Addendum)
Anesthesia Evaluation  Patient identified by MRN, date of birth, ID band Patient awake    Reviewed: Allergy & Precautions, NPO status , Patient's Chart, lab work & pertinent test results  History of Anesthesia Complications (+) PONV and history of anesthetic complications  Airway Mallampati: III  TM Distance: >3 FB Neck ROM: Full    Dental no notable dental hx.    Pulmonary asthma , sleep apnea    Pulmonary exam normal        Cardiovascular negative cardio ROS Normal cardiovascular exam     Neuro/Psych  Headaches PSYCHIATRIC DISORDERS Anxiety Depression     Neuromuscular disease    GI/Hepatic Neg liver ROS,GERD  Medicated and Controlled,,  Endo/Other  Hypothyroidism    Renal/GU negative Renal ROS     Musculoskeletal  (+) Arthritis ,    Abdominal  (+) + obese  Peds  Hematology negative hematology ROS (+)   Anesthesia Other Findings osteoarthritis right knee  Reproductive/Obstetrics                             Anesthesia Physical Anesthesia Plan  ASA: 3  Anesthesia Plan: Spinal and Regional   Post-op Pain Management:    Induction: Intravenous  PONV Risk Score and Plan: 3 and Ondansetron, Dexamethasone, Propofol infusion, Midazolam and Treatment may vary due to age or medical condition  Airway Management Planned: Simple Face Mask  Additional Equipment:   Intra-op Plan:   Post-operative Plan:   Informed Consent: I have reviewed the patients History and Physical, chart, labs and discussed the procedure including the risks, benefits and alternatives for the proposed anesthesia with the patient or authorized representative who has indicated his/her understanding and acceptance.     Dental advisory given  Plan Discussed with: CRNA  Anesthesia Plan Comments:        Anesthesia Quick Evaluation

## 2022-06-22 NOTE — Anesthesia Procedure Notes (Signed)
Spinal  Patient location during procedure: OR Start time: 06/22/2022 11:20 AM End time: 06/22/2022 11:26 AM Reason for block: surgical anesthesia Staffing Performed: anesthesiologist  Anesthesiologist: Leonides Grills, MD Performed by: Leonides Grills, MD Authorized by: Leonides Grills, MD   Preanesthetic Checklist Completed: patient identified, IV checked, risks and benefits discussed, surgical consent, monitors and equipment checked, pre-op evaluation and timeout performed Spinal Block Patient position: sitting Prep: DuraPrep Patient monitoring: cardiac monitor, continuous pulse ox and blood pressure Approach: midline Location: L3-4 Injection technique: single-shot Needle Needle type: Pencan  Needle gauge: 24 G Needle length: 9 cm Assessment Sensory level: T10 Events: CSF return Additional Notes Functioning IV was confirmed and monitors were applied. Sterile prep and drape, including hand hygiene and sterile gloves were used. The patient was positioned and the spine was prepped. The skin was anesthetized with lidocaine.  Free flow of clear CSF was obtained prior to injecting local anesthetic into the CSF.  The spinal needle aspirated freely following injection.  The needle was carefully withdrawn.  The patient tolerated the procedure well.

## 2022-06-22 NOTE — Op Note (Signed)
Operative Note  Date of operation: 06/22/2022 Operative diagnosis: Right knee primary osteoarthritis Postoperative diagnosis: Same  Procedure: Right cemented total knee arthroplasty  Implants: Biomet/Zimmer persona knee system with size 6 standard right femur, size D right tibial tray, 14 mm medial congruent fixed-bearing right polythene insert, 32 mm patella button  Surgeon: Vanita Panda. Magnus Ivan, MD Assistant: Rexene Edison, PA-C  Anesthesia: #1 regional right lower extremity block, #2 spinal EBL: Less than 100 cc Tourniquet time: Less than 1 hour Antibiotics: 2 g IV Ancef Complications: None  Indications: The patient is a very pleasant 56 year old female with debilitating arthritis of her right knee and significant varus malalignment of that knee.  Her pain has been getting worsening for many years now.  She is morbidly obese but has lost significant weight with a BMI now down to 38.11.  At this point her right knee pain is daily and it is detrimentally affecting her mobility, her quality of life, and her activities day living to the point she wishes to proceed with a knee replacement on the right knee at this point we agree with this plan.  She understands that with her obesity that are still such a heightened risk of acute blood loss anemia, nerve or vessel injury, fracture, infection, DVT, implant failure and wound healing issues.  She understands her goals are hopefully decrease pain, improve mobility, and improve quality of life.  Procedure description: After informed consent was obtained and the appropriate right knee was marked, anesthesia obtained the right lower extremity block in the holding room and the patient was then brought to the operating room and set up on the operating table where spinal anesthesia was obtained.  She was then laid in supine position on the operating table and a Foley catheter was placed.  A Nahser turn was placed around her upper right thigh and her right  thigh, knee leg, leg, ankle and foot were prepped and draped with DuraPrep and sterile drapes and given a sterile stockinette.  Timeout was called and she identified as correct patient correct right knee.  We then used an Esmarch to wrap out the leg and the tourniquet was plated to 300 mm of pressure.  With the knee extended a direct midline incision was made over the patella and carried proximally distally.  Dissection was carried on the joint and medial parapatellar arthrotomy was made and there was a large joint effusion encountered.  We then flexed the knee and she was found to have significant varus malalignment with cartilage loss in all 3 compartments.  We remove remnants of the ACL medial and lateral meniscus as well as osteophytes in all 3 compartments.  We then made our proximal tibia cut correction varus and valgus at 3 degree slope making this cut for taking 2 mm off the low side.  We made the cut without difficulty and then used a intramedullary drill for our distal femoral cutting guide for a right knee at 5 degrees externally rotated for 10 mm distal femoral cut.  We made the cut without difficulty and brought the knee back down to full extension and clean more debris from the back of the knee.  We then placed a 10 mm disc tension block and we had achieved full extension.  Attention went back to the femur.  We placed a femoral sizing guide based off the epicondylar axis and chose a size 6 femur.  We put a 4-in-1 cutting block for size 6 femur and made her anterior and posterior  cuts followed by her chamfer cuts.  We then backed the tibia and chose a size right D tibia for coverage over the tibial plateau correcting for rotation of the tibial tubercle on the femur.  We then made our keel punch and drill hole off of this.  We then trialed our size D right tibia with our size 6 right standard CR femur.  We placed a 10 mm medial congruent polythene insert and went up to a 14 mm insert and asked along we  felt that the knee was stable after that.  We made then a patella cut for size 32 patella button.  With all trial instrumentation in the knee we put her through several cycles of motion and we are pleased with range of motion and stability.  We then removed all trial components from the knee and irrigate the knee and normal saline solution.  We dried the knee roll well and then with the knee in a flexed position mixed our cement and cemented our Biomet Zimmer persona tibial tray for right knee size D followed by our size 6 standard right CR femur.  We placed our 14 mm medial congruent polyethylene insert and cemented our size 32 patella button.  We then held the knee fully extended and compressed while the cement hardened.  Once the cement hardened with the tourniquet down hemostasis was obtained electrocautery.  The arthrotomy is closed with interrupted #1 Vicryl suture followed by 0 Vicryl to close the deep tissue and 2-0 Vicryl close subcutaneous tissue.  The skin was closed with staples.  Well-padded sterile dressing was applied.  The patient was taken off of the operating table and taken recovery room in stable addition with all final counts being correct and no complications noted.  Go-cart PA-C did assist in the entire case and his assistance was crucial and medically necessary especially with dealing with soft tissue retraction and management as well as guiding implant placement and a layered closure of the wound.

## 2022-06-22 NOTE — Transfer of Care (Signed)
Immediate Anesthesia Transfer of Care Note  Patient: Barbara Calderon  Procedure(s) Performed: RIGHT TOTAL KNEE ARTHROPLASTY (Right: Knee)  Patient Location: PACU  Anesthesia Type:Spinal  Level of Consciousness: awake, alert , and oriented  Airway & Oxygen Therapy: Patient Spontanous Breathing and Patient connected to face mask oxygen  Post-op Assessment: Report given to RN and Post -op Vital signs reviewed and stable  Post vital signs: Reviewed and stable  Last Vitals:  Vitals Value Taken Time  BP 94/57 06/22/22 1330  Temp    Pulse 58 06/22/22 1331  Resp 20 06/22/22 1331  SpO2 100 % 06/22/22 1331  Vitals shown include unvalidated device data.  Last Pain:  Vitals:   06/22/22 1025  TempSrc:   PainSc: 0-No pain         Complications: No notable events documented.

## 2022-06-22 NOTE — Interval H&P Note (Signed)
History and Physical Interval Note: The patient understands that she is here today for a right total knee replacement to treat her severe right knee arthritis.  There has been no acute or interval change in her medical status.  See H&P.  The risks and benefits of surgery have been explained in detail and informed consent is obtained.  The right operative knee has been marked.  06/22/2022 9:58 AM  Barbara Calderon  has presented today for surgery, with the diagnosis of osteoarthritis right knee.  The various methods of treatment have been discussed with the patient and family. After consideration of risks, benefits and other options for treatment, the patient has consented to  Procedure(s): RIGHT TOTAL KNEE ARTHROPLASTY (Right) as a surgical intervention.  The patient's history has been reviewed, patient examined, no change in status, stable for surgery.  I have reviewed the patient's chart and labs.  Questions were answered to the patient's satisfaction.     [name]

## 2022-06-22 NOTE — Anesthesia Postprocedure Evaluation (Signed)
Anesthesia Post Note  Patient: SHENNA BRISSETTE  Procedure(s) Performed: RIGHT TOTAL KNEE ARTHROPLASTY (Right: Knee)     Patient location during evaluation: PACU Anesthesia Type: Regional and Spinal Level of consciousness: awake Pain management: pain level controlled Vital Signs Assessment: post-procedure vital signs reviewed and stable Respiratory status: spontaneous breathing, nonlabored ventilation and respiratory function stable Cardiovascular status: blood pressure returned to baseline and stable Postop Assessment: no apparent nausea or vomiting Anesthetic complications: no   No notable events documented.  Last Vitals:  Vitals:   06/22/22 1500 06/22/22 1523  BP: 115/67 112/70  Pulse: (!) 54 (!) 57  Resp: 15 16  Temp:  36.6 C  SpO2: 100% 100%    Last Pain:  Vitals:   06/22/22 1523  TempSrc: Oral  PainSc:                  Catheryn Bacon Marlene Beidler

## 2022-06-22 NOTE — TOC Transition Note (Signed)
Transition of Care (TOC) - CM/SW Discharge Note   Patient Details  Name: Barbara Calderon MRN: [id number] Date of Birth: 05/17/1966  Transition of Care (TOC) CM/SW Contact:  HOYLE, LUCY, LCSW Phone Number: 06/22/2022, 3:51 PM   Clinical Narrative:     Met with pt today who confirms she does need a RW and no DME agency preferences.  Ordered placed with [employer] [facility] for delivery to room.  HHPT prearranged via MD office with [facility].  No further TOC needs.  Final next level of care: Home w Home Health Services Barriers to Discharge: No Barriers Identified   Patient Goals and CMS Choice Patient states their goals for this hospitalization and ongoing recovery are:: return home      Discharge Placement                       Discharge Plan and Services                DME Arranged: Walker rolling DME Agency: [facility] Date DME Agency Contacted: 06/22/22   Representative spoke with at DME Agency: [facility] Arranged: PT HH Agency: [facility]        Social Determinants of Health (SDOH) Interventions     Readmission Risk Interventions     No data to display             

## 2022-06-22 NOTE — Anesthesia Procedure Notes (Signed)
Anesthesia Regional Block: Adductor canal block   Pre-Anesthetic Checklist: , timeout performed,  Correct Patient, Correct Site, Correct Laterality,  Correct Procedure,, site marked,  Risks and benefits discussed,  Surgical consent,  Pre-op evaluation,  At surgeon's request and post-op pain management  Laterality: Right  Prep: chloraprep       Needles:  Injection technique: Single-shot  Needle Type: Echogenic Stimulator Needle     Needle Length: 10cm  Needle Gauge: 20     Additional Needles:   Procedures:,,,, ultrasound used (permanent image in chart),,    Narrative:  Start time: 06/22/2022 10:10 AM End time: 06/22/2022 10:20 AM Injection made incrementally with aspirations every 5 mL.  Performed by: Personally  Anesthesiologist: Leonides Grills, MD  Additional Notes: Functioning IV was confirmed and monitors were applied. A time-out was performed. Hand hygiene and sterile gloves were used. The thigh was placed in a frog-leg position and prepped in a sterile fashion. A 84mm 21ga Arrow echogenic stimulator needle was placed using ultrasound guidance.  Negative aspiration and negative test dose prior to incremental administration of local anesthetic. The patient tolerated the procedure well.

## 2022-06-23 ENCOUNTER — Other Ambulatory Visit: Payer: Self-pay

## 2022-06-23 DIAGNOSIS — M1711 Unilateral primary osteoarthritis, right knee: Secondary | ICD-10-CM | POA: Diagnosis not present

## 2022-06-23 DIAGNOSIS — Z91040 Latex allergy status: Secondary | ICD-10-CM | POA: Diagnosis not present

## 2022-06-23 DIAGNOSIS — E039 Hypothyroidism, unspecified: Secondary | ICD-10-CM | POA: Diagnosis not present

## 2022-06-23 DIAGNOSIS — R531 Weakness: Secondary | ICD-10-CM | POA: Diagnosis not present

## 2022-06-23 DIAGNOSIS — Z7982 Long term (current) use of aspirin: Secondary | ICD-10-CM | POA: Diagnosis not present

## 2022-06-23 DIAGNOSIS — Z79899 Other long term (current) drug therapy: Secondary | ICD-10-CM | POA: Diagnosis not present

## 2022-06-23 DIAGNOSIS — J45909 Unspecified asthma, uncomplicated: Secondary | ICD-10-CM | POA: Diagnosis not present

## 2022-06-23 LAB — BASIC METABOLIC PANEL
Anion gap: 10 (ref 5–15)
BUN: 15 mg/dL (ref 6–20)
CO2: 25 mmol/L (ref 22–32)
Calcium: 9.2 mg/dL (ref 8.9–10.3)
Chloride: 102 mmol/L (ref 98–111)
Creatinine, Ser: 0.71 mg/dL (ref 0.44–1.00)
GFR, Estimated: >60 mL/min (ref >60)
Glucose, Bld: 120 mg/dL — ABNORMAL HIGH (ref 70–99)
Potassium: 3.2 mmol/L — ABNORMAL LOW (ref 3.5–5.1)
Sodium: 137 mmol/L (ref 135–145)

## 2022-06-23 LAB — CBC
HCT: 40.3 % (ref 36.0–46.0)
Hemoglobin: 12.9 g/dL (ref 12.0–15.0)
MCH: 31.3 pg (ref 26.0–34.0)
MCHC: 32 g/dL (ref 30.0–36.0)
MCV: 97.8 fL (ref 80.0–100.0)
Platelets: 316 10*3/uL (ref 150–400)
RBC: 4.12 MIL/uL (ref 3.87–5.11)
RDW: 13.3 % (ref 11.5–15.5)
WBC: 13.3 10*3/uL — ABNORMAL HIGH (ref 4.0–10.5)
nRBC: 0 % (ref 0.0–0.2)

## 2022-06-23 MED ORDER — METHOCARBAMOL 500 MG PO TABS: 500.0000 mg | ORAL_TABLET | Freq: Four times a day (QID) | ORAL | 1 refills | Status: DC | PRN

## 2022-06-23 MED ORDER — ASPIRIN 81 MG PO CHEW: 81.0000 mg | CHEWABLE_TABLET | Freq: Two times a day (BID) | ORAL | 0 refills | Status: DC

## 2022-06-23 MED ORDER — OXYCODONE HCL 5 MG PO TABS: 5.0000 mg | ORAL_TABLET | Freq: Four times a day (QID) | ORAL | 0 refills | Status: DC | PRN

## 2022-06-23 NOTE — Discharge Instructions (Signed)

## 2022-06-23 NOTE — Progress Notes (Signed)
Physical Therapy Treatment Patient Details Name: Barbara Calderon MRN: [id number] DOB: 1966/01/16 Today's Date: 06/23/2022   History of Present Illness Pt is a 56yo female presenting s/p R-Tka on 06/22/22. PMH: GERD, HLD, lymphedema, fibromyalgia.    PT Comments    Progressing with mobility. Pt reports increased pain on today compared to yesterday. She was able to meet her PT goals on today. Encouraged pt to ambulate often at home, as tolerated, and to perform quad sets after her walks until HHPT comes out. All PT education completed.     Recommendations for follow up therapy are one component of a multi-disciplinary discharge planning process, led by the attending physician.  Recommendations may be updated based on patient status, additional functional criteria and insurance authorization.  Follow Up Recommendations  Follow physician's recommendations for discharge plan and follow up therapies     Assistance Recommended at Discharge Intermittent Supervision/Assistance  Patient can return home with the following A little help with walking and/or transfers;A little help with bathing/dressing/bathroom;Assistance with cooking/housework;Assist for transportation;Help with stairs or ramp for entrance   Equipment Recommendations  Rolling walker (2 wheels);BSC/3in1    Recommendations for Other Services       Precautions / Restrictions Precautions Precautions: Fall;Knee Precaution Comments: no pillow under the knee Restrictions Weight Bearing Restrictions: No Other Position/Activity Restrictions: wbat     Mobility  Bed Mobility               General bed mobility comments: oob in recliner    Transfers Overall transfer level: Needs assistance Equipment used: Rolling walker (2 wheels) Transfers: Sit to/from Stand Sit to Stand: Min guard           General transfer comment: Min gard for safety. Increased time. Cues provided.    Ambulation/Gait Ambulation/Gait  assistance: Min guard Gait Distance (Feet): 85 Feet Assistive device: Rolling walker (2 wheels) Gait Pattern/deviations: Decreased stride length, Decreased step length - right       General Gait Details: Min guard for safety. Slow but steady gait.   Stairs Stairs: Yes Stairs assistance: Min guard Stair Management: Forwards, Two rails, Step to pattern Number of Stairs: 2 General stair comments: up and over portable stairs x 1. cues for safety, technique, sequence.   Wheelchair Mobility    Modified Rankin (Stroke Patients Only)       Balance Overall balance assessment: Needs assistance         Standing balance support: Bilateral upper extremity supported, During functional activity, Reliant on assistive device for balance Standing balance-Leahy Scale: Poor                              Cognition Arousal/Alertness: Awake/alert Behavior During Therapy: WFL for tasks assessed/performed Overall Cognitive Status: Within Functional Limits for tasks assessed                                          Exercises Total Joint Exercises Quad Sets: AROM, Right, 10 reps Hip ABduction/ADduction: AROM, Right, 10 reps Straight Leg Raises: AROM, Right, 10 reps Goniometric ROM: ~10-60 degrees    General Comments        Pertinent Vitals/Pain Pain Assessment Pain Assessment: Faces Faces Pain Scale: Hurts even more Pain Location: right knee Pain Descriptors / Indicators: Aching, Tightness Pain Intervention(s): Monitored during session, Ice applied    Home Living  Prior Function            PT Goals (current goals can now be found in the care plan section) Progress towards PT goals: Progressing toward goals    Frequency    7X/week      PT Plan Current plan remains appropriate    Co-evaluation              AM-PAC PT "6 Clicks" Mobility   Outcome Measure  Help needed turning from your back to  your side while in a flat bed without using bedrails?: A Little Help needed moving from lying on your back to sitting on the side of a flat bed without using bedrails?: A Little Help needed moving to and from a bed to a chair (including a wheelchair)?: A Little Help needed standing up from a chair using your arms (e.g., wheelchair or bedside chair)?: A Little Help needed to walk in hospital room?: A Little Help needed climbing 3-5 steps with a railing? : A Little 6 Click Score: 18    End of Session Equipment Utilized During Treatment: Gait belt Activity Tolerance: Patient tolerated treatment well Patient left: in chair;with call bell/phone within reach   PT Visit Diagnosis: Pain;Difficulty in walking, not elsewhere classified (R26.2) Pain - Right/Left: Right Pain - part of body: Knee     Time: 7628-3151 PT Time Calculation (min) (ACUTE ONLY): 23 min  Charges:  $Gait Training: 8-22 mins $Therapeutic Exercise: 8-22 mins                     Faye Ramsay, PT Acute Rehabilitation  Office: [phone]     Rebeca Alert [facility] 06/23/2022, 11:34 AM

## 2022-06-23 NOTE — Discharge Summary (Signed)
Patient ID: Barbara Calderon MRN: [id number] DOB/AGE: 03-29-1966 56 y.o.  Admit date: 06/22/2022 Discharge date: 06/23/2022  Admission Diagnoses:  Principal Problem:   Primary osteoarthritis of right knee Active Problems:   Status post total right knee replacement   Discharge Diagnoses:  Same  Past Medical History:  Diagnosis Date   Abnormal mammogram    Abnormal Pap smear of cervix    Anxiety    Arthritis    Asthma    Depression    GERD (gastroesophageal reflux disease)    Hashimoto's disease    Heart murmur    Echo was good per pt.   Hyperlipidemia    borderline- no meds   Hypothyroidism    Lymphedema    Lymphedema    bilateral legs   Lymphedema    legs   Neuromuscular disorder (HCC)    fibro many years ago told this, neurpathy legs   Pneumonia    PONV (postoperative nausea and vomiting)    slow to wake up   Sleep apnea    mild no cpap   Thyroid disease    Hashimotos   Varicose veins     Surgeries: Procedure(s): RIGHT TOTAL KNEE ARTHROPLASTY on 06/22/2022   Consultants:   Discharged Condition: Improved  Hospital Course: Barbara Calderon is an 56 y.o. female who was admitted 06/22/2022 for operative treatment ofPrimary osteoarthritis of right knee. Patient has severe unremitting pain that affects sleep, daily activities, and work/hobbies. After pre-op clearance the patient was taken to the operating room on 06/22/2022 and underwent  Procedure(s): RIGHT TOTAL KNEE ARTHROPLASTY.    Patient was given perioperative antibiotics:  Anti-infectives (From admission, onward)    Start     Dose/Rate Route Frequency Ordered Stop   06/22/22 1800  ceFAZolin (ANCEF) IVPB 1 g/50 mL premix        1 g 100 mL/hr over 30 Minutes Intravenous Every 6 hours 06/22/22 1446 06/23/22 0012   06/22/22 0930  ceFAZolin (ANCEF) IVPB 2g/100 mL premix        2 g 200 mL/hr over 30 Minutes Intravenous On call to O.R. 06/22/22 0927 06/22/22 1133        Patient was given sequential  compression devices, early ambulation, and chemoprophylaxis to prevent DVT.  Patient benefited maximally from hospital stay and there were no complications.    Recent vital signs: Patient Vitals for the past 24 hrs:  BP Temp Temp src Pulse Resp SpO2 Height Weight  06/23/22 0545 107/61 98.1 F (36.7 C) Oral 64 17 97 % -- --  06/23/22 0130 96/63 97.8 F (36.6 C) Oral (!) 56 17 97 % -- --  06/22/22 2156 (!) 102/56 (!) 97.5 F (36.4 C) Oral 62 17 97 % -- --  06/22/22 1704 (!) 111/51 97.9 F (36.6 C) Oral 69 17 99 % -- --  06/22/22 1523 112/70 97.8 F (36.6 C) Oral (!) 57 16 100 % -- --  06/22/22 1500 115/67 -- -- (!) 54 15 100 % -- --  06/22/22 1445 113/61 -- -- (!) 56 19 99 % -- --  06/22/22 1430 117/63 98 F (36.7 C) -- (!) 53 17 97 % -- --  06/22/22 1415 105/69 -- -- (!) 54 (!) 22 96 % -- --  06/22/22 1400 108/75 -- -- (!) 54 17 99 % -- --  06/22/22 1345 111/74 -- -- (!) 55 17 100 % -- --  06/22/22 1330 (!) 94/57 97.8 F (36.6 C) -- (!) 57 19 100 % -- --  06/22/22 1025 109/60 -- -- 64 17 98 % -- --  06/22/22 1020 113/70 -- -- (!) 56 18 100 % -- --  06/22/22 1015 113/68 -- -- 61 13 100 % -- --  06/22/22 0958 -- -- -- -- -- -- 5\' 4"  (1.626 m) 100.7 kg  06/22/22 0934 109/73 98.2 F (36.8 C) Oral 62 16 100 % -- --     Recent laboratory studies: No results for input(s): "WBC", "HGB", "HCT", "PLT", "NA", "K", "CL", "CO2", "BUN", "CREATININE", "GLUCOSE", "INR", "CALCIUM" in the last 72 hours.  Invalid input(s): "PT", "2"   Discharge Medications:   Allergies as of 06/23/2022       Reactions   Hydrocodone-acetaminophen Hives, Itching   Latex Rash        Medication List     STOP taking these medications    aspirin EC 81 MG tablet Replaced by: aspirin 81 MG chewable tablet       TAKE these medications    albuterol 108 (90 Base) MCG/ACT inhaler Commonly known as: VENTOLIN HFA USE 2 INHALATIONS BY MOUTH  EVERY 4 HOURS AS NEEDED FOR WHEEZING   AMBULATORY NON  FORMULARY MEDICATION Knee-high, medium compression, graduated compression stockings. Apply to lower extremities. [email or web address], Zippered Compression Stockings, medium circ, long length   aspirin 81 MG chewable tablet Chew 1 tablet (81 mg total) by mouth 2 (two) times daily. Replaces: aspirin EC 81 MG tablet   citalopram 40 MG tablet Commonly known as: CELEXA TAKE 1 TABLET BY MOUTH DAILY   diclofenac Sodium 1 % Gel Commonly known as: VOLTAREN Apply 1 Application topically 2 (two) times daily as needed (knee/ankle pain).   furosemide 20 MG tablet Commonly known as: LASIX Take 1 tablet by mouth daily as needed for fluid or edema.   gabapentin 100 MG capsule Commonly known as: NEURONTIN Take 1 capsule (100 mg total) by mouth daily. What changed: additional instructions   gabapentin 300 MG capsule Commonly known as: NEURONTIN TAKE 1 CAPSULE BY MOUTH IN  THE EVENING What changed: See the new instructions.   lidocaine 5 % Commonly known as: LIDODERM Place 1 patch onto the skin daily. Remove & Discard patch within 12 hours or as directed by MD   meloxicam 15 MG tablet Commonly known as: MOBIC One tab PO every 24 hours with a meal for 2 weeks, then once every 24 hours prn pain. What changed:  how much to take how to take this when to take this additional instructions   methocarbamol 500 MG tablet Commonly known as: ROBAXIN Take 1 tablet (500 mg total) by mouth every 6 (six) hours as needed for muscle spasms.   oxybutynin 10 MG 24 hr tablet Commonly known as: Ditropan XL Take 1 tablet (10 mg total) by mouth at bedtime.   oxyCODONE 5 MG immediate release tablet Commonly known as: Oxy IR/ROXICODONE Take 1-2 tablets (5-10 mg total) by mouth every 6 (six) hours as needed for moderate pain (pain score 4-6). No more than 6 tablets per day   pantoprazole 40 MG tablet Commonly known as: PROTONIX TAKE 1 TABLET BY MOUTH DAILY   phentermine 15 MG capsule Take 1 capsule  (15 mg total) by mouth every morning.   potassium chloride SA 20 MEQ tablet Commonly known as: KLOR-CON M Take 1 tablet by mouth daily as needed (ONLY ON THE DAYS OF TAKING LASIX).   Synthroid 125 MCG tablet Generic drug: levothyroxine TAKE 1 TABLET BY MOUTH DAILY  BEFORE BREAKFAST   traMADol 50  MG tablet Commonly known as: ULTRAM Take 1 tablet (50 mg total) by mouth every 6 (six) hours as needed.               Durable Medical Equipment  (From admission, onward)           Start     Ordered   06/22/22 1529  DME 3 n 1  Once        06/22/22 1528   06/22/22 1529  DME Walker rolling  Once       Question Answer Comment  Walker: With 5 Inch Wheels   Patient needs a walker to treat with the following condition Status post total right knee replacement      06/22/22 1528            Diagnostic Studies: DG Knee Right Port  Result Date: 06/22/2022 CLINICAL DATA:  Status post total knee replacement EXAM: PORTABLE RIGHT KNEE - 1-2 VIEW COMPARISON:  Knee radiograph 08/21/2019 FINDINGS: Postsurgical changes of 3 component total knee arthroplasty. Intact hardware without evidence of loosening or fracture. Expected soft tissue changes including a joint effusion. IMPRESSION: Postsurgical changes of right total knee arthroplasty. No evidence of immediate hardware complication. Electronically Signed   By: Caprice Renshaw M.D.   On: 06/22/2022 14:29    Disposition: Discharge disposition: 01-Home or Self Care          Follow-up Information     [facility] Follow up.   Specialty: Home Health Services Why: to provide home physical therapy visits Contact information: [address] [place] [address] [phone]         [name], MD Follow up in 2 week(s).   Specialty: Orthopedic Surgery Contact information: [address] [place] [address] [phone]                  Signed: [name] 06/23/2022,  8:44 AM

## 2022-06-23 NOTE — Progress Notes (Signed)
Patient has a walker for home delivered to her room. Patient needs a 3-in-1 (bedside commode) for home. Patient just had right knee surgery. The 3-in-1 (bedside commode) will help her pain be decreased when activities of daily living are being performed as well as help aid in her recovery from surgery. Patient is requesting a 3-in-1 (bedside commode) for home.

## 2022-06-23 NOTE — Plan of Care (Signed)
  Problem: Education: Goal: Knowledge of General Education information will improve Description Including pain rating scale, medication(s)/side effects and non-pharmacologic comfort measures Outcome: Progressing   

## 2022-06-23 NOTE — Progress Notes (Signed)
The patient is alert and oriented and has been seen by her physician. The orders for discharge were written. IV has been removed. Went over discharge instructions with patient and family. She is being discharged via wheelchair with all of her belongings.  

## 2022-06-23 NOTE — Progress Notes (Signed)
Subjective: 1 Day Post-Op Procedure(s) (LRB): RIGHT TOTAL KNEE ARTHROPLASTY (Right) Patient reports pain as moderate.    Objective: Vital signs in last 24 hours: Temp:  [97.5 F (36.4 C)-98.2 F (36.8 C)] 98.1 F (36.7 C) (12/16 0545) Pulse Rate:  [53-69] 64 (12/16 0545) Resp:  [13-22] 17 (12/16 0545) BP: (94-117)/(51-75) 107/61 (12/16 0545) SpO2:  [96 %-100 %] 97 % (12/16 0545) Weight:  [100.7 kg] 100.7 kg (12/15 0958)  Intake/Output from previous day: 12/15 0701 - 12/16 0700 In: 2873.2 [P.O.:480; I.V.:2093.2; IV Piggyback:300] Out: 2375 [Urine:2300; Blood:75] Intake/Output this shift: No intake/output data recorded.  No results for input(s): "HGB" in the last 72 hours. No results for input(s): "WBC", "RBC", "HCT", "PLT" in the last 72 hours. No results for input(s): "NA", "K", "CL", "CO2", "BUN", "CREATININE", "GLUCOSE", "CALCIUM" in the last 72 hours. No results for input(s): "LABPT", "INR" in the last 72 hours.  Sensation intact distally Intact pulses distally Dorsiflexion/Plantar flexion intact Incision: dressing C/D/I No cellulitis present Compartment soft   Assessment/Plan: 1 Day Post-Op Procedure(s) (LRB): RIGHT TOTAL KNEE ARTHROPLASTY (Right) Up with therapy Discharge home with home health      Barbara Calderon 06/23/2022, 8:42 AM

## 2022-06-24 DIAGNOSIS — Z7982 Long term (current) use of aspirin: Secondary | ICD-10-CM | POA: Diagnosis not present

## 2022-06-24 DIAGNOSIS — Z96651 Presence of right artificial knee joint: Secondary | ICD-10-CM | POA: Diagnosis not present

## 2022-06-24 DIAGNOSIS — M19072 Primary osteoarthritis, left ankle and foot: Secondary | ICD-10-CM | POA: Diagnosis not present

## 2022-06-24 DIAGNOSIS — Z471 Aftercare following joint replacement surgery: Secondary | ICD-10-CM | POA: Diagnosis not present

## 2022-06-24 DIAGNOSIS — J45909 Unspecified asthma, uncomplicated: Secondary | ICD-10-CM | POA: Diagnosis not present

## 2022-06-24 DIAGNOSIS — E785 Hyperlipidemia, unspecified: Secondary | ICD-10-CM | POA: Diagnosis not present

## 2022-06-24 DIAGNOSIS — I890 Lymphedema, not elsewhere classified: Secondary | ICD-10-CM | POA: Diagnosis not present

## 2022-06-24 DIAGNOSIS — Z9181 History of falling: Secondary | ICD-10-CM | POA: Diagnosis not present

## 2022-06-24 DIAGNOSIS — K219 Gastro-esophageal reflux disease without esophagitis: Secondary | ICD-10-CM | POA: Diagnosis not present

## 2022-06-24 DIAGNOSIS — G44211 Episodic tension-type headache, intractable: Secondary | ICD-10-CM | POA: Diagnosis not present

## 2022-06-24 DIAGNOSIS — G709 Myoneural disorder, unspecified: Secondary | ICD-10-CM | POA: Diagnosis not present

## 2022-06-24 DIAGNOSIS — G4730 Sleep apnea, unspecified: Secondary | ICD-10-CM | POA: Diagnosis not present

## 2022-06-24 DIAGNOSIS — N810 Urethrocele: Secondary | ICD-10-CM | POA: Diagnosis not present

## 2022-06-24 DIAGNOSIS — I872 Venous insufficiency (chronic) (peripheral): Secondary | ICD-10-CM | POA: Diagnosis not present

## 2022-06-24 DIAGNOSIS — Z791 Long term (current) use of non-steroidal anti-inflammatories (NSAID): Secondary | ICD-10-CM | POA: Diagnosis not present

## 2022-06-24 DIAGNOSIS — E039 Hypothyroidism, unspecified: Secondary | ICD-10-CM | POA: Diagnosis not present

## 2022-06-24 DIAGNOSIS — F32A Depression, unspecified: Secondary | ICD-10-CM | POA: Diagnosis not present

## 2022-06-25 ENCOUNTER — Encounter (HOSPITAL_COMMUNITY): Payer: Self-pay | Admitting: Orthopaedic Surgery

## 2022-06-26 ENCOUNTER — Telehealth: Payer: Self-pay | Admitting: *Deleted

## 2022-06-26 NOTE — Telephone Encounter (Signed)
Ortho bundle D/C call completed. 

## 2022-06-27 ENCOUNTER — Other Ambulatory Visit: Payer: Self-pay | Admitting: Physician Assistant

## 2022-06-27 ENCOUNTER — Telehealth: Payer: Self-pay | Admitting: *Deleted

## 2022-06-27 DIAGNOSIS — E039 Hypothyroidism, unspecified: Secondary | ICD-10-CM | POA: Diagnosis not present

## 2022-06-27 DIAGNOSIS — M19072 Primary osteoarthritis, left ankle and foot: Secondary | ICD-10-CM | POA: Diagnosis not present

## 2022-06-27 DIAGNOSIS — F32A Depression, unspecified: Secondary | ICD-10-CM | POA: Diagnosis not present

## 2022-06-27 DIAGNOSIS — Z96651 Presence of right artificial knee joint: Secondary | ICD-10-CM | POA: Diagnosis not present

## 2022-06-27 DIAGNOSIS — Z471 Aftercare following joint replacement surgery: Secondary | ICD-10-CM | POA: Diagnosis not present

## 2022-06-27 DIAGNOSIS — G44211 Episodic tension-type headache, intractable: Secondary | ICD-10-CM | POA: Diagnosis not present

## 2022-06-27 DIAGNOSIS — E785 Hyperlipidemia, unspecified: Secondary | ICD-10-CM | POA: Diagnosis not present

## 2022-06-27 DIAGNOSIS — G4730 Sleep apnea, unspecified: Secondary | ICD-10-CM | POA: Diagnosis not present

## 2022-06-27 DIAGNOSIS — I872 Venous insufficiency (chronic) (peripheral): Secondary | ICD-10-CM | POA: Diagnosis not present

## 2022-06-27 DIAGNOSIS — G709 Myoneural disorder, unspecified: Secondary | ICD-10-CM | POA: Diagnosis not present

## 2022-06-27 DIAGNOSIS — Z7982 Long term (current) use of aspirin: Secondary | ICD-10-CM | POA: Diagnosis not present

## 2022-06-27 DIAGNOSIS — I890 Lymphedema, not elsewhere classified: Secondary | ICD-10-CM | POA: Diagnosis not present

## 2022-06-27 DIAGNOSIS — Z9181 History of falling: Secondary | ICD-10-CM | POA: Diagnosis not present

## 2022-06-27 DIAGNOSIS — K219 Gastro-esophageal reflux disease without esophagitis: Secondary | ICD-10-CM | POA: Diagnosis not present

## 2022-06-27 DIAGNOSIS — Z791 Long term (current) use of non-steroidal anti-inflammatories (NSAID): Secondary | ICD-10-CM | POA: Diagnosis not present

## 2022-06-27 DIAGNOSIS — N810 Urethrocele: Secondary | ICD-10-CM | POA: Diagnosis not present

## 2022-06-27 DIAGNOSIS — J45909 Unspecified asthma, uncomplicated: Secondary | ICD-10-CM | POA: Diagnosis not present

## 2022-06-27 MED ORDER — TRAMADOL HCL 50 MG PO TABS: 50.0000 mg | ORAL_TABLET | Freq: Four times a day (QID) | ORAL | 0 refills | Status: DC | PRN

## 2022-06-27 NOTE — Telephone Encounter (Signed)
Patient called and would like to try Tramadol instead of the Oxycodone as it is causing her some side effects. She has taken Tramadol in the past and works well for her. Pharmacy [facility] on chart. Thanks.

## 2022-06-29 ENCOUNTER — Telehealth: Payer: Self-pay | Admitting: *Deleted

## 2022-06-29 DIAGNOSIS — Z9181 History of falling: Secondary | ICD-10-CM | POA: Diagnosis not present

## 2022-06-29 DIAGNOSIS — Z791 Long term (current) use of non-steroidal anti-inflammatories (NSAID): Secondary | ICD-10-CM | POA: Diagnosis not present

## 2022-06-29 DIAGNOSIS — K219 Gastro-esophageal reflux disease without esophagitis: Secondary | ICD-10-CM | POA: Diagnosis not present

## 2022-06-29 DIAGNOSIS — I872 Venous insufficiency (chronic) (peripheral): Secondary | ICD-10-CM | POA: Diagnosis not present

## 2022-06-29 DIAGNOSIS — G44211 Episodic tension-type headache, intractable: Secondary | ICD-10-CM | POA: Diagnosis not present

## 2022-06-29 DIAGNOSIS — I890 Lymphedema, not elsewhere classified: Secondary | ICD-10-CM | POA: Diagnosis not present

## 2022-06-29 DIAGNOSIS — N810 Urethrocele: Secondary | ICD-10-CM | POA: Diagnosis not present

## 2022-06-29 DIAGNOSIS — G709 Myoneural disorder, unspecified: Secondary | ICD-10-CM | POA: Diagnosis not present

## 2022-06-29 DIAGNOSIS — Z471 Aftercare following joint replacement surgery: Secondary | ICD-10-CM | POA: Diagnosis not present

## 2022-06-29 DIAGNOSIS — E785 Hyperlipidemia, unspecified: Secondary | ICD-10-CM | POA: Diagnosis not present

## 2022-06-29 DIAGNOSIS — Z7982 Long term (current) use of aspirin: Secondary | ICD-10-CM | POA: Diagnosis not present

## 2022-06-29 DIAGNOSIS — J45909 Unspecified asthma, uncomplicated: Secondary | ICD-10-CM | POA: Diagnosis not present

## 2022-06-29 DIAGNOSIS — F32A Depression, unspecified: Secondary | ICD-10-CM | POA: Diagnosis not present

## 2022-06-29 DIAGNOSIS — E039 Hypothyroidism, unspecified: Secondary | ICD-10-CM | POA: Diagnosis not present

## 2022-06-29 DIAGNOSIS — M19072 Primary osteoarthritis, left ankle and foot: Secondary | ICD-10-CM | POA: Diagnosis not present

## 2022-06-29 DIAGNOSIS — Z96651 Presence of right artificial knee joint: Secondary | ICD-10-CM | POA: Diagnosis not present

## 2022-06-29 DIAGNOSIS — G4730 Sleep apnea, unspecified: Secondary | ICD-10-CM | POA: Diagnosis not present

## 2022-06-29 NOTE — Telephone Encounter (Signed)
Ortho bundle 7 day call completed. 

## 2022-06-30 DIAGNOSIS — Z96651 Presence of right artificial knee joint: Secondary | ICD-10-CM | POA: Diagnosis not present

## 2022-06-30 DIAGNOSIS — J45909 Unspecified asthma, uncomplicated: Secondary | ICD-10-CM | POA: Diagnosis not present

## 2022-06-30 DIAGNOSIS — I890 Lymphedema, not elsewhere classified: Secondary | ICD-10-CM | POA: Diagnosis not present

## 2022-06-30 DIAGNOSIS — E785 Hyperlipidemia, unspecified: Secondary | ICD-10-CM | POA: Diagnosis not present

## 2022-06-30 DIAGNOSIS — I872 Venous insufficiency (chronic) (peripheral): Secondary | ICD-10-CM | POA: Diagnosis not present

## 2022-06-30 DIAGNOSIS — G44211 Episodic tension-type headache, intractable: Secondary | ICD-10-CM | POA: Diagnosis not present

## 2022-06-30 DIAGNOSIS — G4730 Sleep apnea, unspecified: Secondary | ICD-10-CM | POA: Diagnosis not present

## 2022-06-30 DIAGNOSIS — Z471 Aftercare following joint replacement surgery: Secondary | ICD-10-CM | POA: Diagnosis not present

## 2022-06-30 DIAGNOSIS — K219 Gastro-esophageal reflux disease without esophagitis: Secondary | ICD-10-CM | POA: Diagnosis not present

## 2022-06-30 DIAGNOSIS — Z7982 Long term (current) use of aspirin: Secondary | ICD-10-CM | POA: Diagnosis not present

## 2022-06-30 DIAGNOSIS — G709 Myoneural disorder, unspecified: Secondary | ICD-10-CM | POA: Diagnosis not present

## 2022-06-30 DIAGNOSIS — M19072 Primary osteoarthritis, left ankle and foot: Secondary | ICD-10-CM | POA: Diagnosis not present

## 2022-06-30 DIAGNOSIS — E039 Hypothyroidism, unspecified: Secondary | ICD-10-CM | POA: Diagnosis not present

## 2022-06-30 DIAGNOSIS — Z9181 History of falling: Secondary | ICD-10-CM | POA: Diagnosis not present

## 2022-06-30 DIAGNOSIS — F32A Depression, unspecified: Secondary | ICD-10-CM | POA: Diagnosis not present

## 2022-06-30 DIAGNOSIS — Z791 Long term (current) use of non-steroidal anti-inflammatories (NSAID): Secondary | ICD-10-CM | POA: Diagnosis not present

## 2022-06-30 DIAGNOSIS — N810 Urethrocele: Secondary | ICD-10-CM | POA: Diagnosis not present

## 2022-07-03 ENCOUNTER — Encounter (INDEPENDENT_AMBULATORY_CARE_PROVIDER_SITE_OTHER): Payer: Self-pay

## 2022-07-03 DIAGNOSIS — K219 Gastro-esophageal reflux disease without esophagitis: Secondary | ICD-10-CM | POA: Diagnosis not present

## 2022-07-03 DIAGNOSIS — Z9181 History of falling: Secondary | ICD-10-CM | POA: Diagnosis not present

## 2022-07-03 DIAGNOSIS — I890 Lymphedema, not elsewhere classified: Secondary | ICD-10-CM | POA: Diagnosis not present

## 2022-07-03 DIAGNOSIS — J45909 Unspecified asthma, uncomplicated: Secondary | ICD-10-CM | POA: Diagnosis not present

## 2022-07-03 DIAGNOSIS — G44211 Episodic tension-type headache, intractable: Secondary | ICD-10-CM | POA: Diagnosis not present

## 2022-07-03 DIAGNOSIS — I872 Venous insufficiency (chronic) (peripheral): Secondary | ICD-10-CM | POA: Diagnosis not present

## 2022-07-03 DIAGNOSIS — Z96651 Presence of right artificial knee joint: Secondary | ICD-10-CM | POA: Diagnosis not present

## 2022-07-03 DIAGNOSIS — Z791 Long term (current) use of non-steroidal anti-inflammatories (NSAID): Secondary | ICD-10-CM | POA: Diagnosis not present

## 2022-07-03 DIAGNOSIS — G709 Myoneural disorder, unspecified: Secondary | ICD-10-CM | POA: Diagnosis not present

## 2022-07-03 DIAGNOSIS — F32A Depression, unspecified: Secondary | ICD-10-CM | POA: Diagnosis not present

## 2022-07-03 DIAGNOSIS — Z7982 Long term (current) use of aspirin: Secondary | ICD-10-CM | POA: Diagnosis not present

## 2022-07-03 DIAGNOSIS — E785 Hyperlipidemia, unspecified: Secondary | ICD-10-CM | POA: Diagnosis not present

## 2022-07-03 DIAGNOSIS — G4730 Sleep apnea, unspecified: Secondary | ICD-10-CM | POA: Diagnosis not present

## 2022-07-03 DIAGNOSIS — E039 Hypothyroidism, unspecified: Secondary | ICD-10-CM | POA: Diagnosis not present

## 2022-07-03 DIAGNOSIS — N810 Urethrocele: Secondary | ICD-10-CM | POA: Diagnosis not present

## 2022-07-03 DIAGNOSIS — M19072 Primary osteoarthritis, left ankle and foot: Secondary | ICD-10-CM | POA: Diagnosis not present

## 2022-07-03 DIAGNOSIS — Z471 Aftercare following joint replacement surgery: Secondary | ICD-10-CM | POA: Diagnosis not present

## 2022-07-04 DIAGNOSIS — K219 Gastro-esophageal reflux disease without esophagitis: Secondary | ICD-10-CM | POA: Diagnosis not present

## 2022-07-04 DIAGNOSIS — M19072 Primary osteoarthritis, left ankle and foot: Secondary | ICD-10-CM | POA: Diagnosis not present

## 2022-07-04 DIAGNOSIS — I872 Venous insufficiency (chronic) (peripheral): Secondary | ICD-10-CM | POA: Diagnosis not present

## 2022-07-04 DIAGNOSIS — G44211 Episodic tension-type headache, intractable: Secondary | ICD-10-CM | POA: Diagnosis not present

## 2022-07-04 DIAGNOSIS — I890 Lymphedema, not elsewhere classified: Secondary | ICD-10-CM | POA: Diagnosis not present

## 2022-07-04 DIAGNOSIS — Z7982 Long term (current) use of aspirin: Secondary | ICD-10-CM | POA: Diagnosis not present

## 2022-07-04 DIAGNOSIS — Z96651 Presence of right artificial knee joint: Secondary | ICD-10-CM | POA: Diagnosis not present

## 2022-07-04 DIAGNOSIS — J45909 Unspecified asthma, uncomplicated: Secondary | ICD-10-CM | POA: Diagnosis not present

## 2022-07-04 DIAGNOSIS — E785 Hyperlipidemia, unspecified: Secondary | ICD-10-CM | POA: Diagnosis not present

## 2022-07-04 DIAGNOSIS — G709 Myoneural disorder, unspecified: Secondary | ICD-10-CM | POA: Diagnosis not present

## 2022-07-04 DIAGNOSIS — F32A Depression, unspecified: Secondary | ICD-10-CM | POA: Diagnosis not present

## 2022-07-04 DIAGNOSIS — Z471 Aftercare following joint replacement surgery: Secondary | ICD-10-CM | POA: Diagnosis not present

## 2022-07-04 DIAGNOSIS — G4730 Sleep apnea, unspecified: Secondary | ICD-10-CM | POA: Diagnosis not present

## 2022-07-04 DIAGNOSIS — Z791 Long term (current) use of non-steroidal anti-inflammatories (NSAID): Secondary | ICD-10-CM | POA: Diagnosis not present

## 2022-07-04 DIAGNOSIS — N810 Urethrocele: Secondary | ICD-10-CM | POA: Diagnosis not present

## 2022-07-04 DIAGNOSIS — Z9181 History of falling: Secondary | ICD-10-CM | POA: Diagnosis not present

## 2022-07-04 DIAGNOSIS — E039 Hypothyroidism, unspecified: Secondary | ICD-10-CM | POA: Diagnosis not present

## 2022-07-05 ENCOUNTER — Telehealth: Payer: Self-pay | Admitting: *Deleted

## 2022-07-05 ENCOUNTER — Ambulatory Visit (INDEPENDENT_AMBULATORY_CARE_PROVIDER_SITE_OTHER): Payer: Medicare Other | Admitting: Orthopaedic Surgery

## 2022-07-05 ENCOUNTER — Encounter: Payer: Self-pay | Admitting: Orthopaedic Surgery

## 2022-07-05 DIAGNOSIS — Z96651 Presence of right artificial knee joint: Secondary | ICD-10-CM

## 2022-07-05 NOTE — Progress Notes (Signed)
The patient is here for first postoperative visit status post a right total knee arthroplasty.  She reports good progress overall.  She is ready to transition outpatient physical therapy which she does have scheduled for tomorrow.  She does have lymphedema in her legs and it is a little bit worse on this right operative side.  On exam her calf is soft.  The swelling is to be expected.  Her extension is almost full and her flexion is at least 90 degrees.  Overall she looks good.  The staples are removed and Steri-Strips applied to her incision.  She will transition to outpatient therapy.  We will see her back in 4 weeks to see how she is doing overall from a mobility standpoint.

## 2022-07-05 NOTE — Telephone Encounter (Signed)
Ortho bundle 14 day in office visit completed. 

## 2022-07-06 ENCOUNTER — Encounter: Payer: Self-pay | Admitting: Rehabilitative and Restorative Service Providers"

## 2022-07-06 ENCOUNTER — Ambulatory Visit: Payer: Medicare Other | Attending: Orthopaedic Surgery | Admitting: Rehabilitative and Restorative Service Providers"

## 2022-07-06 DIAGNOSIS — M1711 Unilateral primary osteoarthritis, right knee: Secondary | ICD-10-CM | POA: Diagnosis not present

## 2022-07-06 DIAGNOSIS — M25561 Pain in right knee: Secondary | ICD-10-CM | POA: Diagnosis not present

## 2022-07-06 DIAGNOSIS — R2689 Other abnormalities of gait and mobility: Secondary | ICD-10-CM | POA: Diagnosis not present

## 2022-07-06 DIAGNOSIS — M6281 Muscle weakness (generalized): Secondary | ICD-10-CM | POA: Diagnosis not present

## 2022-07-06 DIAGNOSIS — R600 Localized edema: Secondary | ICD-10-CM | POA: Diagnosis not present

## 2022-07-06 NOTE — Addendum Note (Signed)
Addended by: Margretta Ditty M on: 07/06/2022 12:53 PM   Modules accepted: Orders

## 2022-07-06 NOTE — Therapy (Signed)
OUTPATIENT PHYSICAL THERAPY LOWER EXTREMITY EVALUATION   Patient Name: Barbara Calderon MRN: [id number] DOB:07/04/1966, 56 y.o., female Today's Date: 07/06/2022  END OF SESSION:  PT End of Session - 07/06/22 1015     Visit Number 1    Number of Visits 16    Date for PT Re-Evaluation 09/04/22    Authorization Type [employer]    PT Start Time 1017    PT Stop Time 1056    PT Time Calculation (min) 39 min    Activity Tolerance Patient tolerated treatment well    Behavior During Therapy WFL for tasks assessed/performed             Past Medical History:  Diagnosis Date   Abnormal mammogram    Abnormal Pap smear of cervix    Anxiety    Arthritis    Asthma    Depression    GERD (gastroesophageal reflux disease)    Hashimoto's disease    Heart murmur    Echo was good per pt.   Hyperlipidemia    borderline- no meds   Hypothyroidism    Lymphedema    Lymphedema    bilateral legs   Lymphedema    legs   Neuromuscular disorder (HCC)    fibro many years ago told this, neurpathy legs   Pneumonia    PONV (postoperative nausea and vomiting)    slow to wake up   Sleep apnea    mild no cpap   Thyroid disease    Hashimotos   Varicose veins    Past Surgical History:  Procedure Laterality Date   ABDOMINAL HYSTERECTOMY     partial   APPLICATION OF WOUND VAC     on left lower leg above foot   BLADDER SUSPENSION     CESAREAN SECTION     x2   CHOLECYSTECTOMY OPEN     INCISION AND DRAINAGE OF WOUND     PARTIAL HYSTERECTOMY     SKIN GRAFT     TOTAL KNEE ARTHROPLASTY Right 06/22/2022   Procedure: RIGHT TOTAL KNEE ARTHROPLASTY;  Surgeon: [name], MD;  Location: [facility];  Service: Orthopedics;  Laterality: Right;   TUBAL LIGATION     Patient Active Problem List   Diagnosis Date Noted   Status post total right knee replacement 06/22/2022   Preoperative clearance 04/11/2022   Primary osteoarthritis of left ankle 12/12/2021   Abnormal finding on MRI of brain  08/03/2021   Chronic intractable headache 08/03/2021   Mild sleep apnea 04/15/2019   Chronic venous insufficiency 11/12/2014   Family history of DVT 11/12/2014   Lymphedema 11/12/2014   Vaginal vault prolapse 11/06/2013   SUI (stress urinary incontinence, female) 08/03/2013   Obesity, morbid, BMI 40.0-49.9 (HCC) 08/03/2013   Cystocele 08/03/2013   Asthma 09/20/2011   Depression 08/22/2011   History of gastroesophageal reflux (GERD) 08/22/2011   SHINGLES 08/08/2007   Hypothyroidism 05/22/2007   POLYARTHRALGIA 05/22/2007   LEG EDEMA 05/22/2007   URINARY FREQUENCY 05/22/2007    PCP: Christen Butter, NP  REFERRING PROVIDER: Doneen Poisson, MD  REFERRING DIAG: s/p R TKR  THERAPY DIAG:  Muscle weakness (generalized)  Acute pain of right knee  Other abnormalities of gait and mobility  Localized edema  Rationale for Evaluation and Treatment: Rehabilitation  ONSET DATE: 06/22/2022  SUBJECTIVE:   SUBJECTIVE STATEMENT: The patient is s/p R TKR on 06/22/22 and went home with [facility] PT that ended this week. She is doing HEP of SLR, LAQ, marching, heel raises, supine  hip abduction, standing SLRs. She reports she did not make it to 90 with HH PT (in the 80s) for flexion. She is walking with a RW mod indep in the home. Prior to her surgery, she was walking with SPC and RW due to knee pain.   PERTINENT HISTORY: GERD, HLD, lymphedema, fibromyalgia, h/o imbalance (vision, hearing changes)-- MS ruled out. H/o migraines. PAIN:  Are you having pain? Yes: NPRS scale: 2/10 Pain location: R knee Pain description: much improved since eval Aggravating factors: ROM in therapy Relieving factors: meds  PRECAUTIONS: None  WEIGHT BEARING RESTRICTIONS: No  FALLS:  Has patient fallen in last 6 months? Yes. Number of falls estimates 6  LIVING ENVIRONMENT: Lives with: lives with their family *She is working to adopt a 57 year old and is primary caregiver for 57year old. Lives in:  House/apartment Stairs: Yes: External: 4 steps; has rail Has following equipment at home: Dan Humphreys - 2 wheeled; used a cane in the past  OCCUPATION: retired Arts administrator; on disability due to problems with lymphedema  PLOF: Independent  PATIENT GOALS: Improve mobility and stability. "I want to be able to walk."  NEXT MD VISIT:  in 4 weeks  OBJECTIVE:   PATIENT SURVEYS:  FOTO 52% (goal to 77%)  COGNITION: Overall cognitive status: Within functional limits for tasks assessed     SENSATION: Some h/o abnormal sensation in her L toes (may be from prior meds)  EDEMA:  Has a h/o lymphedema Circumferential measurements: L 19.5" and R 21.0" Incision is well approximated and clean/dry with steri-strips intact  POSTURE: Maintains mild R knee flexion in standing  PALPATION: Tenderness over the lateral distal aspect of the knee  LOWER EXTREMITY ROM:  AROM Right eval Left eval  Knee flexion 85   Knee extension -18                     PROM    Knee flexion 92   Knee extension -12    LOWER EXTREMITY MMT: >3/5 throughout LEs   FUNCTIONAL TESTS:  5 times sit to stand: 22.18 seconds without UEs  Gait speed=20 ft/8.08 seconds=2.48 ft/sec  GAIT: Distance walked: 100 Assistive device utilized: Environmental consultant - 2 wheeled Level of assistance: Modified independence Comments: R knee maintains mild flexion with foot flat Stairs: mod indep step to pattern with bilat handrails x 4 steps  TODAY'S TREATMENT:   07/06/22  SLR x 10 Quad set x 10 with towel under ankle Sit<>stand x 5 reps Supine heel slide x 5 Sidelying hip abduction x 10  PATIENT EDUCATION:  Education details: HEP Person educated: Patient Education method: Programmer, multimedia, Facilities manager, and Handouts Education comprehension: verbalized understanding and returned demonstration  HOME EXERCISE PROGRAM: Access Code: [email or web address] URL: [email or web address] Date: 07/06/2022 Prepared by: Margretta Ditty  Exercises - Quad Setting and Stretching  - 2 x daily - 7 x weekly - 1 sets - 10 reps - Supine Active Straight Leg Raise  - 1 x daily - 7 x weekly - 3 sets - 15 reps - Supine Heel Slides  - 1 x daily - 7 x weekly - 3 sets - 15 reps - Sidelying Hip Abduction  - 2 x daily - 7 x weekly - 1 sets - 10 reps - Seated Long Arc Quad  - 1 x daily - 7 x weekly - 3 sets - 10 reps - Seated Knee Flexion Stretch  - 1 x daily - 7 x weekly - 3 sets - 10 reps  ASSESSMENT:  CLINICAL IMPRESSION: Patient is a 56 y.o. female who was seen today for physical therapy evaluation and treatment for R TKR on 06/22/22. She presents today with limitations in AROM/PROM, muscle strength, quad isolation, edema, gait, balance and ability to perform her ADLs/IADLs. PT to address deficits to promote improved function and to work towards the patient's goal of being able to ambulate without pain.  OBJECTIVE IMPAIRMENTS: Abnormal gait, decreased activity tolerance, decreased balance, difficulty walking, decreased ROM, decreased strength, increased edema, increased fascial restrictions, and pain.   ACTIVITY LIMITATIONS: carrying, bending, standing, squatting, stairs, and locomotion level  PARTICIPATION LIMITATIONS: cleaning, driving, shopping, and community activity  PERSONAL FACTORS: 1-2 comorbidities: fibromyalgia, h/o frequent falls with imbalance  are also affecting patient's functional outcome.   REHAB POTENTIAL: Good  CLINICAL DECISION MAKING: Stable/uncomplicated  EVALUATION COMPLEXITY: Low  GOALS: Goals reviewed with patient? Yes   SHORT TERM GOALS: Target date: 08/03/2022    Independent with initial HEP. Baseline: Has HEP from [facility]-- PT progressed today. Goal status: INITIAL  2.  The patient will improve AROM R knee to 104 degrees flexion.  Baseline: 85 degrees Goal status: INITIAL  3.  The patient will improve AROM R knee to -10 degrees during LAQ. Baseline: -18 degrees Goal status:  INITIAL   LONG TERM GOALS: Target date: 08/31/2022   Independent with advanced/ongoing HEP to improve outcomes and carryover.  Baseline: has initial HEP Goal status: INITIAL  2.  Patient will demonstrate right knee flexion to 115 deg to ascend/descend stairs. Baseline: 85 degrees AROM Goal status: INITIAL  3.  Patient will demonstrate full right knee extension for safety with gait. Baseline: -18 during LAQ Goal status: INITIAL  4.  Patient will be able to ambulate 500' safely with LRAD and normal gait pattern to access community.  Baseline: 100 + ft with RW mod indep Goal status: INITIAL  5.  Patient will be able to ascend/descend stairs with 1 HR and reciprocal step pattern safely to access home and community.  Baseline: step to pattern with bilat HRs Goal status: INITIAL  7.  Patient will demonstrate improved functional LE strength by completing 5x STS in <17 seconds.  (MCID 5 seconds) Baseline: 22.18 seconds Goal status: INITIAL  PLAN:  PT FREQUENCY: 2x/week  PT DURATION: 8 weeks  PLANNED INTERVENTIONS: Therapeutic exercises, Therapeutic activity, Neuromuscular re-education, Balance training, Gait training, Patient/Family education, Self Care, Joint mobilization, Stair training, DME instructions, Aquatic Therapy, Dry Needling, Electrical stimulation, Cryotherapy, Moist heat, scar mobilization, Taping, Vasopneumatic device, and Manual therapy  PLAN FOR NEXT SESSION: Heel cord stretch for HEP, progress HEP, R knee AROM/PROM, gait training reducing reliance on RW, pain mgmt and scar tissue massage when indicated.   Naveen Clardy, PT 07/06/2022, 12:39 PM

## 2022-07-10 ENCOUNTER — Other Ambulatory Visit: Payer: Self-pay

## 2022-07-10 DIAGNOSIS — Q048 Other specified congenital malformations of brain: Secondary | ICD-10-CM

## 2022-07-10 NOTE — Addendum Note (Signed)
Addended by: Venetia Night on: 07/10/2022 09:30 AM   Modules accepted: Orders

## 2022-07-11 ENCOUNTER — Ambulatory Visit: Payer: Medicare HMO

## 2022-07-12 ENCOUNTER — Other Ambulatory Visit: Payer: Self-pay | Admitting: Orthopaedic Surgery

## 2022-07-13 ENCOUNTER — Ambulatory Visit: Payer: Medicare HMO | Attending: Orthopaedic Surgery

## 2022-07-13 ENCOUNTER — Other Ambulatory Visit: Payer: Self-pay | Admitting: Physician Assistant

## 2022-07-13 DIAGNOSIS — M6281 Muscle weakness (generalized): Secondary | ICD-10-CM | POA: Insufficient documentation

## 2022-07-13 DIAGNOSIS — M25561 Pain in right knee: Secondary | ICD-10-CM | POA: Insufficient documentation

## 2022-07-13 DIAGNOSIS — R2689 Other abnormalities of gait and mobility: Secondary | ICD-10-CM | POA: Insufficient documentation

## 2022-07-13 DIAGNOSIS — R600 Localized edema: Secondary | ICD-10-CM

## 2022-07-13 NOTE — Therapy (Signed)
OUTPATIENT PHYSICAL THERAPY LOWER EXTREMITY TREATMENT   Patient Name: Barbara Calderon MRN: [id number] DOB:[birth year], 57 y.o., female Today's Date: 07/13/2022  END OF SESSION:  PT End of Session - 07/13/22 1022     Visit Number 2    Number of Visits 16    Date for PT Re-Evaluation 09/04/22    PT Start Time 1020    PT Stop Time 1102    PT Time Calculation (min) 42 min             Past Medical History:  Diagnosis Date   Abnormal mammogram    Abnormal Pap smear of cervix    Anxiety    Arthritis    Asthma    Depression    GERD (gastroesophageal reflux disease)    Hashimoto's disease    Heart murmur    Echo was good per pt.   Hyperlipidemia    borderline- no meds   Hypothyroidism    Lymphedema    Lymphedema    bilateral legs   Lymphedema    legs   Neuromuscular disorder (HCC)    fibro many years ago told this, neurpathy legs   Pneumonia    PONV (postoperative nausea and vomiting)    slow to wake up   Sleep apnea    mild no cpap   Thyroid disease    Hashimotos   Varicose veins    Past Surgical History:  Procedure Laterality Date   ABDOMINAL HYSTERECTOMY     partial   APPLICATION OF WOUND VAC     on left lower leg above foot   BLADDER SUSPENSION     CESAREAN SECTION     x2   CHOLECYSTECTOMY OPEN     INCISION AND DRAINAGE OF WOUND     PARTIAL HYSTERECTOMY     SKIN GRAFT     TOTAL KNEE ARTHROPLASTY Right 06/22/2022   Procedure: RIGHT TOTAL KNEE ARTHROPLASTY;  Surgeon: Mcarthur Rossetti, MD;  Location: [facility];  Service: Orthopedics;  Laterality: Right;   TUBAL LIGATION     Patient Active Problem List   Diagnosis Date Noted   Status post total right knee replacement 06/22/2022   Preoperative clearance 04/11/2022   Primary osteoarthritis of left ankle 12/12/2021   Abnormal finding on MRI of brain 08/03/2021   Chronic intractable headache 08/03/2021   Mild sleep apnea 04/15/2019   Chronic venous insufficiency 11/12/2014   Family history of DVT  11/12/2014   Lymphedema 11/12/2014   Vaginal vault prolapse 11/06/2013   SUI (stress urinary incontinence, female) 08/03/2013   Obesity, morbid, BMI 40.0-49.9 ([place]) 08/03/2013   Cystocele 08/03/2013   Asthma 09/20/2011   Depression 08/22/2011   History of gastroesophageal reflux (GERD) 08/22/2011   SHINGLES 08/08/2007   Hypothyroidism 05/22/2007   POLYARTHRALGIA 05/22/2007   LEG EDEMA 05/22/2007   URINARY FREQUENCY 05/22/2007    PCP: Samuel Bouche, NP  REFERRING PROVIDER: Jean Rosenthal, MD  REFERRING DIAG: s/p R TKR  THERAPY DIAG:  Muscle weakness (generalized)  Acute pain of right knee  Other abnormalities of gait and mobility  Localized edema  Rationale for Evaluation and Treatment: Rehabilitation  ONSET DATE: 06/22/2022  SUBJECTIVE:   SUBJECTIVE STATEMENT: Patient reports she has been doing exercises at home but has been having difficulty with flexion past 90 degrees. Patient reports increased numbness in L foot, as well as starting in R foot; she has an appointment on Tuesday to follow up with MD.  PERTINENT HISTORY: GERD, HLD, lymphedema, fibromyalgia, h/o imbalance (vision,  hearing changes)-- MS ruled out. H/o migraines. PAIN:  Are you having pain? Yes: NPRS scale: 3/10 Pain location: R knee Pain description: much improved since eval Aggravating factors: ROM in therapy Relieving factors: meds  PRECAUTIONS: None  WEIGHT BEARING RESTRICTIONS: No  FALLS:  Has patient fallen in last 6 months? Yes. Number of falls estimates 6  LIVING ENVIRONMENT: Lives with: lives with their family *She is working to adopt a 58 year old and is primary caregiver for 58year old. Lives in: House/apartment Stairs: Yes: External: 4 steps; has rail Has following equipment at home: Gilford Rile - 2 wheeled; used a cane in the past  OCCUPATION: retired Scientific laboratory technician; on disability due to problems with lymphedema  PLOF: Independent  PATIENT GOALS: Improve mobility and  stability. "I want to be able to walk."  NEXT MD VISIT:  in 4 weeks  OBJECTIVE:   PATIENT SURVEYS:  FOTO 52% (goal to 77%)  COGNITION: Overall cognitive status: Within functional limits for tasks assessed     SENSATION: Some h/o abnormal sensation in her L toes (may be from prior meds)  EDEMA:  Has a h/o lymphedema Circumferential measurements: L 19.5" and R 21.0" Incision is well approximated and clean/dry with steri-strips intact  POSTURE: Maintains mild R knee flexion in standing  PALPATION: Tenderness over the lateral distal aspect of the knee  LOWER EXTREMITY ROM:  AROM Right eval Left eval  Knee flexion 85   Knee extension -18                     PROM    Knee flexion 92   Knee extension -12    LOWER EXTREMITY MMT: >3/5 throughout LEs   FUNCTIONAL TESTS:  5 times sit to stand: 22.18 seconds without UEs  Gait speed=20 ft/8.08 seconds=2.48 ft/sec  GAIT: Distance walked: 100 Assistive device utilized: Environmental consultant - 2 wheeled Level of assistance: Modified independence Comments: R knee maintains mild flexion with foot flat Stairs: mod indep step to pattern with bilat handrails x 4 steps  TODAY'S TREATMENT:     [facility] Adult PT Treatment:                                                DATE: 07/13/2022 Therapeutic Exercise: NuStep L 2-3 (gradually sliding seat forward) x 10 min + subjective intake Supine: SLR + quad set 2x10 heel slides w/strap 10x5" S/L straight leg hip abd 2x10 Seated: LAQ + ankle pump x15 Hip add & abd isometrics 10x5" each Knee flexion staggered stance 2x10 Gait: Amb FWW 3L focusing on functional knee flexion and step through reciprocal gait pattern   07/06/22  SLR x 10 Quad set x 10 with towel under ankle Sit<>stand x 5 reps Supine heel slide x 5 Sidelying hip abduction x 10  PATIENT EDUCATION:  Education details: HEP Person educated: Patient Education method: Consulting civil engineer, Media planner, and Handouts Education comprehension:  verbalized understanding and returned demonstration  HOME EXERCISE PROGRAM: Access Code: [email or web address] URL: [email or web address] Date: 07/13/2022 Prepared by: Helane Gunther  Exercises - Quad Setting and Stretching  - 2 x daily - 7 x weekly - 1 sets - 10 reps - Supine Active Straight Leg Raise  - 1 x daily - 7 x weekly - 3 sets - 15 reps - Supine Heel Slides  - 1 x daily - 7 x weekly - 3  sets - 15 reps - Sidelying Hip Abduction  - 2 x daily - 7 x weekly - 1 sets - 10 reps - Seated Long Arc Quad  - 1 x daily - 7 x weekly - 3 sets - 10 reps - Seated Knee Flexion Stretch  - 1 x daily - 7 x weekly - 3 sets - 10 reps - Long Sitting Ankle Pumps  - 1 x daily - 7 x weekly - 3 sets - 10 reps - Seated Pelvic Floor Contraction with Isometric Hip Adduction  - 1 x daily - 7 x weekly - 3 sets - 10 reps - 5 sec hold - Seated Pelvic Floor Contraction with Hip Abduction and Resistance Loop  - 1 x daily - 7 x weekly - 3 sets - 10 repss  ASSESSMENT:  CLINICAL IMPRESSION: Verbal cues improved glute activation during seated hip abd isometric exercise. Gait training performed with focus on functional knee flexion during swing phase; decreased step through noted with R LE during ambulation.  OBJECTIVE IMPAIRMENTS: Abnormal gait, decreased activity tolerance, decreased balance, difficulty walking, decreased ROM, decreased strength, increased edema, increased fascial restrictions, and pain.   ACTIVITY LIMITATIONS: carrying, bending, standing, squatting, stairs, and locomotion level  PARTICIPATION LIMITATIONS: cleaning, driving, shopping, and community activity  PERSONAL FACTORS: 1-2 comorbidities: fibromyalgia, h/o frequent falls with imbalance  are also affecting patient's functional outcome.   REHAB POTENTIAL: Good  CLINICAL DECISION MAKING: Stable/uncomplicated  EVALUATION COMPLEXITY: Low  GOALS: Goals reviewed with patient? Yes   SHORT TERM GOALS: Target date: 08/03/2022    Independent  with initial HEP. Baseline: Has HEP from [facility]-- PT progressed today. Goal status: INITIAL  2.  The patient will improve AROM R knee to 104 degrees flexion.  Baseline: 85 degrees Goal status: INITIAL  3.  The patient will improve AROM R knee to -10 degrees during LAQ. Baseline: -18 degrees Goal status: INITIAL   LONG TERM GOALS: Target date: 08/31/2022   Independent with advanced/ongoing HEP to improve outcomes and carryover.  Baseline: has initial HEP Goal status: INITIAL  2.  Patient will demonstrate right knee flexion to 115 deg to ascend/descend stairs. Baseline: 85 degrees AROM Goal status: INITIAL  3.  Patient will demonstrate full right knee extension for safety with gait. Baseline: -18 during LAQ Goal status: INITIAL  4.  Patient will be able to ambulate 500' safely with LRAD and normal gait pattern to access community.  Baseline: 100 + ft with RW mod indep Goal status: INITIAL  5.  Patient will be able to ascend/descend stairs with 1 HR and reciprocal step pattern safely to access home and community.  Baseline: step to pattern with bilat HRs Goal status: INITIAL  7.  Patient will demonstrate improved functional LE strength by completing 5x STS in <17 seconds.  (MCID 5 seconds) Baseline: 22.18 seconds Goal status: INITIAL  PLAN:  PT FREQUENCY: 2x/week  PT DURATION: 8 weeks  PLANNED INTERVENTIONS: Therapeutic exercises, Therapeutic activity, Neuromuscular re-education, Balance training, Gait training, Patient/Family education, Self Care, Joint mobilization, Stair training, DME instructions, Aquatic Therapy, Dry Needling, Electrical stimulation, Cryotherapy, Moist heat, scar mobilization, Taping, Vasopneumatic device, and Manual therapy  PLAN FOR NEXT SESSION: Heel cord stretch for HEP, progress HEP, R knee AROM/PROM, gait training reducing reliance on RW, pain mgmt and scar tissue massage when indicated.   Sanjuana Mae, PTA 07/13/2022, 11:03 AM

## 2022-07-17 ENCOUNTER — Ambulatory Visit: Payer: Medicare HMO | Admitting: Physical Therapy

## 2022-07-17 ENCOUNTER — Encounter: Payer: Self-pay | Admitting: Physical Therapy

## 2022-07-17 DIAGNOSIS — M6281 Muscle weakness (generalized): Secondary | ICD-10-CM

## 2022-07-17 DIAGNOSIS — R2689 Other abnormalities of gait and mobility: Secondary | ICD-10-CM | POA: Diagnosis not present

## 2022-07-17 DIAGNOSIS — M25561 Pain in right knee: Secondary | ICD-10-CM

## 2022-07-17 DIAGNOSIS — R600 Localized edema: Secondary | ICD-10-CM | POA: Diagnosis not present

## 2022-07-17 NOTE — Therapy (Signed)
OUTPATIENT PHYSICAL THERAPY LOWER EXTREMITY TREATMENT   Patient Name: Barbara Calderon MRN: [id number] DOB:05-Dec-1965, 57 y.o., female Today's Date: 07/17/2022  END OF SESSION:  PT End of Session - 07/17/22 1603     Visit Number 3    Number of Visits 16    Date for PT Re-Evaluation 09/04/22    PT Start Time 1603    PT Stop Time 1645    PT Time Calculation (min) 42 min    Activity Tolerance Patient tolerated treatment well    Behavior During Therapy WFL for tasks assessed/performed              Past Medical History:  Diagnosis Date   Abnormal mammogram    Abnormal Pap smear of cervix    Anxiety    Arthritis    Asthma    Depression    GERD (gastroesophageal reflux disease)    Hashimoto's disease    Heart murmur    Echo was good per pt.   Hyperlipidemia    borderline- no meds   Hypothyroidism    Lymphedema    Lymphedema    bilateral legs   Lymphedema    legs   Neuromuscular disorder (HCC)    fibro many years ago told this, neurpathy legs   Pneumonia    PONV (postoperative nausea and vomiting)    slow to wake up   Sleep apnea    mild no cpap   Thyroid disease    Hashimotos   Varicose veins    Past Surgical History:  Procedure Laterality Date   ABDOMINAL HYSTERECTOMY     partial   APPLICATION OF WOUND VAC     on left lower leg above foot   BLADDER SUSPENSION     CESAREAN SECTION     x2   CHOLECYSTECTOMY OPEN     INCISION AND DRAINAGE OF WOUND     PARTIAL HYSTERECTOMY     SKIN GRAFT     TOTAL KNEE ARTHROPLASTY Right 06/22/2022   Procedure: RIGHT TOTAL KNEE ARTHROPLASTY;  Surgeon: [name], MD;  Location: [facility];  Service: Orthopedics;  Laterality: Right;   TUBAL LIGATION     Patient Active Problem List   Diagnosis Date Noted   Status post total right knee replacement 06/22/2022   Preoperative clearance 04/11/2022   Primary osteoarthritis of left ankle 12/12/2021   Abnormal finding on MRI of brain 08/03/2021   Chronic intractable  headache 08/03/2021   Mild sleep apnea 04/15/2019   Chronic venous insufficiency 11/12/2014   Family history of DVT 11/12/2014   Lymphedema 11/12/2014   Vaginal vault prolapse 11/06/2013   SUI (stress urinary incontinence, female) 08/03/2013   Obesity, morbid, BMI 40.0-49.9 (HCC) 08/03/2013   Cystocele 08/03/2013   Asthma 09/20/2011   Depression 08/22/2011   History of gastroesophageal reflux (GERD) 08/22/2011   SHINGLES 08/08/2007   Hypothyroidism 05/22/2007   POLYARTHRALGIA 05/22/2007   LEG EDEMA 05/22/2007   URINARY FREQUENCY 05/22/2007    PCP: Christen Butter, NP  REFERRING PROVIDER: Doneen Poisson, MD  REFERRING DIAG: s/p R TKR  THERAPY DIAG:  Muscle weakness (generalized)  Acute pain of right knee  Other abnormalities of gait and mobility  Rationale for Evaluation and Treatment: Rehabilitation  ONSET DATE: 06/22/2022  SUBJECTIVE:   SUBJECTIVE STATEMENT: Pt states knee has been hurting all week last week and this week. Pt states her knee remains extra tight and getting cramps at night.   PERTINENT HISTORY: GERD, HLD, lymphedema, fibromyalgia, h/o imbalance (vision, hearing  changes)-- MS ruled out. H/o migraines. PAIN:  Are you having pain? Yes: NPRS scale: 4/10 Pain location: R knee Pain description: much improved since eval Aggravating factors: ROM in therapy Relieving factors: meds  PRECAUTIONS: None  WEIGHT BEARING RESTRICTIONS: No  FALLS:  Has patient fallen in last 6 months? Yes. Number of falls estimates 6  LIVING ENVIRONMENT: Lives with: lives with their family *She is working to adopt a 58 year old and is primary caregiver for 58year old. Lives in: House/apartment Stairs: Yes: External: 4 steps; has rail Has following equipment at home: Gilford Rile - 2 wheeled; used a cane in the past  OCCUPATION: retired Scientific laboratory technician; on disability due to problems with lymphedema  PLOF: Independent  PATIENT GOALS: Improve mobility and stability. "I  want to be able to walk."  NEXT MD VISIT:  in 4 weeks  OBJECTIVE:   PATIENT SURVEYS:  FOTO 52% (goal to 77%)   SENSATION: Some h/o abnormal sensation in her L toes (may be from prior meds)  EDEMA:  Has a h/o lymphedema Circumferential measurements: L 19.5" and R 21.0" Incision is well approximated and clean/dry with steri-strips intact  POSTURE: Maintains mild R knee flexion in standing  PALPATION: Tenderness over the lateral distal aspect of the knee  LOWER EXTREMITY ROM:  AROM Right eval Left eval  Knee flexion 85   Knee extension -18                     PROM    Knee flexion 92   Knee extension -12    LOWER EXTREMITY MMT: >3/5 throughout LEs   FUNCTIONAL TESTS:  5 times sit to stand: 22.18 seconds without UEs  Gait speed=20 ft/8.08 seconds=2.48 ft/sec  GAIT: Distance walked: 100 Assistive device utilized: Environmental consultant - 2 wheeled Level of assistance: Modified independence Comments: R knee maintains mild flexion with foot flat Stairs: mod indep step to pattern with bilat handrails x 4 steps  [facility] Adult PT Treatment:                                                DATE: 07/17/22 Therapeutic Exercise: NuStep L5 x 5 min UEs/LEs warm up Slant board Gastroc stretch 3x30 sec R Soleus stretch 2x30 sec R Sitting Hamstring stretch 2x30 sec R Supine Heel slide with strap 10x3 sec Hip flexor + quad stretch with strap 2x30 sec Prone Hamstring curl 2x10 Quad set 2x10 Standing TKE against ball 2x10x3 sec Manual Therapy: STM, TPR, IASTM massage roller quad, gastroc, peroneals Knee flexion PROM Grade II AP tibiofemoral mobs for flexion Gait: Amb x 3 laps around gym RW SBA. Cues for heel strike and allowing knee flexion during toe off Backwards walking 2x15'     [facility] Adult PT Treatment:                                                DATE: 07/13/2022 Therapeutic Exercise: NuStep L 2-3 (gradually sliding seat forward) x 10 min + subjective intake Supine: SLR + quad set  2x10 heel slides w/strap 10x5" S/L straight leg hip abd 2x10 Seated: LAQ + ankle pump x15 Hip add & abd isometrics 10x5" each Knee flexion staggered stance 2x10 Gait: Amb FWW  3L focusing on functional knee flexion and step through reciprocal gait pattern   07/06/22  SLR x 10 Quad set x 10 with towel under ankle Sit<>stand x 5 reps Supine heel slide x 5 Sidelying hip abduction x 10  PATIENT EDUCATION:  Education details: HEP Person educated: Patient Education method: Consulting civil engineer, Media planner, and Handouts Education comprehension: verbalized understanding and returned demonstration  HOME EXERCISE PROGRAM: Access Code: [email or web address] URL: [email or web address] Date: 07/17/2022 Prepared by: Estill Bamberg April Thurnell Garbe  Exercises - Quad Setting and Stretching  - 2 x daily - 7 x weekly - 1 sets - 10 reps - Supine Active Straight Leg Raise  - 1 x daily - 7 x weekly - 3 sets - 15 reps - Supine Heel Slides  - 1 x daily - 7 x weekly - 3 sets - 15 reps - Sidelying Hip Abduction  - 2 x daily - 7 x weekly - 1 sets - 10 reps - Seated Long Arc Quad  - 1 x daily - 7 x weekly - 3 sets - 10 reps - Seated Knee Flexion Stretch  - 1 x daily - 7 x weekly - 3 sets - 10 reps - Long Sitting Ankle Pumps  - 1 x daily - 7 x weekly - 3 sets - 10 reps - Seated Pelvic Floor Contraction with Isometric Hip Adduction  - 1 x daily - 7 x weekly - 3 sets - 10 reps - 5 sec hold - Seated Pelvic Floor Contraction with Hip Abduction and Resistance Loop  - 1 x daily - 7 x weekly - 3 sets - 10 reps - Gastroc Stretch with Foot at Wall  - 1 x daily - 7 x weekly - 2 sets - 30 sec hold - Standing Soleus Stretch on Step  - 1 x daily - 7 x weekly - 2 sets - 30 sec hold - Supine Quadriceps Stretch with Strap on Table  - 1 x daily - 7 x weekly - 2 sets - 30 sec hold  ASSESSMENT:  CLINICAL IMPRESSION: Pt reports continued tightness in quads and calf. Provided manual work and stretches this session to see if this  will ease pt's cramping and pain. Continued to work on ROM and knee/hip strengthening.   OBJECTIVE IMPAIRMENTS: Abnormal gait, decreased activity tolerance, decreased balance, difficulty walking, decreased ROM, decreased strength, increased edema, increased fascial restrictions, and pain.    GOALS: Goals reviewed with patient? Yes   SHORT TERM GOALS: Target date: 08/03/2022    Independent with initial HEP. Baseline: Has HEP from [facility]-- PT progressed today. Goal status: INITIAL  2.  The patient will improve AROM R knee to 104 degrees flexion.  Baseline: 85 degrees Goal status: INITIAL  3.  The patient will improve AROM R knee to -10 degrees during LAQ. Baseline: -18 degrees Goal status: INITIAL   LONG TERM GOALS: Target date: 08/31/2022   Independent with advanced/ongoing HEP to improve outcomes and carryover.  Baseline: has initial HEP Goal status: INITIAL  2.  Patient will demonstrate right knee flexion to 115 deg to ascend/descend stairs. Baseline: 85 degrees AROM Goal status: INITIAL  3.  Patient will demonstrate full right knee extension for safety with gait. Baseline: -18 during LAQ Goal status: INITIAL  4.  Patient will be able to ambulate 500' safely with LRAD and normal gait pattern to access community.  Baseline: 100 + ft with RW mod indep Goal status: INITIAL  5.  Patient will be able to ascend/descend stairs with 1 HR  and reciprocal step pattern safely to access home and community.  Baseline: step to pattern with bilat HRs Goal status: INITIAL  7.  Patient will demonstrate improved functional LE strength by completing 5x STS in <17 seconds.  (MCID 5 seconds) Baseline: 22.18 seconds Goal status: INITIAL  PLAN:  PT FREQUENCY: 2x/week  PT DURATION: 8 weeks  PLANNED INTERVENTIONS: Therapeutic exercises, Therapeutic activity, Neuromuscular re-education, Balance training, Gait training, Patient/Family education, Self Care, Joint mobilization, Stair training,  DME instructions, Aquatic Therapy, Dry Needling, Electrical stimulation, Cryotherapy, Moist heat, scar mobilization, Taping, Vasopneumatic device, and Manual therapy  PLAN FOR NEXT SESSION: progress HEP, R knee AROM/PROM, gait training reducing reliance on RW, pain mgmt and scar tissue massage when indicated.   Stacy Sailer April Ma L Somaly Marteney, PT 07/17/2022, 4:04 PM

## 2022-07-18 ENCOUNTER — Ambulatory Visit (INDEPENDENT_AMBULATORY_CARE_PROVIDER_SITE_OTHER): Payer: Medicare HMO | Admitting: Sports Medicine

## 2022-07-18 ENCOUNTER — Ambulatory Visit (INDEPENDENT_AMBULATORY_CARE_PROVIDER_SITE_OTHER): Payer: Medicare HMO

## 2022-07-18 DIAGNOSIS — M47816 Spondylosis without myelopathy or radiculopathy, lumbar region: Secondary | ICD-10-CM

## 2022-07-18 DIAGNOSIS — M19072 Primary osteoarthritis, left ankle and foot: Secondary | ICD-10-CM

## 2022-07-18 MED ORDER — PREDNISONE 50 MG PO TABS: ORAL_TABLET | ORAL | 0 refills | Status: DC

## 2022-07-18 NOTE — Assessment & Plan Note (Signed)
Bilateral lower extremity paresthesias, axial back pain, suspect lumbar spinal stenosis. She does have morbid obesity. She needs to work aggressively on her lumbar spine core musculature. She is on max tolerated gabapentin. I would like lumbar spine x-rays, adding 5 days of prednisone, home conditioning. Return to see me in 6 weeks, MR for interventional planning if no better.

## 2022-07-18 NOTE — Assessment & Plan Note (Signed)
Ankle osteoarthritis, we injected this back in August, she has tibiotalar and subtalar degenerative changes on x-rays. She tells me the injection really did not work but she is not having much pain right now.

## 2022-07-18 NOTE — Progress Notes (Signed)
    Procedures performed today:    None.  Independent interpretation of notes and tests performed by another provider:   None.  Brief History, Exam, Impression, and Recommendations:    Primary osteoarthritis of left ankle Ankle osteoarthritis, we injected this back in August, she has tibiotalar and subtalar degenerative changes on x-rays. She tells me the injection really did not work but she is not having much pain right now.  Lumbar spondylosis Bilateral lower extremity paresthesias, axial back pain, suspect lumbar spinal stenosis. She does have morbid obesity. She needs to work aggressively on her lumbar spine core musculature. She is on max tolerated gabapentin. I would like lumbar spine x-rays, adding 5 days of prednisone, home conditioning. Return to see me in 6 weeks, MR for interventional planning if no better.    ____________________________________________ [name]. [name], M.D., ABFM., CAQSM., AME. Primary Care and Sports Medicine [facility]  Adjunct Professor of [employer]  Risk manager

## 2022-07-19 ENCOUNTER — Ambulatory Visit: Payer: Medicare HMO

## 2022-07-19 DIAGNOSIS — R2689 Other abnormalities of gait and mobility: Secondary | ICD-10-CM | POA: Diagnosis not present

## 2022-07-19 DIAGNOSIS — M25561 Pain in right knee: Secondary | ICD-10-CM

## 2022-07-19 DIAGNOSIS — R600 Localized edema: Secondary | ICD-10-CM | POA: Diagnosis not present

## 2022-07-19 DIAGNOSIS — M6281 Muscle weakness (generalized): Secondary | ICD-10-CM

## 2022-07-19 NOTE — Therapy (Signed)
OUTPATIENT PHYSICAL THERAPY LOWER EXTREMITY TREATMENT   Patient Name: Barbara Calderon MRN: [id number] DOB:31-Aug-1965, 57 y.o., female Today's Date: 07/19/2022  END OF SESSION:  PT End of Session - 07/19/22 1623     Visit Number 4    Number of Visits 16    Date for PT Re-Evaluation 09/04/22    PT Start Time 1623    PT Stop Time 1707    PT Time Calculation (min) 44 min    Activity Tolerance Patient tolerated treatment well    Behavior During Therapy WFL for tasks assessed/performed              Past Medical History:  Diagnosis Date   Abnormal mammogram    Abnormal Pap smear of cervix    Anxiety    Arthritis    Asthma    Depression    GERD (gastroesophageal reflux disease)    Hashimoto's disease    Heart murmur    Echo was good per pt.   Hyperlipidemia    borderline- no meds   Hypothyroidism    Lymphedema    Lymphedema    bilateral legs   Lymphedema    legs   Neuromuscular disorder (HCC)    fibro many years ago told this, neurpathy legs   Pneumonia    PONV (postoperative nausea and vomiting)    slow to wake up   Sleep apnea    mild no cpap   Thyroid disease    Hashimotos   Varicose veins    Past Surgical History:  Procedure Laterality Date   ABDOMINAL HYSTERECTOMY     partial   APPLICATION OF WOUND VAC     on left lower leg above foot   BLADDER SUSPENSION     CESAREAN SECTION     x2   CHOLECYSTECTOMY OPEN     INCISION AND DRAINAGE OF WOUND     PARTIAL HYSTERECTOMY     SKIN GRAFT     TOTAL KNEE ARTHROPLASTY Right 06/22/2022   Procedure: RIGHT TOTAL KNEE ARTHROPLASTY;  Surgeon: [name], MD;  Location: [facility];  Service: Orthopedics;  Laterality: Right;   TUBAL LIGATION     Patient Active Problem List   Diagnosis Date Noted   Lumbar spondylosis 07/18/2022   Status post total right knee replacement 06/22/2022   Preoperative clearance 04/11/2022   Primary osteoarthritis of left ankle 12/12/2021   Abnormal finding on MRI of brain  08/03/2021   Chronic intractable headache 08/03/2021   Mild sleep apnea 04/15/2019   Chronic venous insufficiency 11/12/2014   Family history of DVT 11/12/2014   Lymphedema 11/12/2014   Vaginal vault prolapse 11/06/2013   SUI (stress urinary incontinence, female) 08/03/2013   Obesity, morbid, BMI 40.0-49.9 (HCC) 08/03/2013   Cystocele 08/03/2013   Asthma 09/20/2011   Depression 08/22/2011   History of gastroesophageal reflux (GERD) 08/22/2011   SHINGLES 08/08/2007   Hypothyroidism 05/22/2007   POLYARTHRALGIA 05/22/2007   LEG EDEMA 05/22/2007   URINARY FREQUENCY 05/22/2007    PCP: Christen Butter, NP  REFERRING PROVIDER: Doneen Poisson, MD  REFERRING DIAG: s/p R TKR  THERAPY DIAG:  Muscle weakness (generalized)  Acute pain of right knee  Other abnormalities of gait and mobility  Localized edema  Rationale for Evaluation and Treatment: Rehabilitation  ONSET DATE: 06/22/2022  SUBJECTIVE:   SUBJECTIVE STATEMENT: Patient arrived to session with [facility], states she has been using more around the house. Patient states she continues to get leg cramps at night and occasional lateral knee  pain (R>L).  PERTINENT HISTORY: GERD, HLD, lymphedema, fibromyalgia, h/o imbalance (vision, hearing changes)-- MS ruled out. H/o migraines. PAIN:  Are you having pain? Yes: NPRS scale: 0/10 Pain location: R knee Pain description: much improved since eval Aggravating factors: ROM in therapy Relieving factors: meds  PRECAUTIONS: None  WEIGHT BEARING RESTRICTIONS: No  FALLS:  Has patient fallen in last 6 months? Yes. Number of falls estimates 6  LIVING ENVIRONMENT: Lives with: lives with their family *She is working to adopt a 58 year old and is primary caregiver for 58year old. Lives in: House/apartment Stairs: Yes: External: 4 steps; has rail Has following equipment at home: Dan Humphreys - 2 wheeled; used a cane in the past  OCCUPATION: retired Arts administrator; on disability due to  problems with lymphedema  PLOF: Independent  PATIENT GOALS: Improve mobility and stability. "I want to be able to walk."  NEXT MD VISIT:  in 4 weeks  OBJECTIVE:   PATIENT SURVEYS:  FOTO 52% (goal to 77%)   SENSATION: Some h/o abnormal sensation in her L toes (may be from prior meds)  EDEMA:  Has a h/o lymphedema Circumferential measurements: L 19.5" and R 21.0" Incision is well approximated and clean/dry with steri-strips intact  POSTURE: Maintains mild R knee flexion in standing  PALPATION: Tenderness over the lateral distal aspect of the knee  LOWER EXTREMITY ROM:  AROM Right eval Left eval  Knee flexion 85   Knee extension -18                     PROM    Knee flexion 92   Knee extension -12    LOWER EXTREMITY MMT: >3/5 throughout LEs   FUNCTIONAL TESTS:  5 times sit to stand: 22.18 seconds without UEs  Gait speed=20 ft/8.08 seconds=2.48 ft/sec  GAIT: Distance walked: 100 Assistive device utilized: Environmental consultant - 2 wheeled Level of assistance: Modified independence Comments: R knee maintains mild flexion with foot flat Stairs: mod indep step to pattern with bilat handrails x 4 steps   [facility] Adult PT Treatment:                                                DATE: 07/19/2022 Therapeutic Exercise: NuStep L5 x Slantboard: R gastroc/soleus stretches 2x30" each Seated HS stretch w/strap 2x30" Supine heel slides 15x5" B Prone HS curls 2x10 YTB Prone TKE 10x3"  mREC 4HP x30" each Gait: Staggered stance weight shifting w/focus on big toe push off FWW  Fwd amb SPC SBA - focus on heel strike and allowing knee flexion during toe off  Bkwd amb SPC 2x20' SBA    [facility] Adult PT Treatment:                                                DATE: 07/17/22 Therapeutic Exercise: NuStep L5 x 5 min UEs/LEs warm up Slant board Gastroc stretch 3x30 sec R Soleus stretch 2x30 sec R Sitting Hamstring stretch 2x30 sec R Supine Heel slide with strap 10x3 sec Hip flexor + quad  stretch with strap 2x30 sec Prone Hamstring curl 2x10 Quad set 2x10 Standing TKE against ball 2x10x3 sec Manual Therapy: STM, TPR, IASTM massage roller quad, gastroc, peroneals Knee flexion  PROM Grade II AP tibiofemoral mobs for flexion Gait: Amb x 3 laps around gym RW SBA. Cues for heel strike and allowing knee flexion during toe off Backwards walking 2x15'    PATIENT EDUCATION:  Education details: HEP Person educated: Patient Education method: Explanation, Demonstration, and Handouts Education comprehension: verbalized understanding and returned demonstration  HOME EXERCISE PROGRAM: Access Code: [email or web address] URL: [email or web address] Date: 07/19/2022 Prepared by: Helane Gunther  Exercises - Quad Setting and Stretching  - 2 x daily - 7 x weekly - 1 sets - 10 reps - Supine Active Straight Leg Raise  - 1 x daily - 7 x weekly - 3 sets - 15 reps - Supine Heel Slides  - 1 x daily - 7 x weekly - 3 sets - 15 reps - Sidelying Hip Abduction  - 2 x daily - 7 x weekly - 1 sets - 10 reps - Seated Long Arc Quad  - 1 x daily - 7 x weekly - 3 sets - 10 reps - Seated Knee Flexion Stretch  - 1 x daily - 7 x weekly - 3 sets - 10 reps - Long Sitting Ankle Pumps  - 1 x daily - 7 x weekly - 3 sets - 10 reps - Seated Pelvic Floor Contraction with Isometric Hip Adduction  - 1 x daily - 7 x weekly - 3 sets - 10 reps - 5 sec hold - Seated Pelvic Floor Contraction with Hip Abduction and Resistance Loop  - 1 x daily - 7 x weekly - 3 sets - 10 reps - Gastroc Stretch with Foot at Wall  - 1 x daily - 7 x weekly - 2 sets - 30 sec hold - Standing Soleus Stretch on Step  - 1 x daily - 7 x weekly - 2 sets - 30 sec hold - Supine Quadriceps Stretch with Strap on Table  - 1 x daily - 7 x weekly - 2 sets - 30 sec hold - Prone Terminal Knee Extension  - 1 x daily - 7 x weekly - 3 sets - 10 reps - Prone Knee Flexion  - 1 x daily - 7 x weekly - 3 sets - 10 reps - Standing Balance in Corner with Eyes  Closed  - 1 x daily - 7 x weekly - 1 sets - 5 reps - 30 sec hold  ASSESSMENT:  CLINICAL IMPRESSION: Dynamic knee mobility continued with resisted hamstring curls and total knee extension in prone. Mild postural sway demonstrated during backwards ambulation. Mild postural sway exhibited during standing balance with eyes closed; patient demonstrated fair ankle strategy to maintain balance.  OBJECTIVE IMPAIRMENTS: Abnormal gait, decreased activity tolerance, decreased balance, difficulty walking, decreased ROM, decreased strength, increased edema, increased fascial restrictions, and pain.    GOALS: Goals reviewed with patient? Yes   SHORT TERM GOALS: Target date: 08/03/2022    Independent with initial HEP. Baseline: Has HEP from [facility]-- PT progressed today. Goal status: INITIAL  2.  The patient will improve AROM R knee to 104 degrees flexion.  Baseline: 85 degrees Goal status: INITIAL  3.  The patient will improve AROM R knee to -10 degrees during LAQ. Baseline: -18 degrees Goal status: INITIAL   LONG TERM GOALS: Target date: 08/31/2022   Independent with advanced/ongoing HEP to improve outcomes and carryover.  Baseline: has initial HEP Goal status: INITIAL  2.  Patient will demonstrate right knee flexion to 115 deg to ascend/descend stairs. Baseline: 85 degrees AROM Goal status: INITIAL  3.  Patient will  demonstrate full right knee extension for safety with gait. Baseline: -18 during LAQ Goal status: INITIAL  4.  Patient will be able to ambulate 500' safely with LRAD and normal gait pattern to access community.  Baseline: 100 + ft with RW mod indep Goal status: INITIAL  5.  Patient will be able to ascend/descend stairs with 1 HR and reciprocal step pattern safely to access home and community.  Baseline: step to pattern with bilat HRs Goal status: INITIAL  7.  Patient will demonstrate improved functional LE strength by completing 5x STS in <17 seconds.  (MCID 5  seconds) Baseline: 22.18 seconds Goal status: INITIAL  PLAN:  PT FREQUENCY: 2x/week  PT DURATION: 8 weeks  PLANNED INTERVENTIONS: Therapeutic exercises, Therapeutic activity, Neuromuscular re-education, Balance training, Gait training, Patient/Family education, Self Care, Joint mobilization, Stair training, DME instructions, Aquatic Therapy, Dry Needling, Electrical stimulation, Cryotherapy, Moist heat, scar mobilization, Taping, Vasopneumatic device, and Manual therapy  PLAN FOR NEXT SESSION: progress HEP, R knee AROM/PROM, gait training reducing reliance on RW, pain mgmt and scar tissue massage when indicated.   Hardin Negus, PTA 07/19/2022, 5:08 PM

## 2022-07-21 ENCOUNTER — Other Ambulatory Visit: Payer: Self-pay | Admitting: Medical-Surgical

## 2022-07-24 ENCOUNTER — Ambulatory Visit: Payer: Medicare HMO | Admitting: Physical Therapy

## 2022-07-25 ENCOUNTER — Encounter: Payer: Self-pay | Admitting: Emergency Medicine

## 2022-07-25 ENCOUNTER — Ambulatory Visit
Admission: EM | Admit: 2022-07-25 | Discharge: 2022-07-25 | Disposition: A | Discharge status: Home / Self Care | Payer: Medicare HMO | Attending: Urgent Care | Admitting: Urgent Care

## 2022-07-25 ENCOUNTER — Ambulatory Visit (INDEPENDENT_AMBULATORY_CARE_PROVIDER_SITE_OTHER): Payer: Medicare HMO

## 2022-07-25 DIAGNOSIS — R0781 Pleurodynia: Secondary | ICD-10-CM

## 2022-07-25 DIAGNOSIS — M1712 Unilateral primary osteoarthritis, left knee: Secondary | ICD-10-CM

## 2022-07-25 DIAGNOSIS — S8002XA Contusion of left knee, initial encounter: Secondary | ICD-10-CM | POA: Diagnosis not present

## 2022-07-25 DIAGNOSIS — S8012XA Contusion of left lower leg, initial encounter: Secondary | ICD-10-CM

## 2022-07-25 DIAGNOSIS — G629 Polyneuropathy, unspecified: Secondary | ICD-10-CM | POA: Diagnosis not present

## 2022-07-25 DIAGNOSIS — W19XXXA Unspecified fall, initial encounter: Secondary | ICD-10-CM

## 2022-07-25 DIAGNOSIS — S20211A Contusion of right front wall of thorax, initial encounter: Secondary | ICD-10-CM

## 2022-07-25 DIAGNOSIS — Z043 Encounter for examination and observation following other accident: Secondary | ICD-10-CM | POA: Diagnosis not present

## 2022-07-25 MED ORDER — DICLOFENAC SODIUM 1 % EX GEL: 4.0000 g | Freq: Four times a day (QID) | CUTANEOUS | 0 refills | Status: DC

## 2022-07-25 NOTE — ED Triage Notes (Signed)
Pt states she tripped and fell in her living room a couple hours ago. States she landed on her fireplace on her right side. She is c/o leg pain and pain under her breast.

## 2022-07-25 NOTE — ED Provider Notes (Signed)
[facility]    CSN: [id number] Arrival date & time: 07/25/22  1337      History   Chief Complaint Chief Complaint  Patient presents with   Fall    HPI Barbara Calderon is a 57 y.o. female.   Pleasant 57 year old female presents today due to concerns of fall.  Roughly 1 month ago,, she had a right knee replacement.  She states today, she was trying to step over something without the use of her walker, and fell.  She was guarding her right knee to protect it, but ended up hitting her right rib cage on the brick hearth of her fireplace.  She reports a 7 out of 10 pain to her ribs, worse with inspiration.  She states she is unable to remember exactly how she twisted her left knee, but states that somehow her left knee is causing an 8-9 out of 10 pain and is bruised and swollen.  She reports a neuropathy that appears to be worsening, and states that her entire left leg from the knee down is uncomfortable and painful.  She has chronic lymph edema, but feels that it is worse since the fall an hour or so ago.   Fall    Past Medical History:  Diagnosis Date   Abnormal mammogram    Abnormal Pap smear of cervix    Anxiety    Arthritis    Asthma    Depression    GERD (gastroesophageal reflux disease)    Hashimoto's disease    Heart murmur    Echo was good per pt.   Hyperlipidemia    borderline- no meds   Hypothyroidism    Lymphedema    Lymphedema    bilateral legs   Lymphedema    legs   Neuromuscular disorder (HCC)    fibro many years ago told this, neurpathy legs   Pneumonia    PONV (postoperative nausea and vomiting)    slow to wake up   Sleep apnea    mild no cpap   Thyroid disease    Hashimotos   Varicose veins     Patient Active Problem List   Diagnosis Date Noted   Lumbar spondylosis 07/18/2022   Status post total right knee replacement 06/22/2022   Preoperative clearance 04/11/2022   Primary osteoarthritis of left ankle 12/12/2021   Abnormal  finding on MRI of brain 08/03/2021   Chronic intractable headache 08/03/2021   Mild sleep apnea 04/15/2019   Chronic venous insufficiency 11/12/2014   Family history of DVT 11/12/2014   Lymphedema 11/12/2014   Vaginal vault prolapse 11/06/2013   SUI (stress urinary incontinence, female) 08/03/2013   Obesity, morbid, BMI 40.0-49.9 ([place]) 08/03/2013   Cystocele 08/03/2013   Asthma 09/20/2011   Depression 08/22/2011   History of gastroesophageal reflux (GERD) 08/22/2011   SHINGLES 08/08/2007   Hypothyroidism 05/22/2007   POLYARTHRALGIA 05/22/2007   LEG EDEMA 05/22/2007   URINARY FREQUENCY 05/22/2007    Past Surgical History:  Procedure Laterality Date   ABDOMINAL HYSTERECTOMY     partial   APPLICATION OF WOUND VAC     on left lower leg above foot   BLADDER SUSPENSION     CESAREAN SECTION     x2   CHOLECYSTECTOMY OPEN     INCISION AND DRAINAGE OF WOUND     PARTIAL HYSTERECTOMY     SKIN GRAFT     TOTAL KNEE ARTHROPLASTY Right 06/22/2022   Procedure: RIGHT TOTAL KNEE ARTHROPLASTY;  Surgeon: Mcarthur Rossetti,  MD;  Location: [facility];  Service: Orthopedics;  Laterality: Right;   TUBAL LIGATION      OB History     Gravida  4   Para  4   Term      Preterm      AB      Living         SAB      IAB      Ectopic      Multiple      Live Births  4            Home Medications    Prior to Admission medications   Medication Sig Start Date End Date Taking? Authorizing Provider  diclofenac Sodium (VOLTAREN) 1 % GEL Apply 4 g topically 4 (four) times daily. 07/25/22  Yes Jayel Inks L, PA  albuterol (VENTOLIN HFA) 108 (90 Base) MCG/ACT inhaler Inhale 2 puffs into the lungs every 4 (four) hours as needed for wheezing or shortness of breath. NEEDS APPOINTMENT FOR FURTHER REFILLS. 07/23/22   Samuel Bouche, NP  AMBULATORY NON FORMULARY MEDICATION Knee-high, medium compression, graduated compression stockings. Apply to lower extremities. [email or web address],  Zippered Compression Stockings, medium circ, long length 09/11/21   Samuel Bouche, NP  aspirin 81 MG chewable tablet Chew 1 tablet (81 mg total) by mouth 2 (two) times daily. 06/23/22   Mcarthur Rossetti, MD  citalopram (CELEXA) 40 MG tablet TAKE 1 TABLET BY MOUTH DAILY 12/25/21   Samuel Bouche, NP  furosemide (LASIX) 20 MG tablet Take 1 tablet by mouth daily as needed for fluid or edema. 10/31/19   [provider]  gabapentin (NEURONTIN) 100 MG capsule Take 1 capsule (100 mg total) by mouth daily. Patient taking differently: Take 100 mg by mouth daily. May take an additional 100 mg in the afternoon if needed for leg pain 10/07/20   Samuel Bouche, NP  gabapentin (NEURONTIN) 300 MG capsule Take 1 capsule (300 mg total) by mouth at bedtime. NEEDS APPOINTMENT FOR FURTHER REFILLS. 07/23/22   Samuel Bouche, NP  lidocaine (LIDODERM) 5 % Place 1 patch onto the skin daily. Remove & Discard patch within 12 hours or as directed by MD 01/24/22   Samuel Bouche, NP  meloxicam (MOBIC) 15 MG tablet One tab PO every 24 hours with a meal for 2 weeks, then once every 24 hours prn pain. Patient taking differently: Take 15 mg by mouth daily. 12/12/21   Silverio Decamp, MD  methocarbamol (ROBAXIN) 500 MG tablet Take 1 tablet (500 mg total) by mouth every 6 (six) hours as needed for muscle spasms. 07/12/22   Mcarthur Rossetti, MD  oxybutynin (DITROPAN XL) 10 MG 24 hr tablet Take 1 tablet (10 mg total) by mouth at bedtime. 11/20/21   Samuel Bouche, NP  pantoprazole (PROTONIX) 40 MG tablet TAKE 1 TABLET BY MOUTH DAILY 06/20/22   Samuel Bouche, NP  phentermine 15 MG capsule Take 1 capsule (15 mg total) by mouth every morning. 04/11/22   Samuel Bouche, NP  potassium chloride SA (KLOR-CON) 20 MEQ tablet Take 1 tablet by mouth daily as needed (ONLY ON THE DAYS OF TAKING LASIX). 10/23/19   [provider]  predniSONE (DELTASONE) 50 MG tablet One tab PO daily for 5 days. 07/18/22   Silverio Decamp, MD  SYNTHROID 125  MCG tablet TAKE 1 TABLET BY MOUTH DAILY  BEFORE BREAKFAST 03/01/22   Samuel Bouche, NP  traMADol (ULTRAM) 50 MG tablet Take 1 tablet (50 mg total) by mouth every  6 (six) hours as needed. 07/13/22   [name], MD    Family History Family History  Problem Relation Age of Onset   Colon polyps Mother    Stroke Mother    Hypertension Mother    Colon polyps Father    Prostate cancer Father    Colon cancer Neg Hx    Esophageal cancer Neg Hx    Stomach cancer Neg Hx    Rectal cancer Neg Hx     Social History Social History   Tobacco Use   Smoking status: Never   Smokeless tobacco: Never  Vaping Use   Vaping Use: Never used  Substance Use Topics   Alcohol use: Yes    Comment: rare 3-4 a year   Drug use: No     Allergies   Hydrocodone-acetaminophen and Latex   Review of Systems Review of Systems As per HPI  Physical Exam Triage Vital Signs ED Triage Vitals [07/25/22 1353]  Enc Vitals Group     BP 116/73     Pulse Rate 66     Resp 18     Temp 98 F (36.7 C)     Temp src      SpO2 98 %     Weight      Height      Head Circumference      Peak Flow      Pain Score 8     Pain Loc      Pain Edu?      Excl. in GC?    No data found.  Updated Vital Signs BP 116/73 (BP Location: Right Arm)   Pulse 66   Temp 98 F (36.7 C)   Resp 18   SpO2 98%   Visual Acuity Right Eye Distance:   Left Eye Distance:   Bilateral Distance:    Right Eye Near:   Left Eye Near:    Bilateral Near:     Physical Exam Vitals and nursing note reviewed. Exam conducted with a chaperone present.  Constitutional:      General: She is not in acute distress.    Appearance: Normal appearance. She is obese. She is not ill-appearing or toxic-appearing.  HENT:     Head: Normocephalic and atraumatic.  Cardiovascular:     Rate and Rhythm: Normal rate and regular rhythm.  Pulmonary:     Effort: Pulmonary effort is normal. No respiratory distress.     Breath sounds: Normal  breath sounds. No stridor. No wheezing or rhonchi.  Chest:     Chest wall: Tenderness (reproducible tenderness to palpation of the R side of chest wall, T8-10 on R) present.  Musculoskeletal:        General: Tenderness (to L patella) present.     Right lower leg: Edema present.     Left lower leg: Edema present.     Comments: Chronic lymphedema Decreased ROM of B knees, unable to fully extend Pain to LLE distal to knee Negative homan sign Decreased plantar flexion L foot, chronic and stable per pt   Skin:    General: Skin is warm.     Findings: Bruising (to L knee anteromedial to patella) present. No rash.  Neurological:     Mental Status: She is alert and oriented to person, place, and time.     Sensory: Sensory deficit (L foot decreased sensation) present.      UC Treatments / Results  Labs (all labs ordered are listed, but only abnormal results are  displayed) Labs Reviewed  B12 AND FOLATE PANEL  VITAMIN B1, WHOLE BLOOD    EKG   Radiology DG Ribs Unilateral W/Chest Right  Result Date: 07/25/2022 CLINICAL DATA:  Fell onto brick fireplace hearth, RIGHT rib pain rated at 7/10 EXAM: RIGHT RIBS AND CHEST - 3+ VIEW COMPARISON:  None Available. FINDINGS: Normal heart size, mediastinal contours, and pulmonary vascularity. Lungs clear. No pulmonary infiltrate, pleural effusion, or pneumothorax. BB placed at site of symptoms lower RIGHT ribs. No rib fracture or bone destruction. IMPRESSION: No acute abnormalities. Electronically Signed   By: Ulyses Southward M.D.   On: 07/25/2022 14:42   DG Knee Complete 4 Views Left  Result Date: 07/25/2022 CLINICAL DATA:  Recent fall, concern for patellar fracture EXAM: LEFT KNEE - COMPLETE 4 VIEW COMPARISON:  None Available. FINDINGS: No evidence of fracture, dislocation, or joint effusion. Mild tricompartment osteoarthritis, greatest in the lateral compartment. No soft tissue abnormalities. IMPRESSION: 1. No acute fracture or dislocation. 2. Mild  tricompartment osteoarthritis. Electronically Signed   By: Jacob Moores M.D.   On: 07/25/2022 14:40    Procedures Procedures (including critical care time)  Medications Ordered in UC Medications - No data to display  Initial Impression / Assessment and Plan / UC Course  I have reviewed the triage vital signs and the nursing notes.  Pertinent labs & imaging results that were available during my care of the patient were reviewed by me and considered in my medical decision making (see chart for details).     Peripheral neuropathy - appears to be worsening. Pt is not diabetic. Had Xrays performed to assess for spinal stenosis which appear to be neg. Will draw B12 and thiamine levels to ensure it is not metabolic. Fall - mechanical fall at home due to tripping and not having walker. VSS Contusion knee - no acute fx or dislocation noted on xray. Contusion noted. Pt to continue her home mobic and gabapentin as prescribed. Does not have any more topical voltaren gel so will refill today. May also take OTC tylenol OA knee - f/u with sports med/ ortho Chest wall contusion - no evidence of rib fracture. Ice area, may use topical lidocaine patch (pt has at home). Tramadol PRN for severe pain.   Final Clinical Impressions(s) / UC Diagnoses   Final diagnoses:  Peripheral polyneuropathy  Fall, initial encounter  Contusion, knee and lower leg, left, initial encounter  Tricompartment osteoarthritis of left knee  Chest wall contusion, right, initial encounter     Discharge Instructions      Your xrays do not show any fractures. You have a contusion to your chest and your knee.  Please apply your lidoderm patch to the affected area of your chest. You can also purchase this OTC - Salonpas Lidocaine 4% patch. May be worn for up to 12 hours consecutively.  Please fill Voltaren gel. You may apply this to your left knee up to four times daily.  Continue your gabapentin and mobic. You may take  up to 1000mg  of tylenol every 8 hours as needed for additional pain relief.   Only take your Tramadol for severe pain and monitor for drowsiness after taking.  We checked a Vitamin B12, folate, and thiamine level on you today to assess for additional possible causes of your neuropathy. Please follow up with your PCP regarding the results.     ED Prescriptions     Medication Sig Dispense Auth. Provider   diclofenac Sodium (VOLTAREN) 1 % GEL Apply 4 g  topically 4 (four) times daily. 100 g Barbara Calderon, [place] L, [place]      PDMP not reviewed this encounter.   Maretta Bees, [place] 07/25/22 1502

## 2022-07-25 NOTE — Discharge Instructions (Addendum)
Your xrays do not show any fractures. You have a contusion to your chest and your knee.  Please apply your lidoderm patch to the affected area of your chest. You can also purchase this OTC - Salonpas Lidocaine 4% patch. May be worn for up to 12 hours consecutively.  Please fill Voltaren gel. You may apply this to your left knee up to four times daily.  Continue your gabapentin and mobic. You may take up to 1000mg  of tylenol every 8 hours as needed for additional pain relief.   Only take your Tramadol for severe pain and monitor for drowsiness after taking.  We checked a Vitamin B12, folate, and thiamine level on you today to assess for additional possible causes of your neuropathy. Please follow up with your PCP regarding the results.

## 2022-07-26 ENCOUNTER — Ambulatory Visit: Payer: Medicare HMO | Admitting: Physical Therapy

## 2022-07-26 ENCOUNTER — Encounter: Payer: Self-pay | Admitting: Physical Therapy

## 2022-07-26 DIAGNOSIS — R2689 Other abnormalities of gait and mobility: Secondary | ICD-10-CM | POA: Diagnosis not present

## 2022-07-26 DIAGNOSIS — M6281 Muscle weakness (generalized): Secondary | ICD-10-CM | POA: Diagnosis not present

## 2022-07-26 DIAGNOSIS — M25561 Pain in right knee: Secondary | ICD-10-CM | POA: Diagnosis not present

## 2022-07-26 DIAGNOSIS — R600 Localized edema: Secondary | ICD-10-CM

## 2022-07-26 NOTE — Therapy (Signed)
OUTPATIENT PHYSICAL THERAPY LOWER EXTREMITY TREATMENT   Patient Name: Barbara Calderon MRN: [id number] DOB:03-22-1966, 57 y.o., female Today's Date: 07/26/2022  END OF SESSION:  PT End of Session - 07/26/22 1622     Visit Number 5    Number of Visits 16    Date for PT Re-Evaluation 09/04/22    PT Start Time 1622    PT Stop Time 1700    PT Time Calculation (min) 38 min    Activity Tolerance Patient tolerated treatment well    Behavior During Therapy WFL for tasks assessed/performed              Past Medical History:  Diagnosis Date   Abnormal mammogram    Abnormal Pap smear of cervix    Anxiety    Arthritis    Asthma    Depression    GERD (gastroesophageal reflux disease)    Hashimoto's disease    Heart murmur    Echo was good per pt.   Hyperlipidemia    borderline- no meds   Hypothyroidism    Lymphedema    Lymphedema    bilateral legs   Lymphedema    legs   Neuromuscular disorder (HCC)    fibro many years ago told this, neurpathy legs   Pneumonia    PONV (postoperative nausea and vomiting)    slow to wake up   Sleep apnea    mild no cpap   Thyroid disease    Hashimotos   Varicose veins    Past Surgical History:  Procedure Laterality Date   ABDOMINAL HYSTERECTOMY     partial   APPLICATION OF WOUND VAC     on left lower leg above foot   BLADDER SUSPENSION     CESAREAN SECTION     x2   CHOLECYSTECTOMY OPEN     INCISION AND DRAINAGE OF WOUND     PARTIAL HYSTERECTOMY     SKIN GRAFT     TOTAL KNEE ARTHROPLASTY Right 06/22/2022   Procedure: RIGHT TOTAL KNEE ARTHROPLASTY;  Surgeon: [name], MD;  Location: [facility];  Service: Orthopedics;  Laterality: Right;   TUBAL LIGATION     Patient Active Problem List   Diagnosis Date Noted   Lumbar spondylosis 07/18/2022   Status post total right knee replacement 06/22/2022   Preoperative clearance 04/11/2022   Primary osteoarthritis of left ankle 12/12/2021   Abnormal finding on MRI of brain  08/03/2021   Chronic intractable headache 08/03/2021   Mild sleep apnea 04/15/2019   Chronic venous insufficiency 11/12/2014   Family history of DVT 11/12/2014   Lymphedema 11/12/2014   Vaginal vault prolapse 11/06/2013   SUI (stress urinary incontinence, female) 08/03/2013   Obesity, morbid, BMI 40.0-49.9 (HCC) 08/03/2013   Cystocele 08/03/2013   Asthma 09/20/2011   Depression 08/22/2011   History of gastroesophageal reflux (GERD) 08/22/2011   SHINGLES 08/08/2007   Hypothyroidism 05/22/2007   POLYARTHRALGIA 05/22/2007   LEG EDEMA 05/22/2007   URINARY FREQUENCY 05/22/2007    PCP: Christen Butter, NP  REFERRING PROVIDER: Doneen Poisson, MD  REFERRING DIAG: s/p R TKR  THERAPY DIAG:  Muscle weakness (generalized)  Acute pain of right knee  Other abnormalities of gait and mobility  Localized edema  Rationale for Evaluation and Treatment: Rehabilitation  ONSET DATE: 06/22/2022  SUBJECTIVE:   SUBJECTIVE STATEMENT: Patient reports she had a fall yesterday hurting her R rib and L knee.   PERTINENT HISTORY: GERD, HLD, lymphedema, fibromyalgia, h/o imbalance (vision, hearing changes)-- MS ruled  out. H/o migraines. PAIN:  Are you having pain? Yes: NPRS scale: 3/10 Pain location: R knee Pain description: much improved since eval Aggravating factors: ROM in therapy Relieving factors: meds  6/10 rib pain, eases with arms still  10/10 on L knee with palpation  PRECAUTIONS: None  WEIGHT BEARING RESTRICTIONS: No  FALLS:  Has patient fallen in last 6 months? Yes. Number of falls estimates 6  LIVING ENVIRONMENT: Lives with: lives with their family *She is working to adopt a 58 year old and is primary caregiver for 58year old. Lives in: House/apartment Stairs: Yes: External: 4 steps; has rail Has following equipment at home: Gilford Rile - 2 wheeled; used a cane in the past  OCCUPATION: retired Scientific laboratory technician; on disability due to problems with lymphedema  PLOF:  Independent  PATIENT GOALS: Improve mobility and stability. "I want to be able to walk."  NEXT MD VISIT:  in 4 weeks  OBJECTIVE:   PATIENT SURVEYS:  FOTO 52% (goal to 77%)   SENSATION: Some h/o abnormal sensation in her L toes (may be from prior meds)  EDEMA:  Has a h/o lymphedema Circumferential measurements: L 19.5" and R 21.0" Incision is well approximated and clean/dry with steri-strips intact  POSTURE: Maintains mild R knee flexion in standing  PALPATION: Tenderness over the lateral distal aspect of the knee  LOWER EXTREMITY ROM:  AROM Right eval Left eval Right 07/26/22  Knee flexion 85    Knee extension -18  -13                    PROM     Knee flexion 92    Knee extension -12     LOWER EXTREMITY MMT: >3/5 throughout LEs   FUNCTIONAL TESTS:  5 times sit to stand: 22.18 seconds without UEs  Gait speed=20 ft/8.08 seconds=2.48 ft/sec  GAIT: Distance walked: 100 Assistive device utilized: Environmental consultant - 2 wheeled Level of assistance: Modified independence Comments: R knee maintains mild flexion with foot flat Stairs: mod indep step to pattern with bilat handrails x 4 steps  [facility] Adult PT Treatment:                                                DATE: 07/26/2022 Therapeutic Exercise: Recumbent bike x 5 min half revolutions Gastroc stretch with strap 2x30 Hamstring stretch with strap 2 x30 sec Supine knee ext with ankle on bolster + 5 # weight for passive stretch x 2 min Supine knee ext with ankle on bolster quad set 10x5 sec Supine SAQ 2# AW 2x10x3" Prone quad stretch with strap 2x30 sec Prone hamstring curl 2# AW 2x10x3" Prone hip ext 2# AW 2x10 S/L hip abd 2# AW 2x10 Supine ITB stretch with strap x30 sec Manual Therapy and Self Care: Scar massage   [facility] Adult PT Treatment:                                                DATE: 07/19/2022 Therapeutic Exercise: NuStep L5 x 68min Slantboard: R gastroc/soleus stretches 2x30" each Seated HS stretch w/strap  2x30" Supine heel slides 15x5" B Prone HS curls 2x10 YTB Prone TKE 10x3"  mREC 4HP x30" each Gait: Staggered stance weight shifting w/focus on  big toe push off FWW  Fwd amb SPC SBA - focus on heel strike and allowing knee flexion during toe off  Bkwd amb SPC 2x20' SBA      PATIENT EDUCATION:  Education details: HEP Person educated: Patient Education method: Explanation, Demonstration, and Handouts Education comprehension: verbalized understanding and returned demonstration  HOME EXERCISE PROGRAM: Access Code: [email or web address] URL: [email or web address] Date: 07/19/2022 Prepared by: Helane Gunther  Exercises - Quad Setting and Stretching  - 2 x daily - 7 x weekly - 1 sets - 10 reps - Supine Active Straight Leg Raise  - 1 x daily - 7 x weekly - 3 sets - 15 reps - Supine Heel Slides  - 1 x daily - 7 x weekly - 3 sets - 15 reps - Sidelying Hip Abduction  - 2 x daily - 7 x weekly - 1 sets - 10 reps - Seated Long Arc Quad  - 1 x daily - 7 x weekly - 3 sets - 10 reps - Seated Knee Flexion Stretch  - 1 x daily - 7 x weekly - 3 sets - 10 reps - Long Sitting Ankle Pumps  - 1 x daily - 7 x weekly - 3 sets - 10 reps - Seated Pelvic Floor Contraction with Isometric Hip Adduction  - 1 x daily - 7 x weekly - 3 sets - 10 reps - 5 sec hold - Seated Pelvic Floor Contraction with Hip Abduction and Resistance Loop  - 1 x daily - 7 x weekly - 3 sets - 10 reps - Gastroc Stretch with Foot at Wall  - 1 x daily - 7 x weekly - 2 sets - 30 sec hold - Standing Soleus Stretch on Step  - 1 x daily - 7 x weekly - 2 sets - 30 sec hold - Supine Quadriceps Stretch with Strap on Table  - 1 x daily - 7 x weekly - 2 sets - 30 sec hold - Prone Terminal Knee Extension  - 1 x daily - 7 x weekly - 3 sets - 10 reps - Prone Knee Flexion  - 1 x daily - 7 x weekly - 3 sets - 10 reps - Standing Balance in Corner with Eyes Closed  - 1 x daily - 7 x weekly - 1 sets - 5 reps - 30 sec hold  ASSESSMENT:  CLINICAL  IMPRESSION: Performed most of exercises in supine or sitting this session due to increased irritation on L LE with weightbearing after her recent fall. Continued to work on improving knee ROM and strength. Able to tolerate half revolutions on bike.   OBJECTIVE IMPAIRMENTS: Abnormal gait, decreased activity tolerance, decreased balance, difficulty walking, decreased ROM, decreased strength, increased edema, increased fascial restrictions, and pain.    GOALS: Goals reviewed with patient? Yes   SHORT TERM GOALS: Target date: 08/03/2022    Independent with initial HEP. Baseline: Has HEP from [facility]-- PT progressed today. Goal status: INITIAL  2.  The patient will improve AROM R knee to 104 degrees flexion.  Baseline: 85 degrees Goal status: INITIAL  3.  The patient will improve AROM R knee to -10 degrees during LAQ. Baseline: -18 degrees Goal status: INITIAL   LONG TERM GOALS: Target date: 08/31/2022   Independent with advanced/ongoing HEP to improve outcomes and carryover.  Baseline: has initial HEP Goal status: INITIAL  2.  Patient will demonstrate right knee flexion to 115 deg to ascend/descend stairs. Baseline: 85 degrees AROM Goal status: INITIAL  3.  Patient will  demonstrate full right knee extension for safety with gait. Baseline: -18 during LAQ Goal status: INITIAL  4.  Patient will be able to ambulate 500' safely with LRAD and normal gait pattern to access community.  Baseline: 100 + ft with RW mod indep Goal status: INITIAL  5.  Patient will be able to ascend/descend stairs with 1 HR and reciprocal step pattern safely to access home and community.  Baseline: step to pattern with bilat HRs Goal status: INITIAL  7.  Patient will demonstrate improved functional LE strength by completing 5x STS in <17 seconds.  (MCID 5 seconds) Baseline: 22.18 seconds Goal status: INITIAL  PLAN:  PT FREQUENCY: 2x/week  PT DURATION: 8 weeks  PLANNED INTERVENTIONS: Therapeutic  exercises, Therapeutic activity, Neuromuscular re-education, Balance training, Gait training, Patient/Family education, Self Care, Joint mobilization, Stair training, DME instructions, Aquatic Therapy, Dry Needling, Electrical stimulation, Cryotherapy, Moist heat, scar mobilization, Taping, Vasopneumatic device, and Manual therapy  PLAN FOR NEXT SESSION: progress HEP, R knee AROM/PROM, gait training reducing reliance on RW, pain mgmt and scar tissue massage when indicated.   Skylinn Vialpando April Ma L Cassius Cullinane, PT 07/26/2022, 4:22 PM

## 2022-07-27 ENCOUNTER — Encounter: Payer: Self-pay | Admitting: Medical-Surgical

## 2022-07-30 LAB — B12 AND FOLATE PANEL
Folate: 11.8 ng/mL
Vitamin B-12: 358 pg/mL (ref 200–1100)

## 2022-07-30 LAB — VITAMIN B1, WHOLE BLOOD

## 2022-07-30 LAB — EXTRA LAV TOP TUBE

## 2022-07-30 MED ORDER — OXYBUTYNIN CHLORIDE ER 10 MG PO TB24: 10.0000 mg | ORAL_TABLET | Freq: Every day | ORAL | 1 refills | Status: DC

## 2022-07-30 MED ORDER — PANTOPRAZOLE SODIUM 40 MG PO TBEC: 40.0000 mg | DELAYED_RELEASE_TABLET | Freq: Every day | ORAL | 3 refills | Status: DC

## 2022-07-30 MED ORDER — SYNTHROID 125 MCG PO TABS: 125.0000 ug | ORAL_TABLET | Freq: Every day | ORAL | 2 refills | Status: DC

## 2022-07-30 MED ORDER — CITALOPRAM HYDROBROMIDE 40 MG PO TABS: 40.0000 mg | ORAL_TABLET | Freq: Every day | ORAL | 1 refills | Status: DC

## 2022-07-30 MED ORDER — GABAPENTIN 300 MG PO CAPS: 300.0000 mg | ORAL_CAPSULE | Freq: Every day | ORAL | 0 refills | Status: DC

## 2022-07-31 ENCOUNTER — Encounter: Payer: Self-pay | Admitting: Physical Therapy

## 2022-07-31 ENCOUNTER — Ambulatory Visit: Payer: Medicare HMO | Admitting: Physical Therapy

## 2022-07-31 DIAGNOSIS — R600 Localized edema: Secondary | ICD-10-CM

## 2022-07-31 DIAGNOSIS — M25561 Pain in right knee: Secondary | ICD-10-CM

## 2022-07-31 DIAGNOSIS — M6281 Muscle weakness (generalized): Secondary | ICD-10-CM

## 2022-07-31 DIAGNOSIS — R2689 Other abnormalities of gait and mobility: Secondary | ICD-10-CM | POA: Diagnosis not present

## 2022-07-31 NOTE — Therapy (Signed)
OUTPATIENT PHYSICAL THERAPY LOWER EXTREMITY TREATMENT   Patient Name: Barbara Calderon MRN: [id number] DOB:01/12/1966, 57 y.o., female Today's Date: 07/31/2022  END OF SESSION:  PT End of Session - 07/31/22 1623     Visit Number 6    Number of Visits 16    Date for PT Re-Evaluation 09/04/22    PT Start Time 1623    PT Stop Time 1705    PT Time Calculation (min) 42 min    Activity Tolerance Patient tolerated treatment well    Behavior During Therapy WFL for tasks assessed/performed              Past Medical History:  Diagnosis Date   Abnormal mammogram    Abnormal Pap smear of cervix    Anxiety    Arthritis    Asthma    Depression    GERD (gastroesophageal reflux disease)    Hashimoto's disease    Heart murmur    Echo was good per pt.   Hyperlipidemia    borderline- no meds   Hypothyroidism    Lymphedema    Lymphedema    bilateral legs   Lymphedema    legs   Neuromuscular disorder (HCC)    fibro many years ago told this, neurpathy legs   Pneumonia    PONV (postoperative nausea and vomiting)    slow to wake up   Sleep apnea    mild no cpap   Thyroid disease    Hashimotos   Varicose veins    Past Surgical History:  Procedure Laterality Date   ABDOMINAL HYSTERECTOMY     partial   APPLICATION OF WOUND VAC     on left lower leg above foot   BLADDER SUSPENSION     CESAREAN SECTION     x2   CHOLECYSTECTOMY OPEN     INCISION AND DRAINAGE OF WOUND     PARTIAL HYSTERECTOMY     SKIN GRAFT     TOTAL KNEE ARTHROPLASTY Right 06/22/2022   Procedure: RIGHT TOTAL KNEE ARTHROPLASTY;  Surgeon: Mcarthur Rossetti, MD;  Location: [facility];  Service: Orthopedics;  Laterality: Right;   TUBAL LIGATION     Patient Active Problem List   Diagnosis Date Noted   Lumbar spondylosis 07/18/2022   Status post total right knee replacement 06/22/2022   Preoperative clearance 04/11/2022   Primary osteoarthritis of left ankle 12/12/2021   Abnormal finding on MRI of brain  08/03/2021   Chronic intractable headache 08/03/2021   Mild sleep apnea 04/15/2019   Chronic venous insufficiency 11/12/2014   Family history of DVT 11/12/2014   Lymphedema 11/12/2014   Vaginal vault prolapse 11/06/2013   SUI (stress urinary incontinence, female) 08/03/2013   Obesity, morbid, BMI 40.0-49.9 ([place]) 08/03/2013   Cystocele 08/03/2013   Asthma 09/20/2011   Depression 08/22/2011   History of gastroesophageal reflux (GERD) 08/22/2011   SHINGLES 08/08/2007   Hypothyroidism 05/22/2007   POLYARTHRALGIA 05/22/2007   LEG EDEMA 05/22/2007   URINARY FREQUENCY 05/22/2007    PCP: Samuel Bouche, NP  REFERRING PROVIDER: Jean Rosenthal, MD  REFERRING DIAG: s/p R TKR  THERAPY DIAG:  Muscle weakness (generalized)  Acute pain of right knee  Other abnormalities of gait and mobility  Localized edema  Rationale for Evaluation and Treatment: Rehabilitation  ONSET DATE: 06/22/2022  SUBJECTIVE:   SUBJECTIVE STATEMENT: Pt is feeling okay. Pt notes she did wake up last night due to pain.   PERTINENT HISTORY: GERD, HLD, lymphedema, fibromyalgia, h/o imbalance (vision, hearing changes)-- MS  ruled out. H/o migraines. PAIN:  Are you having pain? Yes: NPRS scale: 1/10 Pain location: R knee Pain description: much improved since eval Aggravating factors: ROM in therapy Relieving factors: meds  6/10 rib pain, eases with arms still  10/10 on L knee with palpation  PRECAUTIONS: None  WEIGHT BEARING RESTRICTIONS: No  FALLS:  Has patient fallen in last 6 months? Yes. Number of falls estimates 6  LIVING ENVIRONMENT: Lives with: lives with their family *She is working to adopt a 58 year old and is primary caregiver for 58year old. Lives in: House/apartment Stairs: Yes: External: 4 steps; has rail Has following equipment at home: Dan Humphreys - 2 wheeled; used a cane in the past  OCCUPATION: retired Arts administrator; on disability due to problems with lymphedema  PLOF:  Independent  PATIENT GOALS: Improve mobility and stability. "I want to be able to walk."  NEXT MD VISIT:  in 4 weeks  OBJECTIVE:   PATIENT SURVEYS:  FOTO 52% (goal to 77%)   SENSATION: Some h/o abnormal sensation in her L toes (may be from prior meds)  EDEMA:  Has a h/o lymphedema Circumferential measurements: L 19.5" and R 21.0" Incision is well approximated and clean/dry with steri-strips intact  POSTURE: Maintains mild R knee flexion in standing  PALPATION: Tenderness over the lateral distal aspect of the knee  LOWER EXTREMITY ROM:  AROM Right eval Left eval Right 07/31/22  Knee flexion 85  98  Knee extension -18  -9                    PROM     Knee flexion 92    Knee extension -12     LOWER EXTREMITY MMT: >3/5 throughout LEs   FUNCTIONAL TESTS:  5 times sit to stand: 22.18 seconds without UEs  Gait speed=20 ft/8.08 seconds=2.48 ft/sec  GAIT: Distance walked: 100 Assistive device utilized: Environmental consultant - 2 wheeled Level of assistance: Modified independence Comments: R knee maintains mild flexion with foot flat Stairs: mod indep step to pattern with bilat handrails x 4 steps  [facility] Adult PT Treatment:                                                DATE: 07/31/22 Therapeutic Exercise: Recumbent bike bwd x 3 min, fwd x 3 min full revolutions Sitting Knee flexion self stretch 2x30 sec Hamstring stretch 2x30 sec Quad set 2x10x3 sec SLR x10, with ER x10 Standing Foot tap on 4" step 2x10 Forward step up 2x10 Side step up 2x10 R&L Wall squat 2x10 Gait: 75'x3 laps with SPC SBA. Cues for heel strike and cane negotiation Backwards walking 2x15' with cane SBA   [facility] Adult PT Treatment:                                                DATE: 07/26/2022 Therapeutic Exercise: Recumbent bike x 5 min half revolutions Gastroc stretch with strap 2x30 Hamstring stretch with strap 2 x30 sec Supine knee ext with ankle on bolster + 5 # weight for passive stretch x 2 min Supine  knee ext with ankle on bolster quad set 10x5 sec Supine SAQ 2# AW 2x10x3" Prone quad stretch with strap 2x30 sec Prone  hamstring curl 2# AW 2x10x3" Prone hip ext 2# AW 2x10 S/L hip abd 2# AW 2x10 Supine ITB stretch with strap x30 sec Manual Therapy and Self Care: Scar massage     PATIENT EDUCATION:  Education details: HEP Person educated: Patient Education method: Explanation, Demonstration, and Handouts Education comprehension: verbalized understanding and returned demonstration  HOME EXERCISE PROGRAM: Access Code: [email or web address] URL: [email or web address] Date: 07/19/2022 Prepared by: Helane Gunther  Exercises - Quad Setting and Stretching  - 2 x daily - 7 x weekly - 1 sets - 10 reps - Supine Active Straight Leg Raise  - 1 x daily - 7 x weekly - 3 sets - 15 reps - Supine Heel Slides  - 1 x daily - 7 x weekly - 3 sets - 15 reps - Sidelying Hip Abduction  - 2 x daily - 7 x weekly - 1 sets - 10 reps - Seated Long Arc Quad  - 1 x daily - 7 x weekly - 3 sets - 10 reps - Seated Knee Flexion Stretch  - 1 x daily - 7 x weekly - 3 sets - 10 reps - Long Sitting Ankle Pumps  - 1 x daily - 7 x weekly - 3 sets - 10 reps - Seated Pelvic Floor Contraction with Isometric Hip Adduction  - 1 x daily - 7 x weekly - 3 sets - 10 reps - 5 sec hold - Seated Pelvic Floor Contraction with Hip Abduction and Resistance Loop  - 1 x daily - 7 x weekly - 3 sets - 10 reps - Gastroc Stretch with Foot at Wall  - 1 x daily - 7 x weekly - 2 sets - 30 sec hold - Standing Soleus Stretch on Step  - 1 x daily - 7 x weekly - 2 sets - 30 sec hold - Supine Quadriceps Stretch with Strap on Table  - 1 x daily - 7 x weekly - 2 sets - 30 sec hold - Prone Terminal Knee Extension  - 1 x daily - 7 x weekly - 3 sets - 10 reps - Prone Knee Flexion  - 1 x daily - 7 x weekly - 3 sets - 10 reps - Standing Balance in Corner with Eyes Closed  - 1 x daily - 7 x weekly - 1 sets - 5 reps - 30 sec  hold  ASSESSMENT:  CLINICAL IMPRESSION: Worked on improving standing stability and strengthening on stairs this session. Able to transition to ambulating with cane this session.   OBJECTIVE IMPAIRMENTS: Abnormal gait, decreased activity tolerance, decreased balance, difficulty walking, decreased ROM, decreased strength, increased edema, increased fascial restrictions, and pain.    GOALS: Goals reviewed with patient? Yes   SHORT TERM GOALS: Target date: 08/03/2022    Independent with initial HEP. Baseline: Has HEP from [facility]-- PT progressed today. Goal status: INITIAL  2.  The patient will improve AROM R knee to 104 degrees flexion.  Baseline: 85 degrees Goal status: INITIAL  3.  The patient will improve AROM R knee to -10 degrees during LAQ. Baseline: -18 degrees Goal status: INITIAL   LONG TERM GOALS: Target date: 08/31/2022   Independent with advanced/ongoing HEP to improve outcomes and carryover.  Baseline: has initial HEP Goal status: INITIAL  2.  Patient will demonstrate right knee flexion to 115 deg to ascend/descend stairs. Baseline: 85 degrees AROM Goal status: INITIAL  3.  Patient will demonstrate full right knee extension for safety with gait. Baseline: -18 during LAQ Goal status: INITIAL  4.  Patient will be able to ambulate 500' safely with LRAD and normal gait pattern to access community.  Baseline: 100 + ft with RW mod indep Goal status: INITIAL  5.  Patient will be able to ascend/descend stairs with 1 HR and reciprocal step pattern safely to access home and community.  Baseline: step to pattern with bilat HRs Goal status: INITIAL  7.  Patient will demonstrate improved functional LE strength by completing 5x STS in <17 seconds.  (MCID 5 seconds) Baseline: 22.18 seconds Goal status: INITIAL  PLAN:  PT FREQUENCY: 2x/week  PT DURATION: 8 weeks  PLANNED INTERVENTIONS: Therapeutic exercises, Therapeutic activity, Neuromuscular re-education, Balance  training, Gait training, Patient/Family education, Self Care, Joint mobilization, Stair training, DME instructions, Aquatic Therapy, Dry Needling, Electrical stimulation, Cryotherapy, Moist heat, scar mobilization, Taping, Vasopneumatic device, and Manual therapy  PLAN FOR NEXT SESSION: progress HEP, R knee AROM/PROM, gait training reducing reliance on RW, pain mgmt and scar tissue massage when indicated.   Charlene Detter April Ma L Adelaida Reindel, PT 07/31/2022, 4:26 PM

## 2022-08-01 ENCOUNTER — Telehealth: Payer: Self-pay | Admitting: *Deleted

## 2022-08-01 ENCOUNTER — Ambulatory Visit: Payer: Medicare HMO | Admitting: Orthopaedic Surgery

## 2022-08-01 ENCOUNTER — Encounter: Payer: Self-pay | Admitting: Orthopaedic Surgery

## 2022-08-01 DIAGNOSIS — Z96651 Presence of right artificial knee joint: Secondary | ICD-10-CM

## 2022-08-01 NOTE — Telephone Encounter (Signed)
Ortho bundle in office meeting completed. 

## 2022-08-01 NOTE — Progress Notes (Signed)
The patient is now around 6 weeks status post a right total knee arthroplasty.  She is still ambulating with a rolling walker but is finally turned the corner I believe in terms of her range of motion and mobility with her right knee.  On exam she has almost full extension and her flexion is well past 90 degrees.  Some of flexion nothing is limited by her thigh size and some swelling but overall the knee feels ligamentously stable and is doing much better.  From my standpoint she will continue therapy and working on her mobility and range of motion as well as strengthening the knee.  Will see her back in 4 weeks to see how she is doing overall.  If she looks good at that visit we will then not need to see her for 3 to 6 months.

## 2022-08-02 ENCOUNTER — Ambulatory Visit: Payer: Medicare HMO

## 2022-08-02 DIAGNOSIS — M25561 Pain in right knee: Secondary | ICD-10-CM | POA: Diagnosis not present

## 2022-08-02 DIAGNOSIS — R600 Localized edema: Secondary | ICD-10-CM

## 2022-08-02 DIAGNOSIS — R2689 Other abnormalities of gait and mobility: Secondary | ICD-10-CM

## 2022-08-02 DIAGNOSIS — M6281 Muscle weakness (generalized): Secondary | ICD-10-CM | POA: Diagnosis not present

## 2022-08-02 NOTE — Therapy (Signed)
OUTPATIENT PHYSICAL THERAPY LOWER EXTREMITY TREATMENT   Patient Name: Barbara Calderon MRN: [id number] DOB:1966/05/28, 57 y.o., female Today's Date: 08/02/2022  END OF SESSION:  PT End of Session - 08/02/22 1624     Visit Number 7    Number of Visits 16    Date for PT Re-Evaluation 09/04/22    Authorization Type [employer]    PT Start Time 1620    PT Stop Time 1709    PT Time Calculation (min) 49 min              Past Medical History:  Diagnosis Date   Abnormal mammogram    Abnormal Pap smear of cervix    Anxiety    Arthritis    Asthma    Depression    GERD (gastroesophageal reflux disease)    Hashimoto's disease    Heart murmur    Echo was good per pt.   Hyperlipidemia    borderline- no meds   Hypothyroidism    Lymphedema    Lymphedema    bilateral legs   Lymphedema    legs   Neuromuscular disorder (HCC)    fibro many years ago told this, neurpathy legs   Pneumonia    PONV (postoperative nausea and vomiting)    slow to wake up   Sleep apnea    mild no cpap   Thyroid disease    Hashimotos   Varicose veins    Past Surgical History:  Procedure Laterality Date   ABDOMINAL HYSTERECTOMY     partial   APPLICATION OF WOUND VAC     on left lower leg above foot   BLADDER SUSPENSION     CESAREAN SECTION     x2   CHOLECYSTECTOMY OPEN     INCISION AND DRAINAGE OF WOUND     PARTIAL HYSTERECTOMY     SKIN GRAFT     TOTAL KNEE ARTHROPLASTY Right 06/22/2022   Procedure: RIGHT TOTAL KNEE ARTHROPLASTY;  Surgeon: Mcarthur Rossetti, MD;  Location: [facility];  Service: Orthopedics;  Laterality: Right;   TUBAL LIGATION     Patient Active Problem List   Diagnosis Date Noted   Lumbar spondylosis 07/18/2022   Status post total right knee replacement 06/22/2022   Preoperative clearance 04/11/2022   Primary osteoarthritis of left ankle 12/12/2021   Abnormal finding on MRI of brain 08/03/2021   Chronic intractable headache 08/03/2021   Mild sleep apnea 04/15/2019    Chronic venous insufficiency 11/12/2014   Family history of DVT 11/12/2014   Lymphedema 11/12/2014   Vaginal vault prolapse 11/06/2013   SUI (stress urinary incontinence, female) 08/03/2013   Obesity, morbid, BMI 40.0-49.9 ([place]) 08/03/2013   Cystocele 08/03/2013   Asthma 09/20/2011   Depression 08/22/2011   History of gastroesophageal reflux (GERD) 08/22/2011   SHINGLES 08/08/2007   Hypothyroidism 05/22/2007   POLYARTHRALGIA 05/22/2007   LEG EDEMA 05/22/2007   URINARY FREQUENCY 05/22/2007    PCP: Samuel Bouche, NP  REFERRING PROVIDER: Jean Rosenthal, MD  REFERRING DIAG: s/p R TKR  THERAPY DIAG:  Muscle weakness (generalized)  Acute pain of right knee  Other abnormalities of gait and mobility  Localized edema  Rationale for Evaluation and Treatment: Rehabilitation  ONSET DATE: 06/22/2022  SUBJECTIVE:   SUBJECTIVE STATEMENT: Patient reports no pain however continues to have numbness and cramping in R leg.   PERTINENT HISTORY: GERD, HLD, lymphedema, fibromyalgia, h/o imbalance (vision, hearing changes)-- MS ruled out. H/o migraines. PAIN:  Are you having pain? Yes: NPRS scale: 0/10  Pain location: R knee Pain description: much improved since eval Aggravating factors: ROM in therapy Relieving factors: meds  6/10 rib pain, eases with arms still  10/10 on L knee with palpation  PRECAUTIONS: None  WEIGHT BEARING RESTRICTIONS: No  FALLS:  Has patient fallen in last 6 months? Yes. Number of falls estimates 6  LIVING ENVIRONMENT: Lives with: lives with their family *She is working to adopt a 58 year old and is primary caregiver for 58year old. Lives in: House/apartment Stairs: Yes: External: 4 steps; has rail Has following equipment at home: Gilford Rile - 2 wheeled; used a cane in the past  OCCUPATION: retired Scientific laboratory technician; on disability due to problems with lymphedema  PLOF: Independent  PATIENT GOALS: Improve mobility and stability. "I want to be  able to walk."  NEXT MD VISIT:  in 4 weeks  OBJECTIVE:   PATIENT SURVEYS:  FOTO 52% (goal to 77%) 08/02/22:    SENSATION: Some h/o abnormal sensation in her L toes (may be from prior meds)  EDEMA:  Has a h/o lymphedema Circumferential measurements: L 19.5" and R 21.0" Incision is well approximated and clean/dry with steri-strips intact  POSTURE: Maintains mild R knee flexion in standing  PALPATION: Tenderness over the lateral distal aspect of the knee  LOWER EXTREMITY ROM:  AROM Right eval Left eval Right 07/31/22  Knee flexion 85  98  Knee extension -18  -9                    PROM     Knee flexion 92    Knee extension -12     LOWER EXTREMITY MMT: >3/5 throughout LEs   FUNCTIONAL TESTS:  5 times sit to stand: 22.18 seconds without UEs  Gait speed=20 ft/8.08 seconds=2.48 ft/sec  GAIT: Distance walked: 100 Assistive device utilized: Environmental consultant - 2 wheeled Level of assistance: Modified independence Comments: R knee maintains mild flexion with foot flat Stairs: mod indep step to pattern with bilat handrails x 4 steps   [facility] Adult PT Treatment:                                                DATE: 08/02/2022 Therapeutic Exercise: NuStep L6 x 67min (gradually moving seat closer) Sitting Knee flexion self stretch 2x30 sec Hamstring stretch 2x30 sec SLR x10, with ER 2x10 SAQ 2x10x3 sec R heel slide with strap 10x5" Standing Foot tap on 6" step  + forward weight shifts x15 4" stair Forward step up 2x10 4" step Side step up 2x10 R&L Wall squat 2x10 Side stepping YTB Fwd zig-zag stepping YTB Fwd amb with focus on heel strike (SPC) Bkwd amb (SPC)   [facility] Adult PT Treatment:                                                DATE: 07/31/22 Therapeutic Exercise: Recumbent bike bwd x 3 min, fwd x 3 min full revolutions Sitting Knee flexion self stretch 2x30 sec Hamstring stretch 2x30 sec Quad set 2x10x3 sec SLR x10, with ER x10 Standing Foot tap on 4" step  2x10 Forward step up 2x10 Side step up 2x10 R&L Wall squat 2x10 Gait: 75'x3 laps with SPC SBA. Cues for heel strike and  cane negotiation Backwards walking 2x15' with cane SBA    PATIENT EDUCATION:  Education details: HEP Person educated: Patient Education method: Explanation, Demonstration, and Handouts Education comprehension: verbalized understanding and returned demonstration  HOME EXERCISE PROGRAM: Access Code: [email or web address] URL: [email or web address] Date: 07/19/2022 Prepared by: Carlynn Herald  Exercises - Quad Setting and Stretching  - 2 x daily - 7 x weekly - 1 sets - 10 reps - Supine Active Straight Leg Raise  - 1 x daily - 7 x weekly - 3 sets - 15 reps - Supine Heel Slides  - 1 x daily - 7 x weekly - 3 sets - 15 reps - Sidelying Hip Abduction  - 2 x daily - 7 x weekly - 1 sets - 10 reps - Seated Long Arc Quad  - 1 x daily - 7 x weekly - 3 sets - 10 reps - Seated Knee Flexion Stretch  - 1 x daily - 7 x weekly - 3 sets - 10 reps - Long Sitting Ankle Pumps  - 1 x daily - 7 x weekly - 3 sets - 10 reps - Seated Pelvic Floor Contraction with Isometric Hip Adduction  - 1 x daily - 7 x weekly - 3 sets - 10 reps - 5 sec hold - Seated Pelvic Floor Contraction with Hip Abduction and Resistance Loop  - 1 x daily - 7 x weekly - 3 sets - 10 reps - Gastroc Stretch with Foot at Wall  - 1 x daily - 7 x weekly - 2 sets - 30 sec hold - Standing Soleus Stretch on Step  - 1 x daily - 7 x weekly - 2 sets - 30 sec hold - Supine Quadriceps Stretch with Strap on Table  - 1 x daily - 7 x weekly - 2 sets - 30 sec hold - Prone Terminal Knee Extension  - 1 x daily - 7 x weekly - 3 sets - 10 reps - Prone Knee Flexion  - 1 x daily - 7 x weekly - 3 sets - 10 reps - Standing Balance in Corner with Eyes Closed  - 1 x daily - 7 x weekly - 1 sets - 5 reps - 30 sec hold  ASSESSMENT:  CLINICAL IMPRESSION: Continued interventions focused on dynamic knee flexion and quad strengthening. Anterior  weight shifting added to foot tap to progress stair training on higher step. Cueing improved heel strike and knee flexion during swing phase during gait training. Mild instability and NBOS demonstrated during backwards walking.  OBJECTIVE IMPAIRMENTS: Abnormal gait, decreased activity tolerance, decreased balance, difficulty walking, decreased ROM, decreased strength, increased edema, increased fascial restrictions, and pain.    GOALS: Goals reviewed with patient? Yes   SHORT TERM GOALS: Target date: 08/03/2022    Independent with initial HEP. Baseline: Has HEP from [facility]-- PT progressed today. Goal status: INITIAL 1/25: MET  2.  The patient will improve AROM R knee to 104 degrees flexion.  Baseline: 85 degrees Goal status: INITIAL 1/25: IN PROGRESS (89 degrees)  3.  The patient will improve AROM R knee to -10 degrees during LAQ. Baseline: -18 degrees Goal status: INITIAL 1/25: MET (-10 degrees)   LONG TERM GOALS: Target date: 08/31/2022   Independent with advanced/ongoing HEP to improve outcomes and carryover.  Baseline: has initial HEP Goal status: INITIAL  2.  Patient will demonstrate right knee flexion to 115 deg to ascend/descend stairs. Baseline: 85 degrees AROM Goal status: INITIAL  3.  Patient will demonstrate full right knee extension for  safety with gait. Baseline: -18 during LAQ Goal status: INITIAL  4.  Patient will be able to ambulate 500' safely with LRAD and normal gait pattern to access community.  Baseline: 100 + ft with RW mod indep Goal status: INITIAL  5.  Patient will be able to ascend/descend stairs with 1 HR and reciprocal step pattern safely to access home and community.  Baseline: step to pattern with bilat HRs Goal status: INITIAL  7.  Patient will demonstrate improved functional LE strength by completing 5x STS in <17 seconds.  (MCID 5 seconds) Baseline: 22.18 seconds Goal status: INITIAL  PLAN:  PT FREQUENCY: 2x/week  PT DURATION: 8  weeks  PLANNED INTERVENTIONS: Therapeutic exercises, Therapeutic activity, Neuromuscular re-education, Balance training, Gait training, Patient/Family education, Self Care, Joint mobilization, Stair training, DME instructions, Aquatic Therapy, Dry Needling, Electrical stimulation, Cryotherapy, Moist heat, scar mobilization, Taping, Vasopneumatic device, and Manual therapy  PLAN FOR NEXT SESSION: progress HEP, R knee AROM/PROM, gait training reducing reliance on RW, pain mgmt and scar tissue massage when indicated.   Hardin Negus, PTA 08/02/2022, 5:10 PM OUTPATIENT PHYSICAL THERAPY LOWER EXTREMITY TREATMENT   Patient Name: Barbara Calderon MRN: [id number] DOB:[birth year], 57 y.o., female Today's Date: 08/02/2022  END OF SESSION:  PT End of Session - 08/02/22 1624     Visit Number 7    Number of Visits 16    Date for PT Re-Evaluation 09/04/22    Authorization Type UHC    PT Start Time 1620    PT Stop Time 1709    PT Time Calculation (min) 49 min              Past Medical History:  Diagnosis Date   Abnormal mammogram    Abnormal Pap smear of cervix    Anxiety    Arthritis    Asthma    Depression    GERD (gastroesophageal reflux disease)    Hashimoto's disease    Heart murmur    Echo was good per pt.   Hyperlipidemia    borderline- no meds   Hypothyroidism    Lymphedema    Lymphedema    bilateral legs   Lymphedema    legs   Neuromuscular disorder (HCC)    fibro many years ago told this, neurpathy legs   Pneumonia    PONV (postoperative nausea and vomiting)    slow to wake up   Sleep apnea    mild no cpap   Thyroid disease    Hashimotos   Varicose veins    Past Surgical History:  Procedure Laterality Date   ABDOMINAL HYSTERECTOMY     partial   APPLICATION OF WOUND VAC     on left lower leg above foot   BLADDER SUSPENSION     CESAREAN SECTION     x2   CHOLECYSTECTOMY OPEN     INCISION AND DRAINAGE OF WOUND     PARTIAL HYSTERECTOMY     SKIN GRAFT      TOTAL KNEE ARTHROPLASTY Right 06/22/2022   Procedure: RIGHT TOTAL KNEE ARTHROPLASTY;  Surgeon: Mcarthur Rossetti, MD;  Location: [facility];  Service: Orthopedics;  Laterality: Right;   TUBAL LIGATION     Patient Active Problem List   Diagnosis Date Noted   Lumbar spondylosis 07/18/2022   Status post total right knee replacement 06/22/2022   Preoperative clearance 04/11/2022   Primary osteoarthritis of left ankle 12/12/2021   Abnormal finding on MRI of brain 08/03/2021   Chronic intractable headache 08/03/2021  Mild sleep apnea 04/15/2019   Chronic venous insufficiency 11/12/2014   Family history of DVT 11/12/2014   Lymphedema 11/12/2014   Vaginal vault prolapse 11/06/2013   SUI (stress urinary incontinence, female) 08/03/2013   Obesity, morbid, BMI 40.0-49.9 ([place]) 08/03/2013   Cystocele 08/03/2013   Asthma 09/20/2011   Depression 08/22/2011   History of gastroesophageal reflux (GERD) 08/22/2011   SHINGLES 08/08/2007   Hypothyroidism 05/22/2007   POLYARTHRALGIA 05/22/2007   LEG EDEMA 05/22/2007   URINARY FREQUENCY 05/22/2007    PCP: Samuel Bouche, NP  REFERRING PROVIDER: Jean Rosenthal, MD  REFERRING DIAG: s/p R TKR  THERAPY DIAG:  Muscle weakness (generalized)  Acute pain of right knee  Other abnormalities of gait and mobility  Localized edema  Rationale for Evaluation and Treatment: Rehabilitation  ONSET DATE: 06/22/2022  SUBJECTIVE:   SUBJECTIVE STATEMENT: Pt is feeling okay. Pt notes she did wake up last night due to pain.   PERTINENT HISTORY: GERD, HLD, lymphedema, fibromyalgia, h/o imbalance (vision, hearing changes)-- MS ruled out. H/o migraines. PAIN:  Are you having pain? Yes: NPRS scale: 1/10 Pain location: R knee Pain description: much improved since eval Aggravating factors: ROM in therapy Relieving factors: meds  6/10 rib pain, eases with arms still  10/10 on L knee with palpation  PRECAUTIONS: None  WEIGHT BEARING  RESTRICTIONS: No  FALLS:  Has patient fallen in last 6 months? Yes. Number of falls estimates 6  LIVING ENVIRONMENT: Lives with: lives with their family *She is working to adopt a 58 year old and is primary caregiver for 58year old. Lives in: House/apartment Stairs: Yes: External: 4 steps; has rail Has following equipment at home: Gilford Rile - 2 wheeled; used a cane in the past  OCCUPATION: retired Scientific laboratory technician; on disability due to problems with lymphedema  PLOF: Independent  PATIENT GOALS: Improve mobility and stability. "I want to be able to walk."  NEXT MD VISIT:  in 4 weeks  OBJECTIVE:   PATIENT SURVEYS:  FOTO 52% (goal to 77%)   SENSATION: Some h/o abnormal sensation in her L toes (may be from prior meds)  EDEMA:  Has a h/o lymphedema Circumferential measurements: L 19.5" and R 21.0" Incision is well approximated and clean/dry with steri-strips intact  POSTURE: Maintains mild R knee flexion in standing  PALPATION: Tenderness over the lateral distal aspect of the knee  LOWER EXTREMITY ROM:  AROM Right eval Left eval Right 07/31/22  Knee flexion 85  98  Knee extension -18  -9                    PROM     Knee flexion 92    Knee extension -12     LOWER EXTREMITY MMT: >3/5 throughout LEs   FUNCTIONAL TESTS:  5 times sit to stand: 22.18 seconds without UEs  Gait speed=20 ft/8.08 seconds=2.48 ft/sec  GAIT: Distance walked: 100 Assistive device utilized: Environmental consultant - 2 wheeled Level of assistance: Modified independence Comments: R knee maintains mild flexion with foot flat Stairs: mod indep step to pattern with bilat handrails x 4 steps  [facility] Adult PT Treatment:                                                DATE: 07/31/22 Therapeutic Exercise: Recumbent bike bwd x 3 min, fwd x 3 min full revolutions Sitting Knee flexion  self stretch 2x30 sec Hamstring stretch 2x30 sec Quad set 2x10x3 sec SLR x10, with ER x10 Standing Foot tap on 4" step  2x10 Forward step up 2x10 Side step up 2x10 R&L Wall squat 2x10 Gait: 75'x3 laps with SPC SBA. Cues for heel strike and cane negotiation Backwards walking 2x15' with cane SBA   [facility] Adult PT Treatment:                                                DATE: 07/26/2022 Therapeutic Exercise: Recumbent bike x 5 min half revolutions Gastroc stretch with strap 2x30 Hamstring stretch with strap 2 x30 sec Supine knee ext with ankle on bolster + 5 # weight for passive stretch x 2 min Supine knee ext with ankle on bolster quad set 10x5 sec Supine SAQ 2# AW 2x10x3" Prone quad stretch with strap 2x30 sec Prone hamstring curl 2# AW 2x10x3" Prone hip ext 2# AW 2x10 S/L hip abd 2# AW 2x10 Supine ITB stretch with strap x30 sec Manual Therapy and Self Care: Scar massage     PATIENT EDUCATION:  Education details: HEP Person educated: Patient Education method: Explanation, Demonstration, and Handouts Education comprehension: verbalized understanding and returned demonstration  HOME EXERCISE PROGRAM: Access Code: [email or web address] URL: [email or web address] Date: 07/19/2022 Prepared by: Helane Gunther  Exercises - Quad Setting and Stretching  - 2 x daily - 7 x weekly - 1 sets - 10 reps - Supine Active Straight Leg Raise  - 1 x daily - 7 x weekly - 3 sets - 15 reps - Supine Heel Slides  - 1 x daily - 7 x weekly - 3 sets - 15 reps - Sidelying Hip Abduction  - 2 x daily - 7 x weekly - 1 sets - 10 reps - Seated Long Arc Quad  - 1 x daily - 7 x weekly - 3 sets - 10 reps - Seated Knee Flexion Stretch  - 1 x daily - 7 x weekly - 3 sets - 10 reps - Long Sitting Ankle Pumps  - 1 x daily - 7 x weekly - 3 sets - 10 reps - Seated Pelvic Floor Contraction with Isometric Hip Adduction  - 1 x daily - 7 x weekly - 3 sets - 10 reps - 5 sec hold - Seated Pelvic Floor Contraction with Hip Abduction and Resistance Loop  - 1 x daily - 7 x weekly - 3 sets - 10 reps - Gastroc Stretch with Foot at Wall  - 1 x  daily - 7 x weekly - 2 sets - 30 sec hold - Standing Soleus Stretch on Step  - 1 x daily - 7 x weekly - 2 sets - 30 sec hold - Supine Quadriceps Stretch with Strap on Table  - 1 x daily - 7 x weekly - 2 sets - 30 sec hold - Prone Terminal Knee Extension  - 1 x daily - 7 x weekly - 3 sets - 10 reps - Prone Knee Flexion  - 1 x daily - 7 x weekly - 3 sets - 10 reps - Standing Balance in Corner with Eyes Closed  - 1 x daily - 7 x weekly - 1 sets - 5 reps - 30 sec hold  ASSESSMENT:  CLINICAL IMPRESSION: Worked on improving standing stability and strengthening on stairs this session. Able to transition to ambulating with cane this  session.   OBJECTIVE IMPAIRMENTS: Abnormal gait, decreased activity tolerance, decreased balance, difficulty walking, decreased ROM, decreased strength, increased edema, increased fascial restrictions, and pain.    GOALS: Goals reviewed with patient? Yes   SHORT TERM GOALS: Target date: 08/03/2022    Independent with initial HEP. Baseline: Has HEP from [facility]-- PT progressed today. Goal status: INITIAL  2.  The patient will improve AROM R knee to 104 degrees flexion.  Baseline: 85 degrees Goal status: INITIAL  3.  The patient will improve AROM R knee to -10 degrees during LAQ. Baseline: -18 degrees Goal status: INITIAL   LONG TERM GOALS: Target date: 08/31/2022   Independent with advanced/ongoing HEP to improve outcomes and carryover.  Baseline: has initial HEP Goal status: INITIAL  2.  Patient will demonstrate right knee flexion to 115 deg to ascend/descend stairs. Baseline: 85 degrees AROM Goal status: INITIAL  3.  Patient will demonstrate full right knee extension for safety with gait. Baseline: -18 during LAQ Goal status: INITIAL  4.  Patient will be able to ambulate 500' safely with LRAD and normal gait pattern to access community.  Baseline: 100 + ft with RW mod indep Goal status: INITIAL  5.  Patient will be able to ascend/descend stairs with 1  HR and reciprocal step pattern safely to access home and community.  Baseline: step to pattern with bilat HRs Goal status: INITIAL  7.  Patient will demonstrate improved functional LE strength by completing 5x STS in <17 seconds.  (MCID 5 seconds) Baseline: 22.18 seconds Goal status: INITIAL  PLAN:  PT FREQUENCY: 2x/week  PT DURATION: 8 weeks  PLANNED INTERVENTIONS: Therapeutic exercises, Therapeutic activity, Neuromuscular re-education, Balance training, Gait training, Patient/Family education, Self Care, Joint mobilization, Stair training, DME instructions, Aquatic Therapy, Dry Needling, Electrical stimulation, Cryotherapy, Moist heat, scar mobilization, Taping, Vasopneumatic device, and Manual therapy  PLAN FOR NEXT SESSION: progress HEP, R knee AROM/PROM, gait training reducing reliance on RW, pain mgmt and scar tissue massage when indicated.   Hardin Negus, PTA 08/02/2022, 5:10 PM

## 2022-08-07 ENCOUNTER — Ambulatory Visit: Payer: Medicare HMO | Admitting: Physical Therapy

## 2022-08-07 ENCOUNTER — Encounter: Payer: Self-pay | Admitting: Physical Therapy

## 2022-08-07 DIAGNOSIS — R600 Localized edema: Secondary | ICD-10-CM

## 2022-08-07 DIAGNOSIS — R2689 Other abnormalities of gait and mobility: Secondary | ICD-10-CM | POA: Diagnosis not present

## 2022-08-07 DIAGNOSIS — M6281 Muscle weakness (generalized): Secondary | ICD-10-CM

## 2022-08-07 DIAGNOSIS — M25561 Pain in right knee: Secondary | ICD-10-CM

## 2022-08-07 NOTE — Therapy (Signed)
OUTPATIENT PHYSICAL THERAPY LOWER EXTREMITY TREATMENT   Patient Name: Barbara Calderon MRN: [id number] DOB:June 09, 1966, 57 y.o., female Today's Date: 08/07/2022  END OF SESSION:  PT End of Session - 08/07/22 0858     Visit Number 8    Number of Visits 16    Date for PT Re-Evaluation 09/04/22    Authorization Type U[employer]    PT Start Time [phone]   late arrival   PT Stop Time 0930    PT Time Calculation (min) 35 min    Activity Tolerance Patient tolerated treatment well    Behavior During Therapy WFL for tasks assessed/performed               Past Medical History:  Diagnosis Date   Abnormal mammogram    Abnormal Pap smear of cervix    Anxiety    Arthritis    Asthma    Depression    GERD (gastroesophageal reflux disease)    Hashimoto's disease    Heart murmur    Echo was good per pt.   Hyperlipidemia    borderline- no meds   Hypothyroidism    Lymphedema    Lymphedema    bilateral legs   Lymphedema    legs   Neuromuscular disorder (HCC)    fibro many years ago told this, neurpathy legs   Pneumonia    PONV (postoperative nausea and vomiting)    slow to wake up   Sleep apnea    mild no cpap   Thyroid disease    Hashimotos   Varicose veins    Past Surgical History:  Procedure Laterality Date   ABDOMINAL HYSTERECTOMY     partial   APPLICATION OF WOUND VAC     on left lower leg above foot   BLADDER SUSPENSION     CESAREAN SECTION     x2   CHOLECYSTECTOMY OPEN     INCISION AND DRAINAGE OF WOUND     PARTIAL HYSTERECTOMY     SKIN GRAFT     TOTAL KNEE ARTHROPLASTY Right 06/22/2022   Procedure: RIGHT TOTAL KNEE ARTHROPLASTY;  Surgeon: Mcarthur Rossetti, MD;  Location: [facility];  Service: Orthopedics;  Laterality: Right;   TUBAL LIGATION     Patient Active Problem List   Diagnosis Date Noted   Lumbar spondylosis 07/18/2022   Status post total right knee replacement 06/22/2022   Preoperative clearance 04/11/2022   Primary osteoarthritis of left ankle  12/12/2021   Abnormal finding on MRI of brain 08/03/2021   Chronic intractable headache 08/03/2021   Mild sleep apnea 04/15/2019   Chronic venous insufficiency 11/12/2014   Family history of DVT 11/12/2014   Lymphedema 11/12/2014   Vaginal vault prolapse 11/06/2013   SUI (stress urinary incontinence, female) 08/03/2013   Obesity, morbid, BMI 40.0-49.9 ([place]) 08/03/2013   Cystocele 08/03/2013   Asthma 09/20/2011   Depression 08/22/2011   History of gastroesophageal reflux (GERD) 08/22/2011   SHINGLES 08/08/2007   Hypothyroidism 05/22/2007   POLYARTHRALGIA 05/22/2007   LEG EDEMA 05/22/2007   URINARY FREQUENCY 05/22/2007    PCP: Samuel Bouche, NP  REFERRING PROVIDER: Jean Rosenthal, MD  REFERRING DIAG: s/p R TKR  THERAPY DIAG:  Muscle weakness (generalized)  Acute pain of right knee  Other abnormalities of gait and mobility  Localized edema  Rationale for Evaluation and Treatment: Rehabilitation  ONSET DATE: 06/22/2022  SUBJECTIVE:   SUBJECTIVE STATEMENT: Pt states she had a lot of leg cramping at night. Had a hard time sleeping. Has been cleared  to drive with Dr. Magnus Ivan.   PERTINENT HISTORY: GERD, HLD, lymphedema, fibromyalgia, h/o imbalance (vision, hearing changes)-- MS ruled out. H/o migraines. PAIN:  Are you having pain? Yes: NPRS scale: 0/10 Pain location: R knee Pain description: much improved since eval Aggravating factors: ROM in therapy Relieving factors: meds  6/10 rib pain, eases with arms still  10/10 on L knee with palpation  PRECAUTIONS: None  WEIGHT BEARING RESTRICTIONS: No  FALLS:  Has patient fallen in last 6 months? Yes. Number of falls estimates 6  LIVING ENVIRONMENT: Lives with: lives with their family *She is working to adopt a 58 year old and is primary caregiver for 58year old. Lives in: House/apartment Stairs: Yes: External: 4 steps; has rail Has following equipment at home: Dan Humphreys - 2 wheeled; used a cane in the  past  OCCUPATION: retired Arts administrator; on disability due to problems with lymphedema  PLOF: Independent  PATIENT GOALS: Improve mobility and stability. "I want to be able to walk."  NEXT MD VISIT:  in 4 weeks  OBJECTIVE:   PATIENT SURVEYS:  FOTO 52% (goal to 77%) 08/02/22:    SENSATION: Some h/o abnormal sensation in her L toes (may be from prior meds)  EDEMA:  Has a h/o lymphedema Circumferential measurements: L 19.5" and R 21.0" Incision is well approximated and clean/dry with steri-strips intact  POSTURE: Maintains mild R knee flexion in standing  PALPATION: Tenderness over the lateral distal aspect of the knee  LOWER EXTREMITY ROM:  AROM Right eval Left eval Right 07/31/22 Right 08/07/22  Knee flexion 85  98 105  Knee extension -18  -9 -8                    PROM      Knee flexion 92   110  Knee extension -12      LOWER EXTREMITY MMT: >3/5 throughout LEs   FUNCTIONAL TESTS:  5 times sit to stand: 22.18 seconds without UEs  Gait speed=20 ft/8.08 seconds=2.48 ft/sec  GAIT: Distance walked: 100 Assistive device utilized: Environmental consultant - 2 wheeled Level of assistance: Modified independence Comments: R knee maintains mild flexion with foot flat Stairs: mod indep step to pattern with bilat handrails x 4 steps  [facility] Adult PT Treatment:                                                DATE: 08/07/22 Therapeutic Exercise: Recumbent bike L1 x 5 min, bwds/fwds Sitting Hamstring stretch x30 sec Knee ext with overpressure x 45 sec Knee flexion self stretch 2x30 sec Standing Wall squat 2x10 Modified dead lifting 2x10 with 10# KB Side stepping red TB around knees 2x15' Backwards stepping red TB around knees 2x15' Gait: Forwards walking no a/d, SBA, cues for glute activation to decrease trendelenburg pattern on R   [facility] Adult PT Treatment:                                                DATE: 08/02/2022 Therapeutic Exercise: NuStep L6 x (gradually moving  seat closer) Sitting Knee flexion self stretch 2x30 sec Hamstring stretch 2x30 sec SLR x10, with ER 2x10 SAQ 2x10x3 sec R heel slide with strap 10x5" Standing Foot tap  on 6" step  + forward weight shifts x15 4" stair Forward step up 2x10 4" step Side step up 2x10 R&L Wall squat 2x10 Side stepping YTB Fwd zig-zag stepping YTB Fwd amb with focus on heel strike (SPC) Bkwd amb [facility])    PATIENT EDUCATION:  Education details: HEP Person educated: Patient Education method: Consulting civil engineer, Media planner, and Handouts Education comprehension: verbalized understanding and returned demonstration  HOME EXERCISE PROGRAM: Access Code: [email or web address] URL: [email or web address] Date: 07/19/2022 Prepared by: Helane Gunther  Exercises - Quad Setting and Stretching  - 2 x daily - 7 x weekly - 1 sets - 10 reps - Supine Active Straight Leg Raise  - 1 x daily - 7 x weekly - 3 sets - 15 reps - Supine Heel Slides  - 1 x daily - 7 x weekly - 3 sets - 15 reps - Sidelying Hip Abduction  - 2 x daily - 7 x weekly - 1 sets - 10 reps - Seated Long Arc Quad  - 1 x daily - 7 x weekly - 3 sets - 10 reps - Seated Knee Flexion Stretch  - 1 x daily - 7 x weekly - 3 sets - 10 reps - Long Sitting Ankle Pumps  - 1 x daily - 7 x weekly - 3 sets - 10 reps - Seated Pelvic Floor Contraction with Isometric Hip Adduction  - 1 x daily - 7 x weekly - 3 sets - 10 reps - 5 sec hold - Seated Pelvic Floor Contraction with Hip Abduction and Resistance Loop  - 1 x daily - 7 x weekly - 3 sets - 10 reps - Gastroc Stretch with Foot at Wall  - 1 x daily - 7 x weekly - 2 sets - 30 sec hold - Standing Soleus Stretch on Step  - 1 x daily - 7 x weekly - 2 sets - 30 sec hold - Supine Quadriceps Stretch with Strap on Table  - 1 x daily - 7 x weekly - 2 sets - 30 sec hold - Prone Terminal Knee Extension  - 1 x daily - 7 x weekly - 3 sets - 10 reps - Prone Knee Flexion  - 1 x daily - 7 x weekly - 3 sets - 10 reps - Standing Balance  in Corner with Eyes Closed  - 1 x daily - 7 x weekly - 1 sets - 5 reps - 30 sec hold  ASSESSMENT:  CLINICAL IMPRESSION: Pt continues to improve her knee ROM. Passively able to get to 110 deg but actively is only ~105 deg. Working on dynamic gait and weight shifting this session. Minor LOBs noted with backwards stepping when required to stabilize on R LE.   OBJECTIVE IMPAIRMENTS: Abnormal gait, decreased activity tolerance, decreased balance, difficulty walking, decreased ROM, decreased strength, increased edema, increased fascial restrictions, and pain.    GOALS: Goals reviewed with patient? Yes   SHORT TERM GOALS: Target date: 08/03/2022    Independent with initial HEP. Baseline: Has HEP from [facility]-- PT progressed today. Goal status: INITIAL 1/25: MET  2.  The patient will improve AROM R knee to 104 degrees flexion.  Baseline: 85 degrees Goal status: INITIAL 1/25: IN PROGRESS (89 degrees)  3.  The patient will improve AROM R knee to -10 degrees during LAQ. Baseline: -18 degrees Goal status: INITIAL 1/25: MET (-10 degrees)   LONG TERM GOALS: Target date: 08/31/2022   Independent with advanced/ongoing HEP to improve outcomes and carryover.  Baseline: has initial HEP Goal status: INITIAL  2.  Patient will demonstrate right knee flexion to 115 deg to ascend/descend stairs. Baseline: 85 degrees AROM Goal status: INITIAL  3.  Patient will demonstrate full right knee extension for safety with gait. Baseline: -18 during LAQ Goal status: INITIAL  4.  Patient will be able to ambulate 500' safely with LRAD and normal gait pattern to access community.  Baseline: 100 + ft with RW mod indep Goal status: INITIAL  5.  Patient will be able to ascend/descend stairs with 1 HR and reciprocal step pattern safely to access home and community.  Baseline: step to pattern with bilat HRs Goal status: INITIAL  7.  Patient will demonstrate improved functional LE strength by completing 5x STS in  <17 seconds.  (MCID 5 seconds) Baseline: 22.18 seconds Goal status: INITIAL  PLAN:  PT FREQUENCY: 2x/week  PT DURATION: 8 weeks  PLANNED INTERVENTIONS: Therapeutic exercises, Therapeutic activity, Neuromuscular re-education, Balance training, Gait training, Patient/Family education, Self Care, Joint mobilization, Stair training, DME instructions, Aquatic Therapy, Dry Needling, Electrical stimulation, Cryotherapy, Moist heat, scar mobilization, Taping, Vasopneumatic device, and Manual therapy  PLAN FOR NEXT SESSION: progress HEP, R knee AROM/PROM, gait training reducing reliance on RW, pain mgmt and scar tissue massage when indicated.   Jenie Parish April Ma L Kadedra Vanaken, PT 08/07/2022, 9:29 AM

## 2022-08-09 ENCOUNTER — Ambulatory Visit: Payer: Medicare HMO | Attending: Orthopaedic Surgery

## 2022-08-09 DIAGNOSIS — M25561 Pain in right knee: Secondary | ICD-10-CM | POA: Diagnosis not present

## 2022-08-09 DIAGNOSIS — R600 Localized edema: Secondary | ICD-10-CM | POA: Diagnosis not present

## 2022-08-09 DIAGNOSIS — R2689 Other abnormalities of gait and mobility: Secondary | ICD-10-CM | POA: Insufficient documentation

## 2022-08-09 DIAGNOSIS — M6281 Muscle weakness (generalized): Secondary | ICD-10-CM | POA: Insufficient documentation

## 2022-08-09 NOTE — Therapy (Signed)
OUTPATIENT PHYSICAL THERAPY LOWER EXTREMITY TREATMENT   Patient Name: Barbara Calderon MRN: [id number] DOB:1966-07-08, 57 y.o., female Today's Date: 08/09/2022  END OF SESSION:  PT End of Session - 08/09/22 1531     Visit Number 9    Number of Visits 16    Date for PT Re-Evaluation 09/04/22    Authorization Type UHC    PT Start Time 1531    PT Stop Time 1616    PT Time Calculation (min) 45 min    Activity Tolerance Patient tolerated treatment well    Behavior During Therapy WFL for tasks assessed/performed               Past Medical History:  Diagnosis Date   Abnormal mammogram    Abnormal Pap smear of cervix    Anxiety    Arthritis    Asthma    Depression    GERD (gastroesophageal reflux disease)    Hashimoto's disease    Heart murmur    Echo was good per pt.   Hyperlipidemia    borderline- no meds   Hypothyroidism    Lymphedema    Lymphedema    bilateral legs   Lymphedema    legs   Neuromuscular disorder (HCC)    fibro many years ago told this, neurpathy legs   Pneumonia    PONV (postoperative nausea and vomiting)    slow to wake up   Sleep apnea    mild no cpap   Thyroid disease    Hashimotos   Varicose veins    Past Surgical History:  Procedure Laterality Date   ABDOMINAL HYSTERECTOMY     partial   APPLICATION OF WOUND VAC     on left lower leg above foot   BLADDER SUSPENSION     CESAREAN SECTION     x2   CHOLECYSTECTOMY OPEN     INCISION AND DRAINAGE OF WOUND     PARTIAL HYSTERECTOMY     SKIN GRAFT     TOTAL KNEE ARTHROPLASTY Right 06/22/2022   Procedure: RIGHT TOTAL KNEE ARTHROPLASTY;  Surgeon: [name], MD;  Location: [facility];  Service: Orthopedics;  Laterality: Right;   TUBAL LIGATION     Patient Active Problem List   Diagnosis Date Noted   Lumbar spondylosis 07/18/2022   Status post total right knee replacement 06/22/2022   Preoperative clearance 04/11/2022   Primary osteoarthritis of left ankle 12/12/2021    Abnormal finding on MRI of brain 08/03/2021   Chronic intractable headache 08/03/2021   Mild sleep apnea 04/15/2019   Chronic venous insufficiency 11/12/2014   Family history of DVT 11/12/2014   Lymphedema 11/12/2014   Vaginal vault prolapse 11/06/2013   SUI (stress urinary incontinence, female) 08/03/2013   Obesity, morbid, BMI 40.0-49.9 (HCC) 08/03/2013   Cystocele 08/03/2013   Asthma 09/20/2011   Depression 08/22/2011   History of gastroesophageal reflux (GERD) 08/22/2011   SHINGLES 08/08/2007   Hypothyroidism 05/22/2007   POLYARTHRALGIA 05/22/2007   LEG EDEMA 05/22/2007   URINARY FREQUENCY 05/22/2007    PCP: Christen Butter, NP  REFERRING PROVIDER: Doneen Poisson, MD  REFERRING DIAG: s/p R TKR  THERAPY DIAG:  Muscle weakness (generalized)  Acute pain of right knee  Other abnormalities of gait and mobility  Localized edema  Rationale for Evaluation and Treatment: Rehabilitation  ONSET DATE: 06/22/2022  SUBJECTIVE:   SUBJECTIVE STATEMENT: Patient reports she has been cleaning her house and mopping the kitchen today, states states her knees feel fine but her L  lateral ankle is bothering her. Patient states she is cautious and moves slowly to maintain safe balance. Patient states she continues to   PERTINENT HISTORY: GERD, HLD, lymphedema, fibromyalgia, h/o imbalance (vision, hearing changes)-- MS ruled out. H/o migraines. PAIN:  Are you having pain? Yes: NPRS scale: 0/10 Pain location: R knee Pain description: much improved since eval Aggravating factors: ROM in therapy Relieving factors: meds  6/10 rib pain, eases with arms still  10/10 on L knee with palpation  PRECAUTIONS: None  WEIGHT BEARING RESTRICTIONS: No  FALLS:  Has patient fallen in last 6 months? Yes. Number of falls estimates 6  LIVING ENVIRONMENT: Lives with: lives with their family *She is working to adopt a 58 year old and is primary caregiver for 58year old. Lives in:  House/apartment Stairs: Yes: External: 4 steps; has rail Has following equipment at home: Gilford Rile - 2 wheeled; used a cane in the past  OCCUPATION: retired Scientific laboratory technician; on disability due to problems with lymphedema  PLOF: Independent  PATIENT GOALS: Improve mobility and stability. "I want to be able to walk."  NEXT MD VISIT:  in 4 weeks  OBJECTIVE:   PATIENT SURVEYS:  FOTO 52% (goal to 77%) 08/02/22:    SENSATION: Some h/o abnormal sensation in her L toes (may be from prior meds)  EDEMA:  Has a h/o lymphedema Circumferential measurements: L 19.5" and R 21.0" Incision is well approximated and clean/dry with steri-strips intact  POSTURE: Maintains mild R knee flexion in standing  PALPATION: Tenderness over the lateral distal aspect of the knee  LOWER EXTREMITY ROM:  AROM Right eval Left eval Right 07/31/22 Right 08/07/22  Knee flexion 85  98 105  Knee extension -18  -9 -8                    PROM      Knee flexion 92   110  Knee extension -12      LOWER EXTREMITY MMT: >3/5 throughout LEs   FUNCTIONAL TESTS:  5 times sit to stand: 22.18 seconds without UEs  Gait speed=20 ft/8.08 seconds=2.48 ft/sec  GAIT: Distance walked: 100 Assistive device utilized: Environmental consultant - 2 wheeled Level of assistance: Modified independence Comments: R knee maintains mild flexion with foot flat Stairs: mod indep step to pattern with bilat handrails x 4 steps   [facility] Adult PT Treatment:                                                DATE: 08/09/2022 Therapeutic Exercise: NuStep L6 x 5 min Seated L ankle inversion Supine hip add & abd isometrics 10x5" Bent knee fall out GTB 2x10 Supine HS & ITB stretches w/strap 2x30" B Seated flexion self stretch 2x30" B 4" step forward SL weight shifting x10 B 4" lateral step down x10 B Resisted side stepping RTB --> GTB (counter) Standing HS curls --> prone HS curls YTB STS 2x5 hands braced on thighs   [facility] Adult PT Treatment:                                                 DATE: 08/07/22 Therapeutic Exercise: Recumbent bike L1 x 5 min, bwds/fwds Sitting Hamstring stretch x30 sec Knee  ext with overpressure x 45 sec Knee flexion self stretch 2x30 sec Standing Wall squat 2x10 Modified dead lifting 2x10 with 10# KB Side stepping red TB around knees 2x15' Backwards stepping red TB around knees 2x15' Gait: Forwards walking no a/d, SBA, cues for glute activation to decrease trendelenburg pattern on R     PATIENT EDUCATION:  Education details: HEP Person educated: Patient Education method: Explanation, Demonstration, and Handouts Education comprehension: verbalized understanding and returned demonstration  HOME EXERCISE PROGRAM: Access Code: [email or web address] URL: [email or web address] Date: 08/09/2022 Prepared by: Helane Gunther  Exercises - Quad Setting and Stretching  - 2 x daily - 7 x weekly - 1 sets - 10 reps - Supine Active Straight Leg Raise  - 1 x daily - 7 x weekly - 3 sets - 15 reps - Supine Heel Slides  - 1 x daily - 7 x weekly - 3 sets - 15 reps - Sidelying Hip Abduction  - 2 x daily - 7 x weekly - 1 sets - 10 reps - Seated Long Arc Quad  - 1 x daily - 7 x weekly - 3 sets - 10 reps - Seated Knee Flexion Stretch  - 1 x daily - 7 x weekly - 3 sets - 10 reps - Long Sitting Ankle Pumps  - 1 x daily - 7 x weekly - 3 sets - 10 reps - Seated Pelvic Floor Contraction with Isometric Hip Adduction  - 1 x daily - 7 x weekly - 3 sets - 10 reps - 5 sec hold - Seated Pelvic Floor Contraction with Hip Abduction and Resistance Loop  - 1 x daily - 7 x weekly - 3 sets - 10 reps - Gastroc Stretch with Foot at Wall  - 1 x daily - 7 x weekly - 2 sets - 30 sec hold - Standing Soleus Stretch on Step  - 1 x daily - 7 x weekly - 2 sets - 30 sec hold - Supine Quadriceps Stretch with Strap on Table  - 1 x daily - 7 x weekly - 2 sets - 30 sec hold - Prone Terminal Knee Extension  - 1 x daily - 7 x weekly - 3 sets - 10 reps -  Prone Knee Flexion  - 1 x daily - 7 x weekly - 3 sets - 10 reps - Standing Balance in Corner with Eyes Closed  - 1 x daily - 7 x weekly - 1 sets - 5 reps - 30 sec hold - Seated Knee Extension with Resistance  - 1 x daily - 7 x weekly - 3 sets - 10 reps - Seated Hamstring Curls with Resistance  - 1 x daily - 7 x weekly - 3 sets - 10 reps - Sit to Stand  - 1 x daily - 7 x weekly - 3 sets - 10 reps  Patient Education - Scar Massage  ASSESSMENT:  CLINICAL IMPRESSION: Cueing improved weight shifting mechanics during stair progression exercise and decreased knee discomfort. Hamstring strengthening progressed with light resistance added during prone curls.    OBJECTIVE IMPAIRMENTS: Abnormal gait, decreased activity tolerance, decreased balance, difficulty walking, decreased ROM, decreased strength, increased edema, increased fascial restrictions, and pain.    GOALS: Goals reviewed with patient? Yes   SHORT TERM GOALS: Target date: 08/03/2022    Independent with initial HEP. Baseline: Has HEP from [facility]-- PT progressed today. Goal status: INITIAL 1/25: MET  2.  The patient will improve AROM R knee to 104 degrees flexion.  Baseline: 85 degrees  Goal status: INITIAL 1/25: IN PROGRESS (89 degrees)  3.  The patient will improve AROM R knee to -10 degrees during LAQ. Baseline: -18 degrees Goal status: INITIAL 1/25: MET (-10 degrees)   LONG TERM GOALS: Target date: 08/31/2022   Independent with advanced/ongoing HEP to improve outcomes and carryover.  Baseline: has initial HEP Goal status: INITIAL  2.  Patient will demonstrate right knee flexion to 115 deg to ascend/descend stairs. Baseline: 85 degrees AROM Goal status: INITIAL  3.  Patient will demonstrate full right knee extension for safety with gait. Baseline: -18 during LAQ Goal status: INITIAL  4.  Patient will be able to ambulate 500' safely with LRAD and normal gait pattern to access community.  Baseline: 100 + ft with RW mod  indep Goal status: INITIAL  5.  Patient will be able to ascend/descend stairs with 1 HR and reciprocal step pattern safely to access home and community.  Baseline: step to pattern with bilat HRs Goal status: INITIAL  7.  Patient will demonstrate improved functional LE strength by completing 5x STS in <17 seconds.  (MCID 5 seconds) Baseline: 22.18 seconds Goal status: INITIAL  PLAN:  PT FREQUENCY: 2x/week  PT DURATION: 8 weeks  PLANNED INTERVENTIONS: Therapeutic exercises, Therapeutic activity, Neuromuscular re-education, Balance training, Gait training, Patient/Family education, Self Care, Joint mobilization, Stair training, DME instructions, Aquatic Therapy, Dry Needling, Electrical stimulation, Cryotherapy, Moist heat, scar mobilization, Taping, Vasopneumatic device, and Manual therapy  PLAN FOR NEXT SESSION: Knee AROM/PROM, gait training reducing reliance on RW, pain mgmt and scar tissue massage when indicated.   Hardin Negus, PTA 08/09/2022, 4:18 PM

## 2022-08-10 ENCOUNTER — Other Ambulatory Visit: Payer: Self-pay | Admitting: Orthopaedic Surgery

## 2022-08-10 ENCOUNTER — Ambulatory Visit (INDEPENDENT_AMBULATORY_CARE_PROVIDER_SITE_OTHER): Payer: Medicare HMO | Admitting: Medical-Surgical

## 2022-08-10 DIAGNOSIS — Z0000 Encounter for general adult medical examination without abnormal findings: Secondary | ICD-10-CM | POA: Diagnosis not present

## 2022-08-10 MED ORDER — TRAMADOL HCL 50 MG PO TABS: 50.0000 mg | ORAL_TABLET | Freq: Four times a day (QID) | ORAL | 0 refills | Status: DC | PRN

## 2022-08-10 NOTE — Patient Instructions (Signed)
[facility] Maintenance Summary and Written Plan of Care  Ms. Barbara Calderon ,  Thank you for allowing me to perform your Medicare Annual Wellness Visit and for your ongoing commitment to your health.   Health Maintenance & Immunization History Health Maintenance  Topic Date Due   DTaP/Tdap/Td (2 - Td or Tdap) 07/09/2017   COVID-19 Vaccine (2 - 2023-24 season) 08/26/2022 (Originally 03/09/2022)   Zoster Vaccines- Shingrix (1 of 2) 11/08/2022 (Originally 01/06/2015)   HIV Screening  08/11/2023 (Originally 01/06/1980)   Medicare Annual Wellness (AWV)  08/11/2023   MAMMOGRAM  12/28/2023   COLONOSCOPY (Pts 45-46yrs Insurance coverage will need to be confirmed)  09/06/2031   INFLUENZA VACCINE  Completed   Hepatitis C Screening  Completed   HPV VACCINES  Aged Out   PAP SMEAR-Modifier  Discontinued   Immunization History  Administered Date(s) Administered   Influenza,inj,Quad PF,6+ Mos 03/10/2021, 04/11/2022   Influenza,inj,Quad PF,6-35 Mos 07/31/2018   Influenza-Unspecified 07/09/2009, 04/23/2013, 05/04/2014, 04/27/2015   Janssen (J&J) SARS-COV-2 Vaccination 02/27/2020   Tdap 07/10/2007    These are the patient goals that we discussed:  Goals Addressed               This Visit's Progress     Patient Stated (pt-stated)        Patient stated that she would like to loose 50 lbs.         This is a list of Health Maintenance Items that are overdue or due now: Health Maintenance Due  Topic Date Due   DTaP/Tdap/Td (2 - Td or Tdap) 07/09/2017   Td vaccine Shingrix vaccine  Orders/Referrals Placed Today: No orders of the defined types were placed in this encounter.  (Contact our referral department at [phone] if you have not spoken with someone about your referral appointment within the next 5 days)    Follow-up Plan Follow-up with Samuel Bouche, NP as planned Schedule shingrix vaccine and td vaccine at the pharmacy. Medicare wellness visit in one  year. Patient will access AVS on my chart.      Health Maintenance, Female Adopting a healthy lifestyle and getting preventive care are important in promoting health and wellness. Ask your health care provider about: The right schedule for you to have regular tests and exams. Things you can do on your own to prevent diseases and keep yourself healthy. What should I know about diet, weight, and exercise? Eat a healthy diet  Eat a diet that includes plenty of vegetables, fruits, low-fat dairy products, and lean protein. Do not eat a lot of foods that are high in solid fats, added sugars, or sodium. Maintain a healthy weight Body mass index (BMI) is used to identify weight problems. It estimates body fat based on height and weight. Your health care provider can help determine your BMI and help you achieve or maintain a healthy weight. Get regular exercise Get regular exercise. This is one of the most important things you can do for your health. Most adults should: Exercise for at least 150 minutes each week. The exercise should increase your heart rate and make you sweat (moderate-intensity exercise). Do strengthening exercises at least twice a week. This is in addition to the moderate-intensity exercise. Spend less time sitting. Even light physical activity can be beneficial. Watch cholesterol and blood lipids Have your blood tested for lipids and cholesterol at 58 years of age, then have this test every 5 years. Have your cholesterol levels checked more often if: Your lipid  or cholesterol levels are high. You are older than 58 years of age. You are at high risk for heart disease. What should I know about cancer screening? Depending on your health history and family history, you may need to have cancer screening at various ages. This may include screening for: Breast cancer. Cervical cancer. Colorectal cancer. Skin cancer. Lung cancer. What should I know about heart disease,  diabetes, and high blood pressure? Blood pressure and heart disease High blood pressure causes heart disease and increases the risk of stroke. This is more likely to develop in people who have high blood pressure readings or are overweight. Have your blood pressure checked: Every 3-5 years if you are 81-66 years of age. Every year if you are 68 years old or older. Diabetes Have regular diabetes screenings. This checks your fasting blood sugar level. Have the screening done: Once every three years after age 44 if you are at a normal weight and have a low risk for diabetes. More often and at a younger age if you are overweight or have a high risk for diabetes. What should I know about preventing infection? Hepatitis B If you have a higher risk for hepatitis B, you should be screened for this virus. Talk with your health care provider to find out if you are at risk for hepatitis B infection. Hepatitis C Testing is recommended for: Everyone born from [birth year] through 1966. Anyone with known risk factors for hepatitis C. Sexually transmitted infections (STIs) Get screened for STIs, including gonorrhea and chlamydia, if: You are sexually active and are younger than 58 years of age. You are older than 58 years of age and your health care provider tells you that you are at risk for this type of infection. Your sexual activity has changed since you were last screened, and you are at increased risk for chlamydia or gonorrhea. Ask your health care provider if you are at risk. Ask your health care provider about whether you are at high risk for HIV. Your health care provider may recommend a prescription medicine to help prevent HIV infection. If you choose to take medicine to prevent HIV, you should first get tested for HIV. You should then be tested every 3 months for as long as you are taking the medicine. Pregnancy If you are about to stop having your period (premenopausal) and you may become pregnant,  seek counseling before you get pregnant. Take 400 to 800 micrograms (mcg) of folic acid every day if you become pregnant. Ask for birth control (contraception) if you want to prevent pregnancy. Osteoporosis and menopause Osteoporosis is a disease in which the bones lose minerals and strength with aging. This can result in bone fractures. If you are 59 years old or older, or if you are at risk for osteoporosis and fractures, ask your health care provider if you should: Be screened for bone loss. Take a calcium or vitamin D supplement to lower your risk of fractures. Be given hormone replacement therapy (HRT) to treat symptoms of menopause. Follow these instructions at home: Alcohol use Do not drink alcohol if: Your health care provider tells you not to drink. You are pregnant, may be pregnant, or are planning to become pregnant. If you drink alcohol: Limit how much you have to: 0-1 drink a day. Know how much alcohol is in your drink. In the [place] one drink equals one 12 oz bottle of beer (355 mL), one 5 oz glass of wine (148 mL), or one 1 oz  glass of hard liquor (44 mL). Lifestyle Do not use any products that contain nicotine or tobacco. These products include cigarettes, chewing tobacco, and vaping devices, such as e-cigarettes. If you need help quitting, ask your health care provider. Do not use street drugs. Do not share needles. Ask your health care provider for help if you need support or information about quitting drugs. General instructions Schedule regular health, dental, and eye exams. Stay current with your vaccines. Tell your health care provider if: You often feel depressed. You have ever been abused or do not feel safe at home. Summary Adopting a healthy lifestyle and getting preventive care are important in promoting health and wellness. Follow your health care provider's instructions about healthy diet, exercising, and getting tested or screened for diseases. Follow your  health care provider's instructions on monitoring your cholesterol and blood pressure. This information is not intended to replace advice given to you by your health care provider. Make sure you discuss any questions you have with your health care provider. Document Revised: 11/14/2020 Document Reviewed: 11/14/2020 Elsevier Patient Education  [place].

## 2022-08-10 NOTE — Progress Notes (Signed)
MEDICARE ANNUAL WELLNESS VISIT  08/10/2022  Telephone Visit Disclaimer This Medicare AWV was conducted by telephone due to national recommendations for restrictions regarding the COVID-19 Pandemic (e.g. social distancing).  I verified, using two identifiers, that I am speaking with Barbara Calderon or their authorized healthcare agent. I discussed the limitations, risks, security, and privacy concerns of performing an evaluation and management service by telephone and the potential availability of an in-person appointment in the future. The patient expressed understanding and agreed to proceed.  Location of Patient: Home Location of Provider (nurse):  In the office.  Subjective:    Barbara Calderon is a 57 y.o. female patient of Samuel Bouche, NP who had a Medicare Annual Wellness Visit today via telephone. [name] is Legally disabled and lives with their family. she has 4 children and a foster son. she reports that she is socially active and does interact with friends/family regularly. she is minimally physically active and enjoys crafts.  Patient Care Team: Samuel Bouche, NP as PCP - General (Nurse Practitioner)     08/10/2022    8:10 AM 06/22/2022    5:56 PM 06/19/2022    9:23 AM 12/13/2021   11:21 AM 08/07/2021    8:59 AM 10/07/2020   10:28 AM  Advanced Directives  Does Patient Have a Medical Advance Directive? No No No No No No  Would patient like information on creating a medical advance directive? No - Patient declined No - Patient declined No - Patient declined  No - Patient declined No - Patient declined    Hospital Utilization Over the Past 12 Months: # of hospitalizations or ER visits: 1 # of surgeries: 1  Review of Systems    Patient reports that her overall health is unchanged compared to last year.  History obtained from chart review and the patient  Patient Reported Readings (BP, Pulse, CBG, Weight, etc) none  Pain Assessment Pain : No/denies pain     Current  Medications & Allergies (verified) Allergies as of 08/10/2022       Reactions   Hydrocodone-acetaminophen Hives, Itching   Latex Rash        Medication List        Accurate as of August 10, 2022  8:23 AM. If you have any questions, ask your nurse or doctor.          albuterol 108 (90 Base) MCG/ACT inhaler Commonly known as: VENTOLIN HFA Inhale 2 puffs into the lungs every 4 (four) hours as needed for wheezing or shortness of breath. NEEDS APPOINTMENT FOR FURTHER REFILLS.   AMBULATORY NON FORMULARY MEDICATION Knee-high, medium compression, graduated compression stockings. Apply to lower extremities. [email or web address], Zippered Compression Stockings, medium circ, long length   aspirin 81 MG chewable tablet Chew 1 tablet (81 mg total) by mouth 2 (two) times daily.   citalopram 40 MG tablet Commonly known as: CELEXA Take 1 tablet (40 mg total) by mouth daily.   diclofenac Sodium 1 % Gel Commonly known as: VOLTAREN Apply 4 g topically 4 (four) times daily.   furosemide 20 MG tablet Commonly known as: LASIX Take 1 tablet by mouth daily as needed for fluid or edema.   gabapentin 100 MG capsule Commonly known as: NEURONTIN Take 1 capsule (100 mg total) by mouth daily. What changed: additional instructions   gabapentin 300 MG capsule Commonly known as: NEURONTIN Take 1 capsule (300 mg total) by mouth at bedtime. NEEDS APPOINTMENT FOR FURTHER REFILLS. What changed: Another medication with the same name  was changed. Make sure you understand how and when to take each.   lidocaine 5 % Commonly known as: LIDODERM Place 1 patch onto the skin daily. Remove & Discard patch within 12 hours or as directed by MD   meloxicam 15 MG tablet Commonly known as: MOBIC One tab PO every 24 hours with a meal for 2 weeks, then once every 24 hours prn pain. What changed:  how much to take how to take this when to take this additional instructions   methocarbamol 500 MG  tablet Commonly known as: ROBAXIN Take 1 tablet (500 mg total) by mouth every 6 (six) hours as needed for muscle spasms.   oxybutynin 10 MG 24 hr tablet Commonly known as: Ditropan XL Take 1 tablet (10 mg total) by mouth at bedtime.   pantoprazole 40 MG tablet Commonly known as: PROTONIX Take 1 tablet (40 mg total) by mouth daily.   phentermine 15 MG capsule Take 1 capsule (15 mg total) by mouth every morning.   potassium chloride SA 20 MEQ tablet Commonly known as: KLOR-CON M Take 1 tablet by mouth daily as needed (ONLY ON THE DAYS OF TAKING LASIX).   predniSONE 50 MG tablet Commonly known as: DELTASONE One tab PO daily for 5 days.   Synthroid 125 MCG tablet Generic drug: levothyroxine Take 1 tablet (125 mcg total) by mouth daily before breakfast.   traMADol 50 MG tablet Commonly known as: ULTRAM Take 1 tablet (50 mg total) by mouth every 6 (six) hours as needed.        History (reviewed): Past Medical History:  Diagnosis Date   Abnormal mammogram    Abnormal Pap smear of cervix    Anxiety    Arthritis    Asthma    Depression    GERD (gastroesophageal reflux disease)    Hashimoto's disease    Heart murmur    Echo was good per pt.   Hyperlipidemia    borderline- no meds   Hypothyroidism    Lymphedema    Lymphedema    bilateral legs   Lymphedema    legs   Neuromuscular disorder (HCC)    fibro many years ago told this, neurpathy legs   Pneumonia    PONV (postoperative nausea and vomiting)    slow to wake up   Sleep apnea    mild no cpap   Thyroid disease    Hashimotos   Varicose veins    Past Surgical History:  Procedure Laterality Date   ABDOMINAL HYSTERECTOMY     partial   APPLICATION OF WOUND VAC     on left lower leg above foot   BLADDER SUSPENSION     CESAREAN SECTION     x2   CHOLECYSTECTOMY OPEN     INCISION AND DRAINAGE OF WOUND     PARTIAL HYSTERECTOMY     SKIN GRAFT     TOTAL KNEE ARTHROPLASTY Right 06/22/2022   Procedure: RIGHT  TOTAL KNEE ARTHROPLASTY;  Surgeon: [name], MD;  Location: [facility];  Service: Orthopedics;  Laterality: Right;   TUBAL LIGATION     Family History  Problem Relation Age of Onset   Colon polyps Mother    Stroke Mother    Hypertension Mother    Colon polyps Father    Prostate cancer Father    Colon cancer Neg Hx    Esophageal cancer Neg Hx    Stomach cancer Neg Hx    Rectal cancer Neg Hx    Social  History   Socioeconomic History   Marital status: Married    Spouse name: Barbara Calderon   Number of children: 4   Years of education: 12   Highest education level: 12th grade  Occupational History    Comment: Legally disabled.  Tobacco Use   Smoking status: Never   Smokeless tobacco: Never  Vaping Use   Vaping Use: Never used  Substance and Sexual Activity   Alcohol use: Yes    Comment: rare 3-4 a year   Drug use: No   Sexual activity: Yes    Partners: Male    Birth control/protection: Surgical  Other Topics Concern   Not on file  Social History Narrative   Lives with her family. She lives with her husband and 4 children and one foster child . She enjoys crafts.   Social Determinants of Health   Financial Resource Strain: Low Risk  (08/10/2022)   Overall Financial Resource Strain (CARDIA)    Difficulty of Paying Living Expenses: Not hard at all  Food Insecurity: No Food Insecurity (08/10/2022)   Hunger Vital Sign    Worried About Running Out of Food in the Last Year: Never true    Ran Out of Food in the Last Year: Never true  Transportation Needs: No Transportation Needs (08/10/2022)   PRAPARE - Hydrologist (Medical): No    Lack of Transportation (Non-Medical): No  Physical Activity: Insufficiently Active (08/10/2022)   Exercise Vital Sign    Days of Exercise per Week: 2 days    Minutes of Exercise per Session: 60 min  Stress: No Stress Concern Present (08/10/2022)   [place]    Feeling of Stress : Not at all  Social Connections: Moderately Isolated (08/10/2022)   Social Connection and Isolation Panel [NHANES]    Frequency of Communication with Friends and Family: More than three times a week    Frequency of Social Gatherings with Friends and Family: More than three times a week    Attends Religious Services: Never    Marine scientist or Organizations: No    Attends Archivist Meetings: Never    Marital Status: Married    Activities of Daily Living    08/10/2022    8:16 AM 06/22/2022    5:57 PM  In your present state of health, do you have any difficulty performing the following activities:  Hearing? 1 0  Comment some hearing loss   Vision? 0 0  Comment wears glasses   Difficulty concentrating or making decisions? 0 0  Walking or climbing stairs? 0 0  Comment uses a cane; takes it easy due to recent knee replacement.   Dressing or bathing? 0 0  Doing errands, shopping? 0 0  Preparing Food and eating ? N   Using the Toilet? N   In the past six months, have you accidently leaked urine? Y   Do you have problems with loss of bowel control? N   Managing your Medications? N   Managing your Finances? N   Housekeeping or managing your Housekeeping? N     Patient Education/ Literacy How often do you need to have someone help you when you read instructions, pamphlets, or other written materials from your doctor or pharmacy?: 1 - Never  Exercise Current Exercise Habits: Structured exercise class, Type of exercise: Other - see comments (PT), Time (Minutes): 60, Frequency (Times/Week): 2, Weekly Exercise (Minutes/Week): 120, Intensity: Moderate, Exercise  limited by: orthopedic condition(s)  Diet Patient reports consuming 2 meals a day and 2 snack(s) a day Patient reports that her primary diet is: Regular Patient reports that she does have regular access to food.   Depression Screen    08/10/2022    8:11 AM 04/11/2022    2:36 PM  01/24/2022    8:20 AM 11/01/2021   10:10 AM 09/11/2021    9:48 AM 08/22/2021    8:29 AM 08/07/2021    8:54 AM  PHQ 2/9 Scores  PHQ - 2 Score 0 0 0 0 0 0 0  PHQ- 9 Score  2   0 0 0     Fall Risk    08/10/2022    8:10 AM 04/11/2022    2:36 PM 01/24/2022    8:20 AM 12/13/2021   11:20 AM 11/01/2021   10:10 AM  Fall Risk   Falls in the past year? 1 1 1 1 1   Number falls in past yr: 1 1 0 1 1  Injury with Fall? 1 1 1 1 1   Comment     hit chin  Risk for fall due to : History of fall(s);Impaired mobility History of fall(s) History of fall(s)  History of fall(s)  Follow up Falls evaluation completed;Education provided;Falls prevention discussed Falls evaluation completed Falls evaluation completed  Falls evaluation completed     Objective:  MAELEE HOOT seemed alert and oriented and she participated appropriately during our telephone visit.  Blood Pressure Weight BMI  BP Readings from Last 3 Encounters:  07/25/22 116/73  06/23/22 117/69  06/19/22 127/86   Wt Readings from Last 3 Encounters:  06/22/22 222 lb (100.7 kg)  06/19/22 222 lb (100.7 kg)  05/07/22 228 lb (103.4 kg)   BMI Readings from Last 1 Encounters:  06/22/22 38.11 kg/m    *Unable to obtain current vital signs, weight, and BMI due to telephone visit type  Hearing/Vision  Saki did not seem to have difficulty with hearing/understanding during the telephone conversation Reports that she has had a formal eye exam by an eye care professional within the past year Reports that she has not had a formal hearing evaluation within the past year *Unable to fully assess hearing and vision during telephone visit type  Cognitive Function:    08/10/2022    8:18 AM 08/07/2021    9:04 AM  6CIT Screen  What Year? 0 points 0 points  What month? 0 points 0 points  What time? 0 points 0 points  Count back from 20 0 points 0 points  Months in reverse 0 points 0 points  Repeat phrase 0 points 0 points  Total Score 0 points 0 points    (Normal:0-7, Significant for Dysfunction: >8)  Normal Cognitive Function Screening: Yes   Immunization & Health Maintenance Record Immunization History  Administered Date(s) Administered   Influenza,inj,Quad PF,6+ Mos 03/10/2021, 04/11/2022   Influenza,inj,Quad PF,6-35 Mos 07/31/2018   Influenza-Unspecified 07/09/2009, 04/23/2013, 05/04/2014, 04/27/2015   Janssen (J&J) SARS-COV-2 Vaccination 02/27/2020   Tdap 07/10/2007    Health Maintenance  Topic Date Due   DTaP/Tdap/Td (2 - Td or Tdap) 07/09/2017   COVID-19 Vaccine (2 - 2023-24 season) 08/26/2022 (Originally 03/09/2022)   Zoster Vaccines- Shingrix (1 of 2) 11/08/2022 (Originally 01/06/2015)   HIV Screening  08/11/2023 (Originally 01/06/1980)   Medicare Annual Wellness (AWV)  08/11/2023   MAMMOGRAM  12/28/2023   COLONOSCOPY (Pts 45-22yrs Insurance coverage will need to be confirmed)  09/06/2031   INFLUENZA VACCINE  Completed  Hepatitis C Screening  Completed   HPV VACCINES  Aged Out   PAP SMEAR-Modifier  Discontinued       Assessment  This is a routine wellness examination for TATIA PETRUCCI.  Health Maintenance: Due or Overdue Health Maintenance Due  Topic Date Due   DTaP/Tdap/Td (2 - Td or Tdap) 07/09/2017    Barbara Calderon does not need a referral for Community Assistance: Care Management:   no Social Work:    no Prescription Assistance:  no Nutrition/Diabetes Education:  no   Plan:  Personalized Goals  Goals Addressed               This Visit's Progress     Patient Stated (pt-stated)        Patient stated that she would like to loose 50 lbs.       Personalized Health Maintenance & Screening Recommendations  Td vaccine Shingrix vaccine  Lung Cancer Screening Recommended: no (Low Dose CT Chest recommended if Age 25-80 years, 30 pack-year currently smoking OR have quit w/in past 15 years) Hepatitis C Screening recommended: no HIV Screening recommended: yes  Advanced Directives: Written  information was not prepared per patient's request.  Referrals & Orders No orders of the defined types were placed in this encounter.   Follow-up Plan Follow-up with Samuel Bouche, NP as planned Schedule shingrix vaccine and td vaccine at the pharmacy. Medicare wellness visit in one year. Patient will access AVS on my chart.   I have personally reviewed and noted the following in the patient's chart:   Medical and social history Use of alcohol, tobacco or illicit drugs  Current medications and supplements Functional ability and status Nutritional status Physical activity Advanced directives List of other physicians Hospitalizations, surgeries, and ER visits in previous 12 months Vitals Screenings to include cognitive, depression, and falls Referrals and appointments  In addition, I have reviewed and discussed with Barbara Calderon certain preventive protocols, quality metrics, and best practice recommendations. A written personalized care plan for preventive services as well as general preventive health recommendations is available and can be mailed to the patient at her request.      Tinnie Gens, RN BSN  08/10/2022

## 2022-08-13 ENCOUNTER — Ambulatory Visit: Payer: Medicare HMO

## 2022-08-15 ENCOUNTER — Encounter: Payer: Medicare HMO | Admitting: Physical Therapy

## 2022-08-20 ENCOUNTER — Encounter: Payer: Self-pay | Admitting: Medical-Surgical

## 2022-08-20 ENCOUNTER — Ambulatory Visit: Payer: Medicare HMO

## 2022-08-20 ENCOUNTER — Ambulatory Visit (INDEPENDENT_AMBULATORY_CARE_PROVIDER_SITE_OTHER): Payer: Medicare HMO | Admitting: Medical-Surgical

## 2022-08-20 VITALS — BP 101/67 | HR 58 | Resp 20 | Ht 64.0 in | Wt 238.5 lb

## 2022-08-20 DIAGNOSIS — G609 Hereditary and idiopathic neuropathy, unspecified: Secondary | ICD-10-CM

## 2022-08-20 DIAGNOSIS — E876 Hypokalemia: Secondary | ICD-10-CM

## 2022-08-20 DIAGNOSIS — M19072 Primary osteoarthritis, left ankle and foot: Secondary | ICD-10-CM | POA: Diagnosis not present

## 2022-08-20 DIAGNOSIS — I890 Lymphedema, not elsewhere classified: Secondary | ICD-10-CM

## 2022-08-20 DIAGNOSIS — J4520 Mild intermittent asthma, uncomplicated: Secondary | ICD-10-CM

## 2022-08-20 DIAGNOSIS — Z7689 Persons encountering health services in other specified circumstances: Secondary | ICD-10-CM | POA: Diagnosis not present

## 2022-08-20 DIAGNOSIS — R7309 Other abnormal glucose: Secondary | ICD-10-CM | POA: Diagnosis not present

## 2022-08-20 MED ORDER — FUROSEMIDE 20 MG PO TABS: 20.0000 mg | ORAL_TABLET | Freq: Every day | ORAL | 1 refills | Status: AC | PRN

## 2022-08-20 MED ORDER — TIRZEPATIDE 2.5 MG/0.5ML ~~LOC~~ SOAJ: 2.5000 mg | SUBCUTANEOUS | 0 refills | Status: DC

## 2022-08-20 MED ORDER — GABAPENTIN 100 MG PO CAPS: 100.0000 mg | ORAL_CAPSULE | Freq: Two times a day (BID) | ORAL | 1 refills | Status: DC

## 2022-08-20 MED ORDER — METHOCARBAMOL 500 MG PO TABS: 500.0000 mg | ORAL_TABLET | Freq: Four times a day (QID) | ORAL | 1 refills | Status: DC | PRN

## 2022-08-20 MED ORDER — LIDOCAINE 5 % EX PTCH: 1.0000 | MEDICATED_PATCH | CUTANEOUS | 0 refills | Status: DC

## 2022-08-20 MED ORDER — ALBUTEROL SULFATE HFA 108 (90 BASE) MCG/ACT IN AERS: 2.0000 | INHALATION_SPRAY | RESPIRATORY_TRACT | 0 refills | Status: DC | PRN

## 2022-08-20 MED ORDER — MELOXICAM 15 MG PO TABS: 15.0000 mg | ORAL_TABLET | Freq: Every day | ORAL | 3 refills | Status: DC

## 2022-08-20 MED ORDER — POTASSIUM CHLORIDE CRYS ER 20 MEQ PO TBCR: 20.0000 meq | EXTENDED_RELEASE_TABLET | Freq: Every day | ORAL | 1 refills | Status: AC | PRN

## 2022-08-20 NOTE — Progress Notes (Signed)
Established Patient Office Visit  Subjective   Patient ID: Barbara Calderon, female   DOB: January 02, 1966 Age: 57 y.o. MRN: [id number]   Chief Complaint  Patient presents with   Follow-up   Weight Management Screening   Medication Refill   HPI Pleasant 57 year old female presenting today for the following:  Lymphedema: has been having to increase her Lasix to 72m twice daily lately due to an increase in swelling since her knee surgery. Taking potassium  daily as instructed with taking Lasix. Feels the medication is helping with the swelling some.   Neuropathy: taking Gabapentin 1048min the morning and often takes an afternoon dose. At night, takes Gabapentin 30051mThis is helping to keep her symptoms managed.   Chronic pain: taking meloxicam 42m27mily and using methocarbamol 500mg74m prn. Also has lidocaine patches to use as needed.   Requesting a refill on her albuterol inhaler.   Continues to worry about her weight. Was using phentermine for a while but had to stop this for surgery. After cleared, she finished the prescription but reports that she only had a week or so left. Has a new insurance now and interested to know if any of the weight loss medications (injections) are covered under her new plan.    Objective:    Vitals:   08/20/22 1358  BP: 101/67  Pulse: (!) 58  Resp: 20  Height: 5' 4"$  (1.626 m)  Weight: 238 lb 8 oz (108.2 kg)  SpO2: 99%  BMI (Calculated): 40.92    Physical Exam Vitals reviewed.  Constitutional:      General: She is not in acute distress.    Appearance: Normal appearance. She is obese. She is not ill-appearing.  HENT:     Head: Normocephalic and atraumatic.  Cardiovascular:     Rate and Rhythm: Normal rate and regular rhythm.     Pulses: Normal pulses.     Heart sounds: Normal heart sounds.  Pulmonary:     Effort: Pulmonary effort is normal. No respiratory distress.     Breath sounds: Normal breath sounds. No wheezing, rhonchi or rales.   Skin:    General: Skin is warm and dry.  Neurological:     Mental Status: She is alert and oriented to person, place, and time.  Psychiatric:        Mood and Affect: Mood normal.        Behavior: Behavior normal.        Thought Content: Thought content normal.        Judgment: Judgment normal.   No results found for this or any previous visit (from the past 24 hour(s)).     The 10-year ASCVD risk score (Arnett DK, et al., 2019) is: 1.5%   Values used to calculate the score:     Age: 47 ye21s     Sex: Female     Is Non-Hispanic African American: No     Diabetic: No     Tobacco smoker: No     Systolic Blood Pressure: 101 [id number]     Is BP treated: No     HDL Cholesterol: 68 mg/dL     Total Cholesterol: 231 mg/dL   Assessment & Plan:   1. Primary osteoarthritis of left ankle Refilling Meloxicam.  - meloxicam (MOBIC) 15 MG tablet; Take 1 tablet (15 mg total) by mouth daily.  Dispense: 90 tablet; Refill: 3  2. Encounter for weight management 3. Obesity, morbid, BMI 40.0-49.9 (HCC) [place]. [place]  2.79[place] weekly to see if we can get this covered. Checking Hemoglobin A1c.  - Hemoglobin A1c  4. Hypokalemia Checking CMP.  - COMPLETE METABOLIC PANEL WITH GFR  5. Lymphedema Recommend using conservative measures including elevation, compression socks/stockings, and sodium restriction. Limit furosemide to once daily if possible to prevent hypokalemia. Continue potassium daily when taking furosemide.   6. Mild intermittent asthma without complication Refilling albuterol.   7. Peripheral neuropathy, idiopathic Continue gabapentin 1043min the morning and afternoon, 30029mt night. Refill sent.   Return if symptoms worsen or fail to improve.  ___________________________________________ JoyClearnce SorrelNP, APRN, FNP-BC [facility]

## 2022-08-21 LAB — COMPLETE METABOLIC PANEL WITH GFR
AG Ratio: 1.6 (calc) (ref 1.0–2.5)
ALT: 8 U/L (ref 6–29)
AST: 13 U/L (ref 10–35)
Albumin: 4.4 g/dL (ref 3.6–5.1)
Alkaline phosphatase (APISO): 80 U/L (ref 37–153)
BUN: 25 mg/dL (ref 7–25)
CO2: 26 mmol/L (ref 20–32)
Calcium: 9.6 mg/dL (ref 8.6–10.4)
Chloride: 104 mmol/L (ref 98–110)
Creat: 0.8 mg/dL (ref 0.50–1.03)
Globulin: 2.8 g/dL (calc) (ref 1.9–3.7)
Glucose, Bld: 92 mg/dL (ref 65–99)
Potassium: 4.4 mmol/L (ref 3.5–5.3)
Sodium: 141 mmol/L (ref 135–146)
Total Bilirubin: 0.4 mg/dL (ref 0.2–1.2)
Total Protein: 7.2 g/dL (ref 6.1–8.1)
eGFR: 86 mL/min/{1.73_m2} (ref >=60)

## 2022-08-21 LAB — HEMOGLOBIN A1C
Hgb A1c MFr Bld: 5.5 % of total Hgb (ref <5.7)
Mean Plasma Glucose: 111 mg/dL
eAG (mmol/L): 6.2 mmol/L

## 2022-08-23 ENCOUNTER — Ambulatory Visit: Payer: Medicare HMO

## 2022-08-23 DIAGNOSIS — M6281 Muscle weakness (generalized): Secondary | ICD-10-CM

## 2022-08-23 DIAGNOSIS — R600 Localized edema: Secondary | ICD-10-CM

## 2022-08-23 DIAGNOSIS — R2689 Other abnormalities of gait and mobility: Secondary | ICD-10-CM | POA: Diagnosis not present

## 2022-08-23 DIAGNOSIS — M25561 Pain in right knee: Secondary | ICD-10-CM | POA: Diagnosis not present

## 2022-08-23 NOTE — Therapy (Signed)
OUTPATIENT PHYSICAL THERAPY LOWER EXTREMITY TREATMENT   Patient Name: Barbara Calderon MRN: [id number] DOB:June 27, 1966, 57 y.o., female Today's Date: 08/23/2022  END OF SESSION:  PT End of Session - 08/23/22 1526     Visit Number 10    Number of Visits 16    Date for PT Re-Evaluation 09/04/22    Authorization Type [employer]    PT Start Time 1526    PT Stop Time 1611    PT Time Calculation (min) 45 min    Activity Tolerance Patient tolerated treatment well    Behavior During Therapy WFL for tasks assessed/performed               Past Medical History:  Diagnosis Date   Abnormal mammogram    Abnormal Pap smear of cervix    Anxiety    Arthritis    Asthma    Depression    GERD (gastroesophageal reflux disease)    Hashimoto's disease    Heart murmur    Echo was good per pt.   Hyperlipidemia    borderline- no meds   Hypothyroidism    Lymphedema    Lymphedema    bilateral legs   Lymphedema    legs   Neuromuscular disorder (HCC)    fibro many years ago told this, neurpathy legs   Pneumonia    PONV (postoperative nausea and vomiting)    slow to wake up   Sleep apnea    mild no cpap   Thyroid disease    Hashimotos   Varicose veins    Past Surgical History:  Procedure Laterality Date   ABDOMINAL HYSTERECTOMY     partial   APPLICATION OF WOUND VAC     on left lower leg above foot   BLADDER SUSPENSION     CESAREAN SECTION     x2   CHOLECYSTECTOMY OPEN     INCISION AND DRAINAGE OF WOUND     PARTIAL HYSTERECTOMY     SKIN GRAFT     TOTAL KNEE ARTHROPLASTY Right 06/22/2022   Procedure: RIGHT TOTAL KNEE ARTHROPLASTY;  Surgeon: Mcarthur Rossetti, MD;  Location: [facility];  Service: Orthopedics;  Laterality: Right;   TUBAL LIGATION     Patient Active Problem List   Diagnosis Date Noted   Lumbar spondylosis 07/18/2022   Status post total right knee replacement 06/22/2022   Preoperative clearance 04/11/2022   Primary osteoarthritis of left ankle 12/12/2021    Abnormal finding on MRI of brain 08/03/2021   Chronic intractable headache 08/03/2021   Mild sleep apnea 04/15/2019   Chronic venous insufficiency 11/12/2014   Family history of DVT 11/12/2014   Lymphedema 11/12/2014   Vaginal vault prolapse 11/06/2013   SUI (stress urinary incontinence, female) 08/03/2013   Obesity, morbid, BMI 40.0-49.9 ([place]) 08/03/2013   Cystocele 08/03/2013   Asthma 09/20/2011   Depression 08/22/2011   History of gastroesophageal reflux (GERD) 08/22/2011   SHINGLES 08/08/2007   Hypothyroidism 05/22/2007   POLYARTHRALGIA 05/22/2007   LEG EDEMA 05/22/2007   URINARY FREQUENCY 05/22/2007    PCP: Samuel Bouche, NP  REFERRING PROVIDER: Jean Rosenthal, MD  REFERRING DIAG: s/p R TKR  THERAPY DIAG:  Muscle weakness (generalized)  Acute pain of right knee  Other abnormalities of gait and mobility  Localized edema  Rationale for Evaluation and Treatment: Rehabilitation  ONSET DATE: 06/22/2022  SUBJECTIVE:   SUBJECTIVE STATEMENT: Patient reports she had a fall since last PT visit, states she was walking from kitchen to den when she lost her  balance and fell to R side. Patient states she has bruises or injury except a sore R shoulder from bracing her fall.   PERTINENT HISTORY: GERD, HLD, lymphedema, fibromyalgia, h/o imbalance (vision, hearing changes)-- MS ruled out. H/o migraines. PAIN:  Are you having pain? Yes: NPRS scale: 0/10 Pain location: R knee Pain description: much improved since eval Aggravating factors: ROM in therapy Relieving factors: meds  6/10 rib pain, eases with arms still  10/10 on L knee with palpation  PRECAUTIONS: None  WEIGHT BEARING RESTRICTIONS: No  FALLS:  Has patient fallen in last 6 months? Yes. Number of falls estimates 6  LIVING ENVIRONMENT: Lives with: lives with their family *She is working to adopt a 58 year old and is primary caregiver for 58year old. Lives in: House/apartment Stairs: Yes: External: 4  steps; has rail Has following equipment at home: Gilford Rile - 2 wheeled; used a cane in the past  OCCUPATION: retired Scientific laboratory technician; on disability due to problems with lymphedema  PLOF: Independent  PATIENT GOALS: Improve mobility and stability. "I want to be able to walk."  NEXT MD VISIT:  in 4 weeks  OBJECTIVE:   PATIENT SURVEYS:  FOTO 52% (goal to 77%) 08/02/22:    SENSATION: Some h/o abnormal sensation in her L toes (may be from prior meds)  EDEMA:  Has a h/o lymphedema Circumferential measurements: L 19.5" and R 21.0" Incision is well approximated and clean/dry with steri-strips intact  POSTURE: Maintains mild R knee flexion in standing  PALPATION: Tenderness over the lateral distal aspect of the knee  LOWER EXTREMITY ROM:  AROM Right eval Left eval Right 07/31/22 Right 08/07/22  Knee flexion 85  98 105  Knee extension -18  -9 -8                    PROM      Knee flexion 92   110  Knee extension -12      LOWER EXTREMITY MMT: >3/5 throughout LEs   FUNCTIONAL TESTS:  5 times sit to stand: 22.18 seconds without UEs  Gait speed=20 ft/8.08 seconds=2.48 ft/sec  GAIT: Distance walked: 100 Assistive device utilized: Environmental consultant - 2 wheeled Level of assistance: Modified independence Comments: R knee maintains mild flexion with foot flat Stairs: mod indep step to pattern with bilat handrails x 4 steps   [facility] Adult PT Treatment:                                                DATE: 08/23/2022 Therapeutic Exercise: Recumbent bike warm up x 2mn (breaks as needed) Supine HS/ITB with strap 2x30" Bent knee fall out x10 (abd/add resistance provided by therapist) Seated flexion self stretch 2x30" B prone HS curls YTB 2x10 B STS 2x5 hands braced on thighs R thomas stretch 2x30" Runner lunges (top step) with weight shifting x10 B Staggered stance weight shifting, focus on great toe push-off Staggered stance A/P stepping over yoga block 2x10 Captain morgan (R) at  counter with pillow 10x5"    [facility]                                                DATE: 08/09/2022 Therapeutic Exercise: NuStep L6 x  5 min Supine HS/ITB with strap 2x30" Bent knee fall out x10 (abd/add resistance provided by therapist) Seated flexion self stretch 2x30" B prone HS curls YTB 2x10 B STS 2x5 hands braced on thighs R thomas stretch 2x30" Runner lunges (top step) with weight shifting x10 B Staggered stance weight shifting, focus on great toe push-off Staggered stance A/P stepping over yoga block 2x10 4" lateral step down x10 B Resisted side stepping RTB --> GTB (counter)    PATIENT EDUCATION:  Education details: HEP Person educated: Patient Education method: Consulting civil engineer, Media planner, and Handouts Education comprehension: verbalized understanding and returned demonstration  HOME EXERCISE PROGRAM: Access Code: [email or web address] URL: [email or web address] Date: 08/09/2022 Prepared by: Helane Gunther  Exercises - Quad Setting and Stretching  - 2 x daily - 7 x weekly - 1 sets - 10 reps - Supine Active Straight Leg Raise  - 1 x daily - 7 x weekly - 3 sets - 15 reps - Supine Heel Slides  - 1 x daily - 7 x weekly - 3 sets - 15 reps - Sidelying Hip Abduction  - 2 x daily - 7 x weekly - 1 sets - 10 reps - Seated Long Arc Quad  - 1 x daily - 7 x weekly - 3 sets - 10 reps - Seated Knee Flexion Stretch  - 1 x daily - 7 x weekly - 3 sets - 10 reps - Long Sitting Ankle Pumps  - 1 x daily - 7 x weekly - 3 sets - 10 reps - Seated Pelvic Floor Contraction with Isometric Hip Adduction  - 1 x daily - 7 x weekly - 3 sets - 10 reps - 5 sec hold - Seated Pelvic Floor Contraction with Hip Abduction and Resistance Loop  - 1 x daily - 7 x weekly - 3 sets - 10 reps - Gastroc Stretch with Foot at Wall  - 1 x daily - 7 x weekly - 2 sets - 30 sec hold - Standing Soleus Stretch on Step  - 1 x daily - 7 x weekly - 2 sets - 30 sec hold - Supine Quadriceps Stretch with  Strap on Table  - 1 x daily - 7 x weekly - 2 sets - 30 sec hold - Prone Terminal Knee Extension  - 1 x daily - 7 x weekly - 3 sets - 10 reps - Prone Knee Flexion  - 1 x daily - 7 x weekly - 3 sets - 10 reps - Standing Balance in Corner with Eyes Closed  - 1 x daily - 7 x weekly - 1 sets - 5 reps - 30 sec hold - Seated Knee Extension with Resistance  - 1 x daily - 7 x weekly - 3 sets - 10 reps - Seated Hamstring Curls with Resistance  - 1 x daily - 7 x weekly - 3 sets - 10 reps - Sit to Stand  - 1 x daily - 7 x weekly - 3 sets - 10 reps  Patient Education - Scar Massage  ASSESSMENT:  CLINICAL IMPRESSION: Patient able to tolerate 3 min on recumbent bike before requesting to stop due to L foot pain. Resistance add on abd/add phase of unilateral hip abd to challenge pelvic stability and hip strength. Hip abd progressed with standing hip abd isometrics. Functional knee flexion and gait patterning progressed with anterior/posterior stepping over high block.   OBJECTIVE IMPAIRMENTS: Abnormal gait, decreased activity tolerance, decreased balance, difficulty walking, decreased ROM, decreased strength, increased edema, increased fascial restrictions, and pain.  GOALS: Goals reviewed with patient? Yes   SHORT TERM GOALS: Target date: 08/03/2022    Independent with initial HEP. Baseline: [facility] HEP [facility]-- PT progressed today. Goal status: INITIAL 1/25: MET  2.  The patient will improve AROM R knee to 104 degrees flexion.  Baseline: 85 degrees Goal status: INITIAL 1/25: IN PROGRESS (89 degrees)  3.  The patient will improve AROM R knee to -10 degrees during LAQ. Baseline: -18 degrees Goal status: INITIAL 1/25: MET (-10 degrees)   LONG TERM GOALS: Target date: 08/31/2022   Independent with advanced/ongoing HEP to improve outcomes and carryover.  Baseline: has initial HEP Goal status: INITIAL  2.  Patient will demonstrate right knee flexion to 115 deg to ascend/descend  stairs. Baseline: 85 degrees AROM Goal status: INITIAL  3.  Patient will demonstrate full right knee extension for safety with gait. Baseline: -18 during LAQ Goal status: INITIAL  4.  Patient will be able to ambulate 500' safely with LRAD and normal gait pattern to access community.  Baseline: 100 + ft with RW mod indep Goal status: INITIAL  5.  Patient will be able to ascend/descend stairs with 1 HR and reciprocal step pattern safely to access home and community.  Baseline: step to pattern with bilat HRs Goal status: INITIAL  7.  Patient will demonstrate improved functional LE strength by completing 5x STS in <17 seconds.  (MCID 5 seconds) Baseline: 22.18 seconds Goal status: INITIAL  PLAN:  PT FREQUENCY: 2x/week  PT DURATION: 8 weeks  PLANNED INTERVENTIONS: Therapeutic exercises, Therapeutic activity, Neuromuscular re-education, Balance training, Gait training, Patient/Family education, Self Care, Joint mobilization, Stair training, DME instructions, Aquatic Therapy, Dry Needling, Electrical stimulation, Cryotherapy, Moist heat, scar mobilization, Taping, Vasopneumatic device, and Manual therapy  PLAN FOR NEXT SESSION: Knee AROM/PROM, gait training reducing reliance on RW, pain mgmt and scar tissue massage when indicated.   Hardin Negus, PTA 08/23/2022, 4:11 PM

## 2022-08-28 ENCOUNTER — Encounter: Payer: Self-pay | Admitting: Physical Therapy

## 2022-08-28 ENCOUNTER — Ambulatory Visit: Payer: Medicare HMO | Admitting: Physical Therapy

## 2022-08-28 DIAGNOSIS — M25561 Pain in right knee: Secondary | ICD-10-CM

## 2022-08-28 DIAGNOSIS — M6281 Muscle weakness (generalized): Secondary | ICD-10-CM | POA: Diagnosis not present

## 2022-08-28 DIAGNOSIS — R2689 Other abnormalities of gait and mobility: Secondary | ICD-10-CM | POA: Diagnosis not present

## 2022-08-28 DIAGNOSIS — R600 Localized edema: Secondary | ICD-10-CM

## 2022-08-28 NOTE — Therapy (Signed)
OUTPATIENT PHYSICAL THERAPY LOWER EXTREMITY TREATMENT   Patient Name: Barbara Calderon MRN: [id number] DOB:07-18-1965, 57 y.o., female Today's Date: 08/28/2022  END OF SESSION:  PT End of Session - 08/28/22 1539     Visit Number 11    Number of Visits 16    Date for PT Re-Evaluation 09/04/22    Authorization Type [employer]    PT Start Time [id number]    PT Stop Time 1620    PT Time Calculation (min) 41 min    Activity Tolerance Patient tolerated treatment well    Behavior During Therapy WFL for tasks assessed/performed               Past Medical History:  Diagnosis Date   Abnormal mammogram    Abnormal Pap smear of cervix    Anxiety    Arthritis    Asthma    Depression    GERD (gastroesophageal reflux disease)    Hashimoto's disease    Heart murmur    Echo was good per pt.   Hyperlipidemia    borderline- no meds   Hypothyroidism    Lymphedema    Lymphedema    bilateral legs   Lymphedema    legs   Neuromuscular disorder (HCC)    fibro many years ago told this, neurpathy legs   Pneumonia    PONV (postoperative nausea and vomiting)    slow to wake up   Sleep apnea    mild no cpap   Thyroid disease    Hashimotos   Varicose veins    Past Surgical History:  Procedure Laterality Date   ABDOMINAL HYSTERECTOMY     partial   APPLICATION OF WOUND VAC     on left lower leg above foot   BLADDER SUSPENSION     CESAREAN SECTION     x2   CHOLECYSTECTOMY OPEN     INCISION AND DRAINAGE OF WOUND     PARTIAL HYSTERECTOMY     SKIN GRAFT     TOTAL KNEE ARTHROPLASTY Right 06/22/2022   Procedure: RIGHT TOTAL KNEE ARTHROPLASTY;  Surgeon: Mcarthur Rossetti, MD;  Location: [facility];  Service: Orthopedics;  Laterality: Right;   TUBAL LIGATION     Patient Active Problem List   Diagnosis Date Noted   Lumbar spondylosis 07/18/2022   Status post total right knee replacement 06/22/2022   Preoperative clearance 04/11/2022   Primary osteoarthritis of left ankle 12/12/2021    Abnormal finding on MRI of brain 08/03/2021   Chronic intractable headache 08/03/2021   Mild sleep apnea 04/15/2019   Chronic venous insufficiency 11/12/2014   Family history of DVT 11/12/2014   Lymphedema 11/12/2014   Vaginal vault prolapse 11/06/2013   SUI (stress urinary incontinence, female) 08/03/2013   Obesity, morbid, BMI 40.0-49.9 ([place]) 08/03/2013   Cystocele 08/03/2013   Asthma 09/20/2011   Depression 08/22/2011   History of gastroesophageal reflux (GERD) 08/22/2011   SHINGLES 08/08/2007   Hypothyroidism 05/22/2007   POLYARTHRALGIA 05/22/2007   LEG EDEMA 05/22/2007   URINARY FREQUENCY 05/22/2007    PCP: Samuel Bouche, NP  REFERRING PROVIDER: Jean Rosenthal, MD  REFERRING DIAG: s/p R TKR  THERAPY DIAG:  Muscle weakness (generalized)  Acute pain of right knee  Other abnormalities of gait and mobility  Localized edema  Rationale for Evaluation and Treatment: Rehabilitation  ONSET DATE: 06/22/2022  SUBJECTIVE:   SUBJECTIVE STATEMENT: Patient will be following up with Dr. Ninfa Linden tomorrow. Pt states L ankle has been swelling a lot. Pt notes L ankle  has started hurting in the front for the last couple of weeks. Pt reports no other falls. Pt states she was very sore after last PT session.    PERTINENT HISTORY: GERD, HLD, lymphedema, fibromyalgia, h/o imbalance (vision, hearing changes)-- MS ruled out. H/o migraines. PAIN:  Are you having pain? Yes: NPRS scale: 0/10 Pain location: R knee Pain description: much improved since eval Aggravating factors: ROM in therapy Relieving factors: meds  6/10 rib pain, eases with arms still  10/10 on L knee with palpation  PRECAUTIONS: None  WEIGHT BEARING RESTRICTIONS: No  FALLS:  Has patient fallen in last 6 months? Yes. Number of falls estimates 6  LIVING ENVIRONMENT: Lives with: lives with their family *She is working to adopt a 58 year old and is primary caregiver for 58year old. Lives in:  House/apartment Stairs: Yes: External: 4 steps; has rail Has following equipment at home: Gilford Rile - 2 wheeled; used a cane in the past  OCCUPATION: retired Scientific laboratory technician; on disability due to problems with lymphedema  PLOF: Independent  PATIENT GOALS: Improve mobility and stability. "I want to be able to walk."  NEXT MD VISIT:  in 4 weeks  OBJECTIVE:   PATIENT SURVEYS:  FOTO 52% (goal to 77%)  SENSATION: Some h/o abnormal sensation in her L toes (may be from prior meds)  EDEMA:  Has a h/o lymphedema Circumferential measurements: L 19.5" and R 21.0" Incision is well approximated and clean/dry with steri-strips intact  POSTURE: Maintains mild R knee flexion in standing  PALPATION: Tenderness over the lateral distal aspect of the knee  LOWER EXTREMITY ROM:  AROM Right eval Left eval Right 07/31/22 Right 08/07/22 Right 08/28/22  Knee flexion 85 115 98 105 108  Knee extension -18  -9 -8 -5                    PROM       Knee flexion 92   110 110  Knee extension -12    0   LOWER EXTREMITY MMT: >3/5 throughout LEs   FUNCTIONAL TESTS:  5 times sit to stand: 22.18 seconds without UEs  (from eval) 5 times sit to stand: 16 sec without UEs (08/28/22)   Gait speed=20 ft/8.08 seconds=2.48 ft/sec  GAIT: Distance walked: 100 Assistive device utilized: Environmental consultant - 2 wheeled Level of assistance: Modified independence Comments: R knee maintains mild flexion with foot flat Stairs: mod indep step to pattern with bilat handrails x 4 steps  [facility] Adult PT Treatment:                                                DATE: 08/28/22 Therapeutic Exercise: Recumbent bike half revolutions x 3 min, full revolutions x 3 min Quad set with over pressure in supine  Manual Therapy: Knee flexion/ext PROM Grade II to III knee mobilization for flex/ext Scar massage Therapeutic Activity: 5x STS = 16 sec 8 laps around gym no a/d working on activity = 600' Ascend/descend 10 steps with 1 HR on  L    [facility] Adult PT Treatment:                                                DATE: 08/23/2022 Therapeutic  Exercise: Recumbent bike warm up x 46mn (breaks as needed) Supine HS/ITB with strap 2x30" Bent knee fall out x10 (abd/add resistance provided by therapist) Seated flexion self stretch 2x30" B prone HS curls YTB 2x10 B STS 2x5 hands braced on thighs R thomas stretch 2x30" Runner lunges (top step) with weight shifting x10 B Staggered stance weight shifting, focus on great toe push-off Staggered stance A/P stepping over yoga block 2x10 Captain morgan (R) at counter with pillow 10x5"    [facility] Adult PT Treatment:                                                DATE: 08/09/2022 Therapeutic Exercise: NuStep L6 x 5 min Supine HS/ITB with strap 2x30" Bent knee fall out x10 (abd/add resistance provided by therapist) Seated flexion self stretch 2x30" B prone HS curls YTB 2x10 B STS 2x5 hands braced on thighs R thomas stretch 2x30" Runner lunges (top step) with weight shifting x10 B Staggered stance weight shifting, focus on great toe push-off Staggered stance A/P stepping over yoga block 2x10 4" lateral step down x10 B Resisted side stepping RTB --> GTB (counter)    PATIENT EDUCATION:  Education details: HEP Person educated: Patient Education method: EConsulting civil engineer DMedia planner and Handouts Education comprehension: verbalized understanding and returned demonstration  HOME EXERCISE PROGRAM: Access Code: [email or web address] URL: [email or web address] Date: 08/09/2022 Prepared by: KHelane Gunther Exercises - Quad Setting and Stretching  - 2 x daily - 7 x weekly - 1 sets - 10 reps - Supine Active Straight Leg Raise  - 1 x daily - 7 x weekly - 3 sets - 15 reps - Supine Heel Slides  - 1 x daily - 7 x weekly - 3 sets - 15 reps - Sidelying Hip Abduction  - 2 x daily - 7 x weekly - 1 sets - 10 reps - Seated Long Arc Quad  - 1 x daily - 7 x weekly - 3 sets - 10 reps - Seated Knee  Flexion Stretch  - 1 x daily - 7 x weekly - 3 sets - 10 reps - Long Sitting Ankle Pumps  - 1 x daily - 7 x weekly - 3 sets - 10 reps - Seated Pelvic Floor Contraction with Isometric Hip Adduction  - 1 x daily - 7 x weekly - 3 sets - 10 reps - 5 sec hold - Seated Pelvic Floor Contraction with Hip Abduction and Resistance Loop  - 1 x daily - 7 x weekly - 3 sets - 10 reps - Gastroc Stretch with Foot at Wall  - 1 x daily - 7 x weekly - 2 sets - 30 sec hold - Standing Soleus Stretch on Step  - 1 x daily - 7 x weekly - 2 sets - 30 sec hold - Supine Quadriceps Stretch with Strap on Table  - 1 x daily - 7 x weekly - 2 sets - 30 sec hold - Prone Terminal Knee Extension  - 1 x daily - 7 x weekly - 3 sets - 10 reps - Prone Knee Flexion  - 1 x daily - 7 x weekly - 3 sets - 10 reps - Standing Balance in Corner with Eyes Closed  - 1 x daily - 7 x weekly - 1 sets - 5 reps - 30 sec hold - Seated Knee Extension with Resistance  - 1  x daily - 7 x weekly - 3 sets - 10 reps - Seated Hamstring Curls with Resistance  - 1 x daily - 7 x weekly - 3 sets - 10 reps - Sit to Stand  - 1 x daily - 7 x weekly - 3 sets - 10 reps  Patient Education - Scar Massage  ASSESSMENT:  CLINICAL IMPRESSION: Knee flexion continues to be limited. Feels part of it is scar tightness. Provided more scar massage with knee flexed. Extension continues to improve. Antalgic with stair descent and decreased eccentric knee control -- will need to further work on this; however, part of this may be due to decreased knee flexion. Pt has been slowly meeting her goals.    OBJECTIVE IMPAIRMENTS: Abnormal gait, decreased activity tolerance, decreased balance, difficulty walking, decreased ROM, decreased strength, increased edema, increased fascial restrictions, and pain.    GOALS: Goals reviewed with patient? Yes   SHORT TERM GOALS: Target date: 08/03/2022    Independent with initial HEP. Baseline: Has HEP from [facility]-- PT progressed today. Goal  status: INITIAL 1/25: MET  2.  The patient will improve AROM R knee to 104 degrees flexion.  Baseline: 85 degrees Goal status: MET  3.  The patient will improve AROM R knee to -10 degrees during LAQ. Baseline: -18 degrees Goal status: MET   LONG TERM GOALS: Target date: 08/31/2022   Independent with advanced/ongoing HEP to improve outcomes and carryover.  Baseline: has initial HEP Goal status: IN PROGRESS  2.  Patient will demonstrate right knee flexion to 115 deg to ascend/descend stairs. Baseline: 85 degrees AROM 110 deg (08/28/22) Goal status: IN PROGRESS  3.  Patient will demonstrate full right knee extension for safety with gait. Baseline: -18 during LAQ -5 (08/28/22) Goal status: IN PROGRESS  4.  Patient will be able to ambulate 500' safely with LRAD and normal gait pattern to access community.  Baseline: 100 + ft with RW mod indep 600' ind (08/28/22) Goal status: MET  5.  Patient will be able to ascend/descend stairs with 1 HR and reciprocal step pattern safely to access home and community.  Baseline: step to pattern with bilat Hrs Step to pattern with descent (08/28/22) Goal status: IN PROGRESS  7.  Patient will demonstrate improved functional LE strength by completing 5x STS in <17 seconds.  (MCID 5 seconds) Baseline: 22.18 seconds 16 sec (08/28/22) Goal status: MET  PLAN:  PT FREQUENCY: 2x/week  PT DURATION: 8 weeks  PLANNED INTERVENTIONS: Therapeutic exercises, Therapeutic activity, Neuromuscular re-education, Balance training, Gait training, Patient/Family education, Self Care, Joint mobilization, Stair training, DME instructions, Aquatic Therapy, Dry Needling, Electrical stimulation, Cryotherapy, Moist heat, scar mobilization, Taping, Vasopneumatic device, and Manual therapy  PLAN FOR NEXT SESSION: Knee AROM/PROM, gait training reducing reliance on RW, pain mgmt and scar tissue massage when indicated.   Jimmie Dattilio April Ma L Stormy Connon, PT 08/28/2022, 3:39 PM

## 2022-08-29 ENCOUNTER — Ambulatory Visit (INDEPENDENT_AMBULATORY_CARE_PROVIDER_SITE_OTHER): Payer: Medicare HMO | Admitting: Orthopaedic Surgery

## 2022-08-29 ENCOUNTER — Ambulatory Visit (INDEPENDENT_AMBULATORY_CARE_PROVIDER_SITE_OTHER): Payer: Medicare HMO | Admitting: Sports Medicine

## 2022-08-29 ENCOUNTER — Encounter: Payer: Self-pay | Admitting: Orthopaedic Surgery

## 2022-08-29 DIAGNOSIS — Z96651 Presence of right artificial knee joint: Secondary | ICD-10-CM

## 2022-08-29 DIAGNOSIS — M47816 Spondylosis without myelopathy or radiculopathy, lumbar region: Secondary | ICD-10-CM

## 2022-08-29 DIAGNOSIS — M19072 Primary osteoarthritis, left ankle and foot: Secondary | ICD-10-CM

## 2022-08-29 MED ORDER — TRAMADOL HCL 50 MG PO TABS: 50.0000 mg | ORAL_TABLET | Freq: Two times a day (BID) | ORAL | 3 refills | Status: DC | PRN

## 2022-08-29 MED ORDER — TIZANIDINE HCL 2 MG PO CAPS: 2.0000 mg | ORAL_CAPSULE | Freq: Three times a day (TID) | ORAL | 1 refills | Status: DC | PRN

## 2022-08-29 NOTE — Assessment & Plan Note (Signed)
Continues to have bilateral lower extremity paresthesias, she did have some axial back pain, x-rays did show multilevel spondylitic processes in the lumbar spine, she is on max tolerated gabapentin, she has done aggressive formal physical therapy and her back pain is gone, she still has paresthesias both feet all toes, and she would like additional workup, I am not sure if this is coming from a radicular source or peripheral neuropathy so I would like a nerve conduction and EMG.

## 2022-08-29 NOTE — Progress Notes (Signed)
HPI: Barbara Calderon returns today status post right total knee arthroplasty 06/22/2022.  She is overall doing well.  She continues to work on range of motion and strengthening.  She still having some muscle spasm at night.  She is taking tramadol and for another problem that was prescribed by another physician.  She did run out of her methocarbamol.  She does take Lasix periodically but does take potassium replacement.  Physical exam: General well-developed well-nourished female no acute distress.  Mood affect appropriate. Right knee full extension flexion 110 degrees.  No instability valgus varus stressing.  Calf supple nontender.  Surgical incisions well-healed.  Impression: Status post right total knee arthroplasty  Plan: She will continue to work on range of motion strengthening the knee with therapy.  Continue work on scar tissue mobilization.  Will send her in some Zanaflex to see if this helps with his muscle spasm at night.  Questions were encouraged and answered at length we will see her back in approximately 3 and half months sooner if there is any questions concerns.  AP and lateral views of the right knee at that time.

## 2022-08-29 NOTE — Assessment & Plan Note (Signed)
Known ankle osteoarthritis, tibiotalar and subtalar, we did an ankle joint injection with ultrasound guidance back in August 2023 that really did not give her much relief, we will go ahead and apply an ASO, and I would like to get her in with foot and ankle surgery.

## 2022-08-29 NOTE — Progress Notes (Signed)
    Procedures performed today:    None.  Independent interpretation of notes and tests performed by another provider:   None.  Brief History, Exam, Impression, and Recommendations:    Primary osteoarthritis of left ankle Known ankle osteoarthritis, tibiotalar and subtalar, we did an ankle joint injection with ultrasound guidance back in August 2023 that really did not give her much relief, we will go ahead and apply an ASO, and I would like to get her in with foot and ankle surgery.  Lumbar spondylosis Continues to have bilateral lower extremity paresthesias, she did have some axial back pain, x-rays did show multilevel spondylitic processes in the lumbar spine, she is on max tolerated gabapentin, she has done aggressive formal physical therapy and her back pain is gone, she still has paresthesias both feet all toes, and she would like additional workup, I am not sure if this is coming from a radicular source or peripheral neuropathy so I would like a nerve conduction and EMG.    ____________________________________________ [name]. [name], M.D., ABFM., CAQSM., AME. Primary Care and Sports Medicine [facility]  Adjunct Professor of [employer]  Risk manager

## 2022-08-30 ENCOUNTER — Ambulatory Visit: Payer: Medicare HMO | Admitting: Physical Therapy

## 2022-09-04 ENCOUNTER — Ambulatory Visit: Payer: Medicare HMO | Admitting: Physical Therapy

## 2022-09-04 ENCOUNTER — Encounter: Payer: Self-pay | Admitting: Physical Therapy

## 2022-09-04 DIAGNOSIS — R600 Localized edema: Secondary | ICD-10-CM | POA: Diagnosis not present

## 2022-09-04 DIAGNOSIS — R2689 Other abnormalities of gait and mobility: Secondary | ICD-10-CM | POA: Diagnosis not present

## 2022-09-04 DIAGNOSIS — M6281 Muscle weakness (generalized): Secondary | ICD-10-CM | POA: Diagnosis not present

## 2022-09-04 DIAGNOSIS — M25561 Pain in right knee: Secondary | ICD-10-CM | POA: Diagnosis not present

## 2022-09-04 NOTE — Therapy (Addendum)
OUTPATIENT PHYSICAL THERAPY LOWER EXTREMITY TREATMENT AND RE-CERT   Patient Name: Barbara Calderon MRN: [id number] DOB:06-19-1966, 57 y.o., female Today's Date: 09/04/2022  END OF SESSION:  PT End of Session - 09/04/22 1529     Visit Number 12    Number of Visits 16    Date for PT Re-Evaluation 09/04/22    Authorization Type [employer]    PT Start Time 8756    PT Stop Time 1610    PT Time Calculation (min) 40 min    Activity Tolerance Patient tolerated treatment well    Behavior During Therapy WFL for tasks assessed/performed               Past Medical History:  Diagnosis Date   Abnormal mammogram    Abnormal Pap smear of cervix    Anxiety    Arthritis    Asthma    Depression    GERD (gastroesophageal reflux disease)    Hashimoto's disease    Heart murmur    Echo was good per pt.   Hyperlipidemia    borderline- no meds   Hypothyroidism    Lymphedema    Lymphedema    bilateral legs   Lymphedema    legs   Neuromuscular disorder (HCC)    fibro many years ago told this, neurpathy legs   Pneumonia    PONV (postoperative nausea and vomiting)    slow to wake up   Sleep apnea    mild no cpap   Thyroid disease    Hashimotos   Varicose veins    Past Surgical History:  Procedure Laterality Date   ABDOMINAL HYSTERECTOMY     partial   APPLICATION OF WOUND VAC     on left lower leg above foot   BLADDER SUSPENSION     CESAREAN SECTION     x2   CHOLECYSTECTOMY OPEN     INCISION AND DRAINAGE OF WOUND     PARTIAL HYSTERECTOMY     SKIN GRAFT     TOTAL KNEE ARTHROPLASTY Right 06/22/2022   Procedure: RIGHT TOTAL KNEE ARTHROPLASTY;  Surgeon: Mcarthur Rossetti, MD;  Location: [facility];  Service: Orthopedics;  Laterality: Right;   TUBAL LIGATION     Patient Active Problem List   Diagnosis Date Noted   Lumbar spondylosis 07/18/2022   Status post total right knee replacement 06/22/2022   Preoperative clearance 04/11/2022   Primary osteoarthritis of left ankle  12/12/2021   Abnormal finding on MRI of brain 08/03/2021   Chronic intractable headache 08/03/2021   Mild sleep apnea 04/15/2019   Chronic venous insufficiency 11/12/2014   Family history of DVT 11/12/2014   Lymphedema 11/12/2014   Vaginal vault prolapse 11/06/2013   SUI (stress urinary incontinence, female) 08/03/2013   Obesity, morbid, BMI 40.0-49.9 ([place]) 08/03/2013   Cystocele 08/03/2013   Asthma 09/20/2011   Depression 08/22/2011   History of gastroesophageal reflux (GERD) 08/22/2011   SHINGLES 08/08/2007   Hypothyroidism 05/22/2007   POLYARTHRALGIA 05/22/2007   LEG EDEMA 05/22/2007   URINARY FREQUENCY 05/22/2007    PCP: Samuel Bouche, NP  REFERRING PROVIDER: Jean Rosenthal, MD  REFERRING DIAG: s/p R TKR  THERAPY DIAG:  Muscle weakness (generalized)  Acute pain of right knee  Other abnormalities of gait and mobility  Localized edema  Rationale for Evaluation and Treatment: Rehabilitation  ONSET DATE: 06/22/2022  SUBJECTIVE:   SUBJECTIVE STATEMENT: Pt reports Dr. Darene Lamer put her in an ankle brace and referred her to a foot/ankle surgeon for her L  LE. Pt saw ortho PA for her knee and thought things looked good. Pt reports she had a stomach bug this weekend. Did not do all the exercises this weekend and can tell it is tired. Pt states she has been able to do the scar massage.   PERTINENT HISTORY: GERD, HLD, lymphedema, fibromyalgia, h/o imbalance (vision, hearing changes)-- MS ruled out. H/o migraines. PAIN:  Are you having pain? Yes: NPRS scale: 0/10 Pain location: R knee Pain description: much improved since eval Aggravating factors: ROM in therapy Relieving factors: meds  6/10 rib pain, eases with arms still  10/10 on L knee with palpation  PRECAUTIONS: None  WEIGHT BEARING RESTRICTIONS: No  FALLS:  Has patient fallen in last 6 months? Yes. Number of falls estimates 6  LIVING ENVIRONMENT: Lives with: lives with their family *She is working to adopt a  58 year old and is primary caregiver for 58year old. Lives in: House/apartment Stairs: Yes: External: 4 steps; has rail Has following equipment at home: Gilford Rile - 2 wheeled; used a cane in the past  OCCUPATION: retired Scientific laboratory technician; on disability due to problems with lymphedema  PLOF: Independent  PATIENT GOALS: Improve mobility and stability. "I want to be able to walk."  NEXT MD VISIT:  in 4 weeks  OBJECTIVE:   PATIENT SURVEYS:  FOTO 52% (goal to 77%)  SENSATION: Some h/o abnormal sensation in her L toes (may be from prior meds)  EDEMA:  Has a h/o lymphedema Circumferential measurements: L 19.5" and R 21.0" Incision is well approximated and clean/dry with steri-strips intact  POSTURE: Maintains mild R knee flexion in standing  PALPATION: Tenderness over the lateral distal aspect of the knee  LOWER EXTREMITY ROM:  AROM Right eval Left eval Right 07/31/22 Right 08/07/22 Right 08/28/22  Knee flexion 85 115 98 105 108  Knee extension -18  -9 -8 -5                    PROM       Knee flexion 92   110 110  Knee extension -12    0   LOWER EXTREMITY MMT: >3/5 throughout LEs   FUNCTIONAL TESTS:  5 times sit to stand: 22.18 seconds without UEs  (from eval) 5 times sit to stand: 16 sec without UEs (08/28/22)  Gait speed=20 ft/8.08 seconds=2.48 ft/sec  GAIT: Distance walked: 100 Assistive device utilized: Environmental consultant - 2 wheeled Level of assistance: Modified independence Comments: R knee maintains mild flexion with foot flat Stairs: mod indep step to pattern with bilat handrails x 4 steps  [facility] Adult PT Treatment:                                                DATE: 09/04/22 Therapeutic Exercise: Recumbent bike half revolutions x 3 min, full revolutions x 3 min Quadruped sit hips to heels for knee flexion 3x30 sec Prone quad stretch with strap 2x30 sec Prone hamstring curl with AAROM at end range knee flexion x10 Prone hip ext 2x10 Prone hip abd 2x10 Eccentric  step down 4" step 2x10 bilat UE support SLS on R 2x30 sec intermittent UE support Manual Therapy: PROM knee flex/ext   [facility] Adult PT Treatment:  DATE: 08/28/22 Therapeutic Exercise: Recumbent bike half revolutions x 3 min, full revolutions x 3 min Quad set with over pressure in supine  Manual Therapy: Knee flexion/ext PROM Grade II to III knee mobilization for flex/ext Scar massage Therapeutic Activity: 5x STS = 16 sec 8 laps around gym no a/d working on activity = 600' Ascend/descend 10 steps with 1 HR on L    PATIENT EDUCATION:  Education details: HEP Person educated: Patient Education method: Consulting civil engineer, Demonstration, and Handouts Education comprehension: verbalized understanding and returned demonstration  HOME EXERCISE PROGRAM: Access Code: [email or web address] URL: [email or web address] Date: 08/09/2022 Prepared by: Helane Gunther  Exercises - Quad Setting and Stretching  - 2 x daily - 7 x weekly - 1 sets - 10 reps - Supine Active Straight Leg Raise  - 1 x daily - 7 x weekly - 3 sets - 15 reps - Supine Heel Slides  - 1 x daily - 7 x weekly - 3 sets - 15 reps - Sidelying Hip Abduction  - 2 x daily - 7 x weekly - 1 sets - 10 reps - Seated Long Arc Quad  - 1 x daily - 7 x weekly - 3 sets - 10 reps - Seated Knee Flexion Stretch  - 1 x daily - 7 x weekly - 3 sets - 10 reps - Long Sitting Ankle Pumps  - 1 x daily - 7 x weekly - 3 sets - 10 reps - Seated Pelvic Floor Contraction with Isometric Hip Adduction  - 1 x daily - 7 x weekly - 3 sets - 10 reps - 5 sec hold - Seated Pelvic Floor Contraction with Hip Abduction and Resistance Loop  - 1 x daily - 7 x weekly - 3 sets - 10 reps - Gastroc Stretch with Foot at Wall  - 1 x daily - 7 x weekly - 2 sets - 30 sec hold - Standing Soleus Stretch on Step  - 1 x daily - 7 x weekly - 2 sets - 30 sec hold - Supine Quadriceps Stretch with Strap on Table  - 1 x daily - 7 x weekly - 2 sets  - 30 sec hold - Prone Terminal Knee Extension  - 1 x daily - 7 x weekly - 3 sets - 10 reps - Prone Knee Flexion  - 1 x daily - 7 x weekly - 3 sets - 10 reps - Standing Balance in Corner with Eyes Closed  - 1 x daily - 7 x weekly - 1 sets - 5 reps - 30 sec hold - Seated Knee Extension with Resistance  - 1 x daily - 7 x weekly - 3 sets - 10 reps - Seated Hamstring Curls with Resistance  - 1 x daily - 7 x weekly - 3 sets - 10 reps - Sit to Stand  - 1 x daily - 7 x weekly - 3 sets - 10 reps  Patient Education - Scar Massage  ASSESSMENT:  CLINICAL IMPRESSION: Session focused on gross R LE strengthening. Worked on eccentric step downs for improved knee stability. Performed PNF for improved R knee flexion. Re-checked pt's goals -- continues to be limited with knee flexion and safe stair ascent/descent. Discussed continuing PT 1x/wk for an additional 4 weeks to work on these issues.    OBJECTIVE IMPAIRMENTS: Abnormal gait, decreased activity tolerance, decreased balance, difficulty walking, decreased ROM, decreased strength, increased edema, increased fascial restrictions, and pain.    GOALS: Goals reviewed with patient? Yes   SHORT TERM GOALS: Target  date: 08/03/2022    Independent with initial HEP. Baseline: Has HEP from [facility]-- PT progressed today. Goal status: INITIAL 1/25: MET  2.  The patient will improve AROM R knee to 104 degrees flexion.  Baseline: 85 degrees Goal status: MET  3.  The patient will improve AROM R knee to -10 degrees during LAQ. Baseline: -18 degrees Goal status: MET   REVISED LONG TERM GOALS: Target date: 10/02/2022   Independent with advanced/ongoing HEP to improve outcomes and carryover.  Baseline: has initial HEP Goal status: MET  2.  Patient will demonstrate right knee flexion to 115 deg to ascend/descend stairs. Baseline: 85 degrees AROM 110 deg (08/28/22) Goal status: IN PROGRESS  3.  Patient will demonstrate full right knee extension for safety  with gait. Baseline: -18 during LAQ -5 (08/28/22) Goal status: MET  4.  Patient will be able to ambulate 500' safely with LRAD and normal gait pattern to access community.  Baseline: 100 + ft with RW mod indep 600' ind (08/28/22) Goal status: MET  5.  Patient will be able to ascend/descend stairs with 1 HR and reciprocal step pattern safely to access home and community.  Baseline: step to pattern with bilat Hrs Step to pattern with descent (08/28/22) Goal status: IN PROGRESS  7.  Patient will demonstrate improved functional LE strength by completing 5x STS in <17 seconds.  (MCID 5 seconds) Baseline: 22.18 seconds 16 sec (08/28/22) Goal status: MET  PLAN:  PT FREQUENCY: 1x/week  PT DURATION: 4 weeks  PLANNED INTERVENTIONS: Therapeutic exercises, Therapeutic activity, Neuromuscular re-education, Balance training, Gait training, Patient/Family education, Self Care, Joint mobilization, Stair training, DME instructions, Aquatic Therapy, Dry Needling, Electrical stimulation, Cryotherapy, Moist heat, scar mobilization, Taping, Vasopneumatic device, and Manual therapy  PLAN FOR NEXT SESSION: Knee AROM/PROM, gait training reducing reliance on RW, pain mgmt and scar tissue massage when indicated.   Enrique Weiss April Ma L Whitecone, PT 09/04/2022, 4:12 PM

## 2022-09-12 ENCOUNTER — Encounter: Payer: Medicaid Other | Admitting: Physical Therapy

## 2022-09-12 DIAGNOSIS — M14672 Charcot's joint, left ankle and foot: Secondary | ICD-10-CM | POA: Diagnosis not present

## 2022-09-13 ENCOUNTER — Encounter: Payer: Self-pay | Admitting: Radiology

## 2022-09-13 ENCOUNTER — Telehealth: Payer: Self-pay | Admitting: *Deleted

## 2022-09-13 NOTE — Telephone Encounter (Signed)
Ortho bundle 90 day call completed. 

## 2022-09-17 ENCOUNTER — Other Ambulatory Visit: Payer: Self-pay | Admitting: Student

## 2022-09-17 ENCOUNTER — Encounter: Payer: Self-pay | Admitting: Student

## 2022-09-17 DIAGNOSIS — M13872 Other specified arthritis, left ankle and foot: Secondary | ICD-10-CM

## 2022-09-19 ENCOUNTER — Ambulatory Visit: Payer: Medicare HMO | Attending: Orthopaedic Surgery | Admitting: Physical Therapy

## 2022-09-19 ENCOUNTER — Encounter: Payer: Self-pay | Admitting: Physical Therapy

## 2022-09-19 DIAGNOSIS — R2689 Other abnormalities of gait and mobility: Secondary | ICD-10-CM | POA: Insufficient documentation

## 2022-09-19 DIAGNOSIS — M6281 Muscle weakness (generalized): Secondary | ICD-10-CM | POA: Insufficient documentation

## 2022-09-19 DIAGNOSIS — M25561 Pain in right knee: Secondary | ICD-10-CM | POA: Diagnosis not present

## 2022-09-19 DIAGNOSIS — R600 Localized edema: Secondary | ICD-10-CM | POA: Insufficient documentation

## 2022-09-19 NOTE — Therapy (Addendum)
OUTPATIENT PHYSICAL THERAPY LOWER EXTREMITY TREATMENT AND DISCHARGE   Patient Name: Barbara Calderon MRN: [id number] DOB:09-Jul-1966, 57 y.o., female Today's Date: 09/19/2022  END OF SESSION:  PT End of Session - 09/19/22 1444     Visit Number 13    Number of Visits 16    Date for PT Re-Evaluation 10/02/22    PT Start Time 1415   pt arrived late   PT Stop Time 1444    PT Time Calculation (min) 29 min    Activity Tolerance Patient tolerated treatment well    Behavior During Therapy WFL for tasks assessed/performed                Past Medical History:  Diagnosis Date   Abnormal mammogram    Abnormal Pap smear of cervix    Anxiety    Arthritis    Asthma    Depression    GERD (gastroesophageal reflux disease)    Hashimoto's disease    Heart murmur    Echo was good per pt.   Hyperlipidemia    borderline- no meds   Hypothyroidism    Lymphedema    Lymphedema    bilateral legs   Lymphedema    legs   Neuromuscular disorder (HCC)    fibro many years ago told this, neurpathy legs   Pneumonia    PONV (postoperative nausea and vomiting)    slow to wake up   Sleep apnea    mild no cpap   Thyroid disease    Hashimotos   Varicose veins    Past Surgical History:  Procedure Laterality Date   ABDOMINAL HYSTERECTOMY     partial   APPLICATION OF WOUND VAC     on left lower leg above foot   BLADDER SUSPENSION     CESAREAN SECTION     x2   CHOLECYSTECTOMY OPEN     INCISION AND DRAINAGE OF WOUND     PARTIAL HYSTERECTOMY     SKIN GRAFT     TOTAL KNEE ARTHROPLASTY Right 06/22/2022   Procedure: RIGHT TOTAL KNEE ARTHROPLASTY;  Surgeon: [name], MD;  Location: [facility];  Service: Orthopedics;  Laterality: Right;   TUBAL LIGATION     Patient Active Problem List   Diagnosis Date Noted   Lumbar spondylosis 07/18/2022   Status post total right knee replacement 06/22/2022   Preoperative clearance 04/11/2022   Primary osteoarthritis of left ankle 12/12/2021    Abnormal finding on MRI of brain 08/03/2021   Chronic intractable headache 08/03/2021   Mild sleep apnea 04/15/2019   Chronic venous insufficiency 11/12/2014   Family history of DVT 11/12/2014   Lymphedema 11/12/2014   Vaginal vault prolapse 11/06/2013   SUI (stress urinary incontinence, female) 08/03/2013   Obesity, morbid, BMI 40.0-49.9 (HCC) 08/03/2013   Cystocele 08/03/2013   Asthma 09/20/2011   Depression 08/22/2011   History of gastroesophageal reflux (GERD) 08/22/2011   SHINGLES 08/08/2007   Hypothyroidism 05/22/2007   POLYARTHRALGIA 05/22/2007   LEG EDEMA 05/22/2007   URINARY FREQUENCY 05/22/2007    PCP: Christen Butter, NP  REFERRING PROVIDER: Doneen Poisson, MD  REFERRING DIAG: s/p R TKR  THERAPY DIAG:  Muscle weakness (generalized)  Other abnormalities of gait and mobility  Acute pain of right knee  Localized edema  Rationale for Evaluation and Treatment: Rehabilitation  ONSET DATE: 06/22/2022  SUBJECTIVE:   SUBJECTIVE STATEMENT: Pt states she has a Charcot fracture and is currently in Lt ankle boot. She states she I supposed to be  using a knee scooter but is ambulating into clinic today without device.   PERTINENT HISTORY: GERD, HLD, lymphedema, fibromyalgia, h/o imbalance (vision, hearing changes)-- MS ruled out. H/o migraines. PAIN:  Are you having pain? Yes: NPRS scale: 0/10 Pain location: R knee Pain description: much improved since eval Aggravating factors: ROM in therapy Relieving factors: meds  6/10 rib pain, eases with arms still  10/10 on L knee with palpation  PRECAUTIONS: None  WEIGHT BEARING RESTRICTIONS: No  FALLS:  Has patient fallen in last 6 months? Yes. Number of falls estimates 6  LIVING ENVIRONMENT: Lives with: lives with their family *She is working to adopt a 58 year old and is primary caregiver for 58year old. Lives in: House/apartment Stairs: Yes: External: 4 steps; has rail Has following equipment at home: Dan Humphreys  - 2 wheeled; used a cane in the past  OCCUPATION: retired Arts administrator; on disability due to problems with lymphedema  PLOF: Independent  PATIENT GOALS: Improve mobility and stability. "I want to be able to walk."  NEXT MD VISIT:  in 4 weeks  OBJECTIVE:   PATIENT SURVEYS:  FOTO 52% (goal to 77%)  SENSATION: Some h/o abnormal sensation in her L toes (may be from prior meds)  EDEMA:  Has a h/o lymphedema Circumferential measurements: L 19.5" and R 21.0" Incision is well approximated and clean/dry with steri-strips intact  POSTURE: Maintains mild R knee flexion in standing  PALPATION: Tenderness over the lateral distal aspect of the knee  LOWER EXTREMITY ROM:  AROM Right eval Left eval Right 07/31/22 Right 08/07/22 Right 08/28/22 Right 09/19/22  Knee flexion 85 115 98 105 108 110  Knee extension -18  -9 -8 -5 -3                    PROM        Knee flexion 92   110 110   Knee extension -12    0    LOWER EXTREMITY MMT: >3/5 throughout LEs   FUNCTIONAL TESTS:  5 times sit to stand: 22.18 seconds without UEs  (from eval) 5 times sit to stand: 16 sec without UEs (08/28/22)  Gait speed=20 ft/8.08 seconds=2.48 ft/sec  GAIT: Distance walked: 100 Assistive device utilized: Environmental consultant - 2 wheeled Level of assistance: Modified independence Comments: R knee maintains mild flexion with foot flat Stairs: mod indep step to pattern with bilat handrails x 4 steps  [facility] Adult PT Treatment:                                                DATE: 09/19/22 Therapeutic Exercise: Nustep L6 x 6 min for AAROM and warm up Supine heel slides x 10 Prone HS curl 2 x 10 Prone hip ext 2 x 10 Prone hip abd 2 x 10 Quad set supine 5 x 5 sec SLR 2 x 10 Manual Therapy: PROM knee flexion    [facility] Adult PT Treatment:                                                DATE: 09/04/22 Therapeutic Exercise: Recumbent bike half revolutions x 3 min, full revolutions x 3 min Quadruped sit hips to  heels for knee flexion 3x30  sec Prone quad stretch with strap 2x30 sec Prone hamstring curl with AAROM at end range knee flexion x10 Prone hip ext 2x10 Prone hip abd 2x10 Eccentric step down 4" step 2x10 bilat UE support SLS on R 2x30 sec intermittent UE support Seated knee flexion ROM 10 x 10 sec Manual Therapy: PROM knee flex/ext   [facility] Adult PT Treatment:                                                DATE: 08/28/22 Therapeutic Exercise: Recumbent bike half revolutions x 3 min, full revolutions x 3 min Quad set with over pressure in supine  Manual Therapy: Knee flexion/ext PROM Grade II to III knee mobilization for flex/ext Scar massage Therapeutic Activity: 5x STS = 16 sec 8 laps around gym no a/d working on activity = 600' Ascend/descend 10 steps with 1 HR on L    PATIENT EDUCATION:  Education details: HEP Person educated: Patient Education method: Explanation, Demonstration, and Handouts Education comprehension: verbalized understanding and returned demonstration  HOME EXERCISE PROGRAM: Access Code: [email or web address] URL: [email or web address] Date: 08/09/2022 Prepared by: Carlynn Herald  Exercises - Quad Setting and Stretching  - 2 x daily - 7 x weekly - 1 sets - 10 reps - Supine Active Straight Leg Raise  - 1 x daily - 7 x weekly - 3 sets - 15 reps - Supine Heel Slides  - 1 x daily - 7 x weekly - 3 sets - 15 reps - Sidelying Hip Abduction  - 2 x daily - 7 x weekly - 1 sets - 10 reps - Seated Long Arc Quad  - 1 x daily - 7 x weekly - 3 sets - 10 reps - Seated Knee Flexion Stretch  - 1 x daily - 7 x weekly - 3 sets - 10 reps - Long Sitting Ankle Pumps  - 1 x daily - 7 x weekly - 3 sets - 10 reps - Seated Pelvic Floor Contraction with Isometric Hip Adduction  - 1 x daily - 7 x weekly - 3 sets - 10 reps - 5 sec hold - Seated Pelvic Floor Contraction with Hip Abduction and Resistance Loop  - 1 x daily - 7 x weekly - 3 sets - 10 reps - Gastroc Stretch with Foot at  Wall  - 1 x daily - 7 x weekly - 2 sets - 30 sec hold - Standing Soleus Stretch on Step  - 1 x daily - 7 x weekly - 2 sets - 30 sec hold - Supine Quadriceps Stretch with Strap on Table  - 1 x daily - 7 x weekly - 2 sets - 30 sec hold - Prone Terminal Knee Extension  - 1 x daily - 7 x weekly - 3 sets - 10 reps - Prone Knee Flexion  - 1 x daily - 7 x weekly - 3 sets - 10 reps - Standing Balance in Corner with Eyes Closed  - 1 x daily - 7 x weekly - 1 sets - 5 reps - 30 sec hold - Seated Knee Extension with Resistance  - 1 x daily - 7 x weekly - 3 sets - 10 reps - Seated Hamstring Curls with Resistance  - 1 x daily - 7 x weekly - 3 sets - 10 reps - Sit to Stand  - 1 x daily - 7 x  weekly - 3 sets - 10 reps  Patient Education - Scar Massage  ASSESSMENT:  CLINICAL IMPRESSION: Due to new onset fracture session focused on NWB knee ROM and strengthening. Pt improving ROM for flexion and extension. Encouraged her to maintain AROM while recovering from ankle injury   OBJECTIVE IMPAIRMENTS: Abnormal gait, decreased activity tolerance, decreased balance, difficulty walking, decreased ROM, decreased strength, increased edema, increased fascial restrictions, and pain.    GOALS: Goals reviewed with patient? Yes   SHORT TERM GOALS: Target date: 08/03/2022    Independent with initial HEP. Baseline: Has HEP from [facility]-- PT progressed today. Goal status: INITIAL 1/25: MET  2.  The patient will improve AROM R knee to 104 degrees flexion.  Baseline: 85 degrees Goal status: MET  3.  The patient will improve AROM R knee to -10 degrees during LAQ. Baseline: -18 degrees Goal status: MET   REVISED LONG TERM GOALS: Target date: 10/02/2022   Independent with advanced/ongoing HEP to improve outcomes and carryover.  Baseline: has initial HEP Goal status: MET  2.  Patient will demonstrate right knee flexion to 115 deg to ascend/descend stairs. Baseline: 85 degrees AROM 110 deg (08/28/22) Goal status: IN  PROGRESS  3.  Patient will demonstrate full right knee extension for safety with gait. Baseline: -18 during LAQ -5 (08/28/22) Goal status: MET  4.  Patient will be able to ambulate 500' safely with LRAD and normal gait pattern to access community.  Baseline: 100 + ft with RW mod indep 600' ind (08/28/22) Goal status: MET  5.  Patient will be able to ascend/descend stairs with 1 HR and reciprocal step pattern safely to access home and community.  Baseline: step to pattern with bilat Hrs Step to pattern with descent (08/28/22) Goal status: IN PROGRESS  7.  Patient will demonstrate improved functional LE strength by completing 5x STS in <17 seconds.  (MCID 5 seconds) Baseline: 22.18 seconds 16 sec (08/28/22) Goal status: MET  PLAN:  PT FREQUENCY: 1x/week  PT DURATION: 4 weeks  PLANNED INTERVENTIONS: Therapeutic exercises, Therapeutic activity, Neuromuscular re-education, Balance training, Gait training, Patient/Family education, Self Care, Joint mobilization, Stair training, DME instructions, Aquatic Therapy, Dry Needling, Electrical stimulation, Cryotherapy, Moist heat, scar mobilization, Taping, Vasopneumatic device, and Manual therapy  PLAN FOR NEXT SESSION: Knee AROM/PROM, stair training when able  PHYSICAL THERAPY DISCHARGE SUMMARY  Visits from Start of Care: 13  Current functional level related to goals / functional outcomes: Improved strength, ROM, mobility   Remaining deficits: See above   Education / Equipment: HEP   Patient agrees to discharge. Patient goals were partially met. Patient is being discharged due to a change in medical status.  Reggy Eye, PT,DPT04/30/241:43 PM   October Peery, PT 09/19/2022, 2:45 PM

## 2022-09-19 NOTE — Addendum Note (Signed)
Addended by: Malachy Moan L on: 09/19/2022 02:33 PM   Modules accepted: Orders

## 2022-09-21 ENCOUNTER — Ambulatory Visit
Admission: RE | Admit: 2022-09-21 | Discharge: 2022-09-21 | Disposition: A | Discharge status: Home / Self Care | Payer: Medicare HMO | Source: Ambulatory Visit | Attending: Student

## 2022-09-21 DIAGNOSIS — M62572 Muscle wasting and atrophy, not elsewhere classified, left ankle and foot: Secondary | ICD-10-CM | POA: Diagnosis not present

## 2022-09-21 DIAGNOSIS — M13872 Other specified arthritis, left ankle and foot: Secondary | ICD-10-CM

## 2022-09-21 DIAGNOSIS — R600 Localized edema: Secondary | ICD-10-CM | POA: Diagnosis not present

## 2022-09-21 DIAGNOSIS — M810 Age-related osteoporosis without current pathological fracture: Secondary | ICD-10-CM | POA: Diagnosis not present

## 2022-09-21 DIAGNOSIS — M19072 Primary osteoarthritis, left ankle and foot: Secondary | ICD-10-CM | POA: Diagnosis not present

## 2022-10-03 DIAGNOSIS — M6702 Short Achilles tendon (acquired), left ankle: Secondary | ICD-10-CM | POA: Diagnosis not present

## 2022-10-03 DIAGNOSIS — M14679 Charcot's joint, unspecified ankle and foot: Secondary | ICD-10-CM | POA: Diagnosis not present

## 2022-10-03 DIAGNOSIS — S93315A Dislocation of tarsal joint of left foot, initial encounter: Secondary | ICD-10-CM | POA: Diagnosis not present

## 2022-10-03 DIAGNOSIS — M13872 Other specified arthritis, left ankle and foot: Secondary | ICD-10-CM | POA: Diagnosis not present

## 2022-10-04 ENCOUNTER — Other Ambulatory Visit (HOSPITAL_COMMUNITY): Payer: Self-pay | Admitting: Orthopedic Surgery

## 2022-10-11 ENCOUNTER — Ambulatory Visit: Payer: Medicare Other | Admitting: Primary Care

## 2022-10-17 ENCOUNTER — Encounter (HOSPITAL_BASED_OUTPATIENT_CLINIC_OR_DEPARTMENT_OTHER): Payer: Self-pay | Admitting: Orthopedic Surgery

## 2022-10-17 ENCOUNTER — Other Ambulatory Visit: Payer: Self-pay

## 2022-10-18 ENCOUNTER — Ambulatory Visit (INDEPENDENT_AMBULATORY_CARE_PROVIDER_SITE_OTHER): Payer: Medicare HMO | Admitting: Medical-Surgical

## 2022-10-18 ENCOUNTER — Encounter: Payer: Self-pay | Admitting: Medical-Surgical

## 2022-10-18 VITALS — BP 99/68 | HR 60 | Resp 20 | Ht 64.0 in | Wt 241.4 lb

## 2022-10-18 DIAGNOSIS — M14679 Charcot's joint, unspecified ankle and foot: Secondary | ICD-10-CM | POA: Diagnosis not present

## 2022-10-18 DIAGNOSIS — Z01818 Encounter for other preprocedural examination: Secondary | ICD-10-CM

## 2022-10-18 MED ORDER — OXYBUTYNIN CHLORIDE ER 10 MG PO TB24: 10.0000 mg | ORAL_TABLET | Freq: Every day | ORAL | 1 refills | Status: DC

## 2022-10-18 NOTE — Progress Notes (Signed)
        Established patient visit  History, exam, impression, and plan:  1. Preoperative clearance Pleasant 57 year old female presenting today for evaluation for surgical clearance.  Notes that she has had continued issues with her left foot/ankle.  She was seen by Dr. Benjamin Stain who had attempted multiple treatments.  Unfortunately these were unsuccessful.  She was referred to Dr. Victorino Dike who diagnosed her with Charcot foot and has recommended surgical intervention.  Recommendations to stop aspirin and meloxicam were already in place.  She is up-to-date on recent lab work with no significant concerns.  Activity limited by pain however she is able to perform most activities without dyspnea or shortness of breath.  She does have an diagnosis of mild sleep apnea but this is managed with conservative measures and she does not need a CPAP.  On exam, HRR, normal S1/S2.  Mild peripheral edema of the right lower extremity, chronic lymphedema.  Blood pressure well-controlled with no CP, SOB, palpitations, dizziness, or HA.  Reports being able to climb several flights of stairs with only mild shortness of breath.  Overall assessment is reassuring and she is optimized medically and cardiovascularly.  Surgical clearance form completed and will be faxed to Dr. Laverta Baltimore office for her upcoming procedure on 10/25/2022.  Procedures performed this visit: None.  Return if symptoms worsen or fail to improve.  __________________________________ Thayer Ohm, DNP, APRN, FNP-BC Primary Care and Sports Medicine [facility] [place]

## 2022-10-22 ENCOUNTER — Other Ambulatory Visit: Payer: Self-pay | Admitting: Medical-Surgical

## 2022-10-23 ENCOUNTER — Ambulatory Visit: Payer: Medicaid Other | Admitting: Medical-Surgical

## 2022-10-25 ENCOUNTER — Ambulatory Visit (HOSPITAL_BASED_OUTPATIENT_CLINIC_OR_DEPARTMENT_OTHER): Payer: Medicare HMO

## 2022-10-25 ENCOUNTER — Encounter (HOSPITAL_BASED_OUTPATIENT_CLINIC_OR_DEPARTMENT_OTHER): Payer: Self-pay | Admitting: Orthopedic Surgery

## 2022-10-25 ENCOUNTER — Other Ambulatory Visit: Payer: Self-pay

## 2022-10-25 ENCOUNTER — Ambulatory Visit (HOSPITAL_BASED_OUTPATIENT_CLINIC_OR_DEPARTMENT_OTHER)
Admission: RE | Admit: 2022-10-25 | Discharge: 2022-10-25 | Disposition: A | Discharge status: Home / Self Care | Payer: Medicare HMO | Attending: Orthopedic Surgery | Admitting: Orthopedic Surgery

## 2022-10-25 ENCOUNTER — Ambulatory Visit (HOSPITAL_BASED_OUTPATIENT_CLINIC_OR_DEPARTMENT_OTHER): Payer: Medicare HMO | Admitting: Anesthesiology

## 2022-10-25 ENCOUNTER — Encounter (HOSPITAL_BASED_OUTPATIENT_CLINIC_OR_DEPARTMENT_OTHER)
Admission: RE | Disposition: A | Discharge status: Home / Self Care | Payer: Self-pay | Source: Home / Self Care | Attending: Orthopedic Surgery

## 2022-10-25 DIAGNOSIS — E039 Hypothyroidism, unspecified: Secondary | ICD-10-CM | POA: Diagnosis not present

## 2022-10-25 DIAGNOSIS — Z01818 Encounter for other preprocedural examination: Secondary | ICD-10-CM

## 2022-10-25 DIAGNOSIS — M14672 Charcot's joint, left ankle and foot: Secondary | ICD-10-CM

## 2022-10-25 DIAGNOSIS — F418 Other specified anxiety disorders: Secondary | ICD-10-CM | POA: Diagnosis not present

## 2022-10-25 DIAGNOSIS — R69 Illness, unspecified: Secondary | ICD-10-CM | POA: Diagnosis not present

## 2022-10-25 DIAGNOSIS — K219 Gastro-esophageal reflux disease without esophagitis: Secondary | ICD-10-CM | POA: Insufficient documentation

## 2022-10-25 DIAGNOSIS — G609 Hereditary and idiopathic neuropathy, unspecified: Secondary | ICD-10-CM | POA: Insufficient documentation

## 2022-10-25 DIAGNOSIS — G8918 Other acute postprocedural pain: Secondary | ICD-10-CM | POA: Diagnosis not present

## 2022-10-25 DIAGNOSIS — M6702 Short Achilles tendon (acquired), left ankle: Secondary | ICD-10-CM

## 2022-10-25 DIAGNOSIS — Z6841 Body Mass Index (BMI) 40.0 and over, adult: Secondary | ICD-10-CM | POA: Diagnosis not present

## 2022-10-25 DIAGNOSIS — J45909 Unspecified asthma, uncomplicated: Secondary | ICD-10-CM | POA: Insufficient documentation

## 2022-10-25 DIAGNOSIS — S9305XA Dislocation of left ankle joint, initial encounter: Secondary | ICD-10-CM | POA: Diagnosis not present

## 2022-10-25 DIAGNOSIS — G4730 Sleep apnea, unspecified: Secondary | ICD-10-CM | POA: Insufficient documentation

## 2022-10-25 DIAGNOSIS — S93315A Dislocation of tarsal joint of left foot, initial encounter: Secondary | ICD-10-CM | POA: Diagnosis not present

## 2022-10-25 HISTORY — PX: FOOT ARTHRODESIS: SHX1655

## 2022-10-25 HISTORY — PX: ACHILLES TENDON SURGERY: SHX542

## 2022-10-25 HISTORY — PX: OPEN REDUCTION INTERNAL FIXATION (ORIF) FOOT LISFRANC FRACTURE: SHX5990

## 2022-10-25 SURGERY — REPAIR, TENDON, ACHILLES
Anesthesia: Regional | Site: Foot | Laterality: Left

## 2022-10-25 MED ORDER — PROPOFOL 10 MG/ML IV BOLUS
INTRAVENOUS | Status: DC | PRN
  Administered 2022-10-25: 200 mg via INTRAVENOUS

## 2022-10-25 MED ORDER — PROPOFOL 10 MG/ML IV BOLUS
INTRAVENOUS | Status: AC
  Filled 2022-10-25: qty 20

## 2022-10-25 MED ORDER — LIDOCAINE HCL (CARDIAC) PF 100 MG/5ML IV SOSY
PREFILLED_SYRINGE | INTRAVENOUS | Status: DC | PRN
  Administered 2022-10-25: 60 mg via INTRAVENOUS

## 2022-10-25 MED ORDER — MIDAZOLAM HCL 2 MG/2ML IJ SOLN
INTRAMUSCULAR | Status: AC
  Filled 2022-10-25: qty 2

## 2022-10-25 MED ORDER — ONDANSETRON HCL 4 MG/2ML IJ SOLN
INTRAMUSCULAR | Status: AC
  Filled 2022-10-25: qty 2

## 2022-10-25 MED ORDER — FENTANYL CITRATE (PF) 100 MCG/2ML IJ SOLN
INTRAMUSCULAR | Status: AC
  Filled 2022-10-25: qty 2

## 2022-10-25 MED ORDER — ACETAMINOPHEN 500 MG PO TABS
ORAL_TABLET | ORAL | Status: AC
  Filled 2022-10-25: qty 2

## 2022-10-25 MED ORDER — CEFAZOLIN SODIUM-DEXTROSE 2-4 GM/100ML-% IV SOLN
INTRAVENOUS | Status: AC
  Filled 2022-10-25: qty 100

## 2022-10-25 MED ORDER — DEXAMETHASONE SODIUM PHOSPHATE 10 MG/ML IJ SOLN
INTRAMUSCULAR | Status: DC | PRN
  Administered 2022-10-25: 10 mg via INTRAVENOUS

## 2022-10-25 MED ORDER — ONDANSETRON HCL 4 MG/2ML IJ SOLN
INTRAMUSCULAR | Status: DC | PRN
  Administered 2022-10-25: 4 mg via INTRAVENOUS

## 2022-10-25 MED ORDER — BUPIVACAINE LIPOSOME 1.3 % IJ SUSP
INTRAMUSCULAR | Status: DC | PRN
  Administered 2022-10-25: 10 mL via PERINEURAL

## 2022-10-25 MED ORDER — MIDAZOLAM HCL 2 MG/2ML IJ SOLN
2.0000 mg | Freq: Once | INTRAMUSCULAR | Status: AC
  Administered 2022-10-25: 2 mg via INTRAVENOUS

## 2022-10-25 MED ORDER — OXYCODONE HCL 5 MG PO TABS: 5.0000 mg | ORAL_TABLET | ORAL | 0 refills | Status: AC | PRN

## 2022-10-25 MED ORDER — FENTANYL CITRATE (PF) 100 MCG/2ML IJ SOLN: 25.0000 ug | INTRAMUSCULAR | Status: DC | PRN

## 2022-10-25 MED ORDER — BUPIVACAINE HCL (PF) 0.5 % IJ SOLN
INTRAMUSCULAR | Status: DC | PRN
  Administered 2022-10-25 (×2): 20 mL via PERINEURAL

## 2022-10-25 MED ORDER — ACETAMINOPHEN 500 MG PO TABS
1000.0000 mg | ORAL_TABLET | Freq: Once | ORAL | Status: AC
  Administered 2022-10-25: 1000 mg via ORAL

## 2022-10-25 MED ORDER — MIDAZOLAM HCL 5 MG/5ML IJ SOLN
INTRAMUSCULAR | Status: DC | PRN
  Administered 2022-10-25 (×2): 1 mg via INTRAVENOUS

## 2022-10-25 MED ORDER — EPHEDRINE SULFATE (PRESSORS) 50 MG/ML IJ SOLN
INTRAMUSCULAR | Status: DC | PRN
  Administered 2022-10-25 (×2): 10 mg via INTRAVENOUS

## 2022-10-25 MED ORDER — VANCOMYCIN HCL 500 MG IV SOLR
INTRAVENOUS | Status: DC | PRN
  Administered 2022-10-25: 500 mg via TOPICAL

## 2022-10-25 MED ORDER — 0.9 % SODIUM CHLORIDE (POUR BTL) OPTIME
TOPICAL | Status: DC | PRN
  Administered 2022-10-25: 300 mL

## 2022-10-25 MED ORDER — SODIUM CHLORIDE 0.9 % IV SOLN: INTRAVENOUS | Status: DC

## 2022-10-25 MED ORDER — RIVAROXABAN 10 MG PO TABS: 10.0000 mg | ORAL_TABLET | Freq: Every day | ORAL | 0 refills | Status: DC

## 2022-10-25 MED ORDER — CEFAZOLIN SODIUM-DEXTROSE 2-4 GM/100ML-% IV SOLN
2.0000 g | INTRAVENOUS | Status: AC
  Administered 2022-10-25: 2 g via INTRAVENOUS

## 2022-10-25 MED ORDER — DEXAMETHASONE SODIUM PHOSPHATE 4 MG/ML IJ SOLN
INTRAMUSCULAR | Status: DC | PRN
  Administered 2022-10-25: 5 mg via INTRAVENOUS

## 2022-10-25 MED ORDER — FENTANYL CITRATE (PF) 100 MCG/2ML IJ SOLN
INTRAMUSCULAR | Status: DC | PRN
  Administered 2022-10-25: 50 ug via INTRAVENOUS
  Administered 2022-10-25 (×2): 25 ug via INTRAVENOUS

## 2022-10-25 MED ORDER — LACTATED RINGERS IV SOLN: INTRAVENOUS | Status: DC

## 2022-10-25 MED ORDER — SENNA 8.6 MG PO TABS: 2.0000 | ORAL_TABLET | Freq: Two times a day (BID) | ORAL | 0 refills | Status: DC

## 2022-10-25 MED ORDER — DOCUSATE SODIUM 100 MG PO CAPS: 100.0000 mg | ORAL_CAPSULE | Freq: Two times a day (BID) | ORAL | 0 refills | Status: DC

## 2022-10-25 MED ORDER — FENTANYL CITRATE (PF) 100 MCG/2ML IJ SOLN
100.0000 ug | Freq: Once | INTRAMUSCULAR | Status: AC
  Administered 2022-10-25: 100 ug via INTRAVENOUS

## 2022-10-25 MED ORDER — PHENYLEPHRINE HCL (PRESSORS) 10 MG/ML IV SOLN
INTRAVENOUS | Status: DC | PRN
  Administered 2022-10-25: 80 ug via INTRAVENOUS
  Administered 2022-10-25: 160 ug via INTRAVENOUS

## 2022-10-25 SURGICAL SUPPLY — 112 items
APL PRP STRL LF DISP 70% ISPRP (MISCELLANEOUS) ×1
BANDAGE ESMARK 6X9 LF (GAUZE/BANDAGES/DRESSINGS) IMPLANT
BIT DRILL CAL 2.5 ST W/SLV (BIT) IMPLANT
BIT DRILL CANN 3.5MM (DRILL) IMPLANT
BIT TREPHINE CORING 8 (BIT) IMPLANT
BLADE AVERAGE 25X9 (BLADE) IMPLANT
BLADE MICRO SAGITTAL (BLADE) IMPLANT
BLADE SURG 15 STRL LF DISP TIS (BLADE) ×3 IMPLANT
BLADE SURG 15 STRL SS (BLADE) ×4
BNDG CMPR 5X4 KNIT ELC UNQ LF (GAUZE/BANDAGES/DRESSINGS) ×1
BNDG CMPR 5X62 HK CLSR LF (GAUZE/BANDAGES/DRESSINGS) ×1
BNDG CMPR 6"X 5 YARDS HK CLSR (GAUZE/BANDAGES/DRESSINGS) ×1
BNDG CMPR 9X6 STRL LF SNTH (GAUZE/BANDAGES/DRESSINGS)
BNDG ELASTIC 4INX 5YD STR LF (GAUZE/BANDAGES/DRESSINGS) ×1 IMPLANT
BNDG ELASTIC 6INX 5YD STR LF (GAUZE/BANDAGES/DRESSINGS) ×1 IMPLANT
BNDG ESMARK 6X9 LF (GAUZE/BANDAGES/DRESSINGS)
CANISTER SUCT 1200ML W/VALVE (MISCELLANEOUS) ×1 IMPLANT
CAP PIN PROTECTOR ORTHO WHT (CAP) IMPLANT
CHLORAPREP W/TINT 26 (MISCELLANEOUS) ×1 IMPLANT
COVER BACK TABLE 60X90IN (DRAPES) ×1 IMPLANT
CUFF TOURN SGL QUICK 34 (TOURNIQUET CUFF)
CUFF TRNQT CYL 34X4.125X (TOURNIQUET CUFF) IMPLANT
DRAPE EXTREMITY T 121X128X90 (DISPOSABLE) ×1 IMPLANT
DRAPE OEC MINIVIEW 54X84 (DRAPES) ×1 IMPLANT
DRAPE U-SHAPE 47X51 STRL (DRAPES) ×1 IMPLANT
DRILL CANN 3.5MM (DRILL) ×1
DRSG MEPITEL 4X7.2 (GAUZE/BANDAGES/DRESSINGS) ×1 IMPLANT
ELECT REM PT RETURN 9FT ADLT (ELECTROSURGICAL) ×1
ELECTRODE REM PT RTRN 9FT ADLT (ELECTROSURGICAL) ×1 IMPLANT
GAUZE PAD ABD 8X10 STRL (GAUZE/BANDAGES/DRESSINGS) ×2 IMPLANT
GAUZE SPONGE 4X4 12PLY STRL (GAUZE/BANDAGES/DRESSINGS) ×1 IMPLANT
GLOVE BIOGEL PI IND STRL 6.5 (GLOVE) IMPLANT
GLOVE BIOGEL PI IND STRL 7.0 (GLOVE) IMPLANT
GLOVE BIOGEL PI IND STRL 8 (GLOVE) ×2 IMPLANT
GLOVE SURG SS PI 6.0 STRL IVOR (GLOVE) IMPLANT
GLOVE SURG SS PI 7.0 STRL IVOR (GLOVE) IMPLANT
GLOVE SURG SYN 8.0 (GLOVE) ×3 IMPLANT
GLOVE SURG SYN 8.0 PF PI (GLOVE) ×2 IMPLANT
GOWN STRL REUS W/ TWL LRG LVL3 (GOWN DISPOSABLE) ×1 IMPLANT
GOWN STRL REUS W/ TWL XL LVL3 (GOWN DISPOSABLE) ×2 IMPLANT
GOWN STRL REUS W/TWL LRG LVL3 (GOWN DISPOSABLE) ×2
GOWN STRL REUS W/TWL XL LVL3 (GOWN DISPOSABLE) ×3
K-WIRE 1.8 (WIRE) ×1
K-WIRE DBL .054X4 NSTRL (WIRE)
K-WIRE DBL .062X4 NSTRL (WIRE) ×1
K-WIRE FX200X1.8XTHRD TROC (WIRE) ×1
KIT STRATUM INSTRUMENT STD (KITS) IMPLANT
KWIRE DBL .054X4 NSTRL (WIRE) IMPLANT
KWIRE DBL .062X4 NSTRL (WIRE) IMPLANT
KWIRE FX200X1.8XTHRD TROC (WIRE) IMPLANT
LOOP VASCLR MAXI BLUE 18IN ST (MISCELLANEOUS) IMPLANT
LOOP VASCULAR MAXI 18 BLUE (MISCELLANEOUS)
LOOP VASCULAR MINI 18 RED (MISCELLANEOUS)
LOOPS VASCLR MAXI BLUE 18IN ST (MISCELLANEOUS) IMPLANT
NDL HYPO 22X1.5 SAFETY MO (MISCELLANEOUS) ×1 IMPLANT
NDL HYPO 25X1 1.5 SAFETY (NEEDLE) IMPLANT
NDL SAFETY ECLIP 18X1.5 (MISCELLANEOUS) IMPLANT
NDL SUT 6 .5 CRC .975X.05 MAYO (NEEDLE) IMPLANT
NEEDLE HYPO 22X1.5 SAFETY MO (MISCELLANEOUS) IMPLANT
NEEDLE HYPO 25X1 1.5 SAFETY (NEEDLE) IMPLANT
NEEDLE MAYO TAPER (NEEDLE)
PACK BASIN DAY SURGERY FS (CUSTOM PROCEDURE TRAY) ×1 IMPLANT
PAD CAST 4YDX4 CTTN HI CHSV (CAST SUPPLIES) ×1 IMPLANT
PADDING CAST ABS COTTON 4X4 ST (CAST SUPPLIES) IMPLANT
PADDING CAST COTTON 4X4 STRL (CAST SUPPLIES) ×1
PADDING CAST COTTON 6X4 STRL (CAST SUPPLIES) ×1 IMPLANT
PENCIL SMOKE EVACUATOR (MISCELLANEOUS) ×1 IMPLANT
PLATE CC STRM LG LT (Plate) IMPLANT
PLATE TN STRM LG LT (Plate) IMPLANT
PUTTY DBM STAGRAFT 2CC (Putty) IMPLANT
SANITIZER HAND PURELL FF 515ML (MISCELLANEOUS) ×1 IMPLANT
SCREW CANN 5.0X30MM (Screw) IMPLANT
SCREW CANN 5.0X50MM (Screw) ×2 IMPLANT
SCREW CANN FULL THD 5.0X70 (Screw) IMPLANT
SCREW CANN PT 50X5XCANN NS MTP (Screw) ×2 IMPLANT
SCREW CANN PT 5X50 NS (Screw) IMPLANT
SCREW LOCK STRATUM 3.5X18 (Screw) IMPLANT
SCREW LOCK STRATUM 3.5X20 (Screw) IMPLANT
SCREW LOCKING STRM 3.5X28 ST (Screw) IMPLANT
SCREW LP STRM NL 3.5X26 ST (Screw) IMPLANT
SCREW STRATUM NL LP ST 3.5X24 (Screw) IMPLANT
SCREW STRM LOCK 3.5X22 (Screw) IMPLANT
SCREW STRM NL LP 3.5X20 (Screw) IMPLANT
SHEET MEDIUM DRAPE 40X70 STRL (DRAPES) ×1 IMPLANT
SLEEVE SCD COMPRESS KNEE MED (STOCKING) ×1 IMPLANT
SPIKE FLUID TRANSFER (MISCELLANEOUS) IMPLANT
SPLINT PLASTER CAST FAST 5X30 (CAST SUPPLIES) ×20 IMPLANT
SPONGE SURGIFOAM ABS GEL 12-7 (HEMOSTASIS) IMPLANT
SPONGE T-LAP 18X18 ~~LOC~~+RFID (SPONGE) ×1 IMPLANT
STOCKINETTE 6  STRL (DRAPES) ×1
STOCKINETTE 6 STRL (DRAPES) ×1 IMPLANT
SUCTION FRAZIER HANDLE 10FR (MISCELLANEOUS) ×1
SUCTION TUBE FRAZIER 10FR DISP (MISCELLANEOUS) ×1 IMPLANT
SUT ETHILON 3 0 PS 1 (SUTURE) ×1 IMPLANT
SUT FIBERWIRE #2 38 T-5 BLUE (SUTURE)
SUT MNCRL AB 3-0 PS2 18 (SUTURE) ×1 IMPLANT
SUT VIC AB 1 CT1 27 (SUTURE)
SUT VIC AB 1 CT1 27XBRD ANBCTR (SUTURE) IMPLANT
SUT VIC AB 2-0 SH 27 (SUTURE) ×1
SUT VIC AB 2-0 SH 27XBRD (SUTURE) ×1 IMPLANT
SUT VICRYL 0 SH 27 (SUTURE) ×1 IMPLANT
SUTURE FIBERWR #2 38 T-5 BLUE (SUTURE) IMPLANT
SUTURE TAPE 1.3 FIBERLOP 20 ST (SUTURE) IMPLANT
SUTURETAPE 1.3 FIBERLOOP 20 ST (SUTURE)
SYR BULB EAR ULCER 3OZ GRN STR (SYRINGE) ×1 IMPLANT
SYR CONTROL 10ML LL (SYRINGE) IMPLANT
TOWEL GREEN STERILE FF (TOWEL DISPOSABLE) ×2 IMPLANT
TUBE CONNECTING 20X1/4 (TUBING) ×1 IMPLANT
UNDERPAD 30X36 HEAVY ABSORB (UNDERPADS AND DIAPERS) ×1 IMPLANT
VASCULAR TIE MAXI BLUE 18IN ST (MISCELLANEOUS)
VASCULAR TIE MINI RED 18IN STL (MISCELLANEOUS) IMPLANT
YANKAUER SUCT BULB TIP NO VENT (SUCTIONS) IMPLANT

## 2022-10-25 NOTE — Op Note (Signed)
10/25/2022  10:39 AM  PATIENT:  Barbara Calderon  57 y.o. female  PRE-OPERATIVE DIAGNOSIS:  1.  Left short achilles tendon      2.  Left charcot foot deformity with talonavicular and calcaneocuboid joint dislocations  POST-OPERATIVE DIAGNOSIS:  same  Procedure(s): 1.  Left percutaneous Achilles tendon lengthening 2.  Open treatment of left talonavicular joint dislocation 3.  Open treatment of left calcaneocuboid joint dislocation 4.  Left talonavicular joint arthrodesis 5.  Left calcaneocuboid joint arthrodesis 6.  AP, lateral and oblique radiographs of the left foot  SURGEON:  Toni Arthurs, MD  ASSISTANT: Alfredo Martinez, PA-C  ANESTHESIA:   General, regional  EBL:  minimal  TOURNIQUET:   Total Tourniquet Time Documented: Thigh (Left) - 105 minutes Total: Thigh (Left) - 105 minutes  COMPLICATIONS:  None apparent  DISPOSITION:  Extubated, awake and stable to recovery.  INDICATION FOR PROCEDURE: 57 year old female with a history of idiopathic peripheral neuropathy has developed Charcot changes of her left foot including talonavicular and calcaneocuboid joint dislocations.  She has a tight heel cord.  She has failed nonoperative treatment to date including activity modification, immobilization and oral pain medication.  She presents now for surgical treatment of these painful hindfoot dislocations.  The risks and benefits of the alternative treatment options have been discussed in detail.  The patient wishes to proceed with surgery and specifically understands risks of bleeding, infection, nerve damage, blood clots, need for additional surgery, amputation and death.   PROCEDURE IN DETAIL:  After pre operative consent was obtained, and the correct operative site was identified, the patient was brought to the operating room and placed supine on the OR table.  Anesthesia was administered.  Pre-operative antibiotics were administered.  A surgical timeout was taken.  The left lower extremity  was prepped and draped in standard sterile fashion with a tourniquet around the thigh.  The extremity was elevated, and the tourniquet was inflated to 250 mmHg.  A triple hemisection percutaneous tendo Achilles lengthening was performed with a #15 blade.  The ankle would then dorsiflex approximately 30 degrees with the knee extended.  Attention was turned to the dorsum of the hindfoot.  A longitudinal incision was made.  Dissection was carried sharply down through the subcutaneous tissues.  Care was taken to protect branches of the superficial peroneal nerve.  The extensor retinaculum was incised.  The interval between the EHL and tibialis anterior tendon were developed.  The neurovascular bundle was identified.  It was retracted and protected throughout the course of the case.  The dislocated navicular was identified.  It was dorsal to the head of the talus.  The joint capsule was incised and elevated medially and laterally.  Fibrous tissue was cleaned from the joint with a rondure.  The joint was reduced with a key elevator.  A lamina spreader was placed in the joint opening it appropriately.  The remaining articular cartilage and subchondral bone was cleaned from both sides of the joint.  The joint was irrigated copiously.  A small drill bit was used to perforate both sides of the joint leaving the resultant bone graft in place.  Attention was then turned to the lateral hindfoot.  An incision was made over the calcaneocuboid joint.  Dissection was carried sharply down through the subcutaneous tissues.  Care was taken to protect branches of the sural nerve and the peroneal tendons.  The joint was opened and was noted to have significant articular cartilage damage.  There was significant dorsal  subluxation of the cuboid relative to the calcaneus.  This was reduced.  The joint was opened with a lamina spreader.  The remaining articular cartilage and subchondral bone was removed from both sides of the joint.  The  wound was irrigated copiously.  The drill bit was used to perforate both sides of the joint.  The joint surfaces were further prepared with a small osteotome.  The talonavicular joint was reduced and provisionally pinned.  The calcaneocuboid joint was then reduced and provisionally pinned.  AP and lateral radiographs confirmed appropriate reduction of both joints.  A guidepin for the [employer] 5 mm cannulated screws was then inserted from the navicular tuberosity into the head of the talus.  Radiographs confirmed appropriate position of the guidepin.  It was overdrilled.  A 5 mm partially-threaded screw was inserted.  It was noted to compress the medial half of the joint appropriately.  A guidepin was then placed from the dorsal lateral navicular into the talus.  Radiographs again confirmed appropriate position of the pin.  It was overdrilled and a 5 mm partially-threaded screw inserted.  The screw was also noted to have excellent purchase and compressed the lateral half of the joint appropriately.  Attention was then turned to the calcaneocuboid joint.  A guidepin was inserted from the dorsal lateral cuboid into the calcaneus.  A small calcaneocuboid plate from the stratum set was selected.  The template was placed over the joint and drilled proximally.  The template was removed.  The plate was inserted proximally and impacted into the position.  The plate was pulled down to the bone with a bicortical nonlocking screw followed by a bicortical locking screw.  Distally the compression slot was drilled and the compression pin inserted.  The compression pin was then tightened compressing the joint appropriately.  2 locking screws were inserted in the cuboid distally.  The compression pin was removed.  The proximal nonlocking screw was then moved to the compression slot.  The most proximal hole in the plate was then secured with a bicortical locking screw.  A fully threaded 5 mm screw was then placed over the  guidepin crossing the calcaneocuboid joint.  Attention was then returned to the dorsal talonavicular joint.  A large talonavicular plate from the stratum set was selected.  The template was placed over the joint and pinned.  The pins were removed.  The talonavicular plate was then inserted proximally and impacted into position.  The proximal holes were drilled and filled with nonlocking screws.  Since the joint was already compressed the compression slot was used to insert a nonlocking screw followed by 2 locking screws in the distal portion of the plate.  Final AP, lateral and oblique radiographs showed appropriate position and length of all hardware and appropriate reduction of the calcaneocuboid joint and the talonavicular joint as well as arthrodesis of both joints.  The wounds were irrigated copiously.  Demineralized bone matrix and cancellous chips were packed into the dorsal gap at the calcaneocuboid joint and the lateral gap at the talonavicular joint.  The remainder of both joints were well-approximated.  Both incisions were then sprinkled with vancomycin powder.  Deep subcutaneous tissues were approximated with Vicryl.  Skin incisions were closed with nylon.  Sterile dressings were applied followed by a well-padded short leg splint.  The tourniquet was released after application of the dressings.  The patient was awakened from anesthesia and transported to the recovery room in stable condition.   FOLLOW UP PLAN:  Nonweightbearing on the left lower extremity for the next 3 months.  Xarelto for DVT prophylaxis.  Follow-up in the office in 2 weeks for suture removal and conversion to a short leg cast.   RADIOGRAPHS: AP, lateral and oblique radiographs of the left foot are obtained intraoperatively.  These show interval reduction and arthrodesis of the talonavicular and calcaneocuboid joints.  Hardware is appropriately positioned and of the appropriate lengths.  No other acute injuries are  noted.    Alfredo Martinez PA-C was present and scrubbed for the duration of the operative case. His assistance was essential in positioning the patient, prepping and draping, gaining and maintaining exposure, performing the operation, closing and dressing the wounds and applying the splint.

## 2022-10-25 NOTE — Anesthesia Procedure Notes (Signed)
Anesthesia Regional Block: Adductor canal block   Pre-Anesthetic Checklist: , timeout performed,  Correct Patient, Correct Site, Correct Laterality,  Correct Procedure, Correct Position, site marked,  Risks and benefits discussed,  Pre-op evaluation,  At surgeon's request and post-op pain management  Laterality: Left  Prep: Maximum Sterile Barrier Precautions used, chloraprep       Needles:  Injection technique: Single-shot  Needle Type: Echogenic Stimulator Needle     Needle Length: 9cm  Needle Gauge: 21     Additional Needles:   Procedures:,,,, ultrasound used (permanent image in chart),,    Narrative:  Start time: 10/25/2022 8:17 AM End time: 10/25/2022 8:20 AM Injection made incrementally with aspirations every 5 mL. Anesthesiologist: Elmer Picker, MD

## 2022-10-25 NOTE — Anesthesia Procedure Notes (Signed)
Anesthesia Regional Block: Popliteal block   Pre-Anesthetic Checklist: , timeout performed,  Correct Patient, Correct Site, Correct Laterality,  Correct Procedure, Correct Position, site marked,  Risks and benefits discussed,  Pre-op evaluation,  At surgeon's request and post-op pain management  Laterality: Left  Prep: Maximum Sterile Barrier Precautions used, chloraprep       Needles:  Injection technique: Single-shot  Needle Type: Echogenic Stimulator Needle     Needle Length: 9cm  Needle Gauge: 21     Additional Needles:   Procedures:,,,, ultrasound used (permanent image in chart),,    Narrative:  Start time: 10/25/2022 8:12 AM End time: 10/25/2022 8:17 AM Injection made incrementally with aspirations every 5 mL. Anesthesiologist: Elmer Picker, MD

## 2022-10-25 NOTE — Transfer of Care (Signed)
Immediate Anesthesia Transfer of Care Note  Patient: Barbara Calderon  Procedure(s) Performed: Achilles Tendon lengthening (Left: Foot) Talonavicular and calcanealcuboid joint Arthrodesis (Left: Foot) Open Reduction Internal Fixation talonavicular and calcanealcuboid joint dislocations (Left: Foot)  Patient Location: PACU  Anesthesia Type:General  Level of Consciousness: awake, alert , and oriented  Airway & Oxygen Therapy: Patient Spontanous Breathing and Patient connected to nasal cannula oxygen  Post-op Assessment: Report given to RN and Post -op Vital signs reviewed and stable  Post vital signs: Reviewed and stable  Last Vitals:  Vitals Value Taken Time  BP 94/49 10/25/22 1040  Temp    Pulse 55 10/25/22 1042  Resp 16 10/25/22 1042  SpO2 96 % 10/25/22 1042  Vitals shown include unvalidated device data.  Last Pain:  Vitals:   10/25/22 0655  TempSrc: Oral  PainSc: 0-No pain      Patients Stated Pain Goal: 4 (10/25/22 1610)  Complications: No notable events documented.

## 2022-10-25 NOTE — Anesthesia Preprocedure Evaluation (Addendum)
Anesthesia Evaluation  Patient identified by MRN, date of birth, ID band Patient awake    Reviewed: Allergy & Precautions, NPO status , Patient's Chart, lab work & pertinent test results  History of Anesthesia Complications (+) PONV and history of anesthetic complications  Airway Mallampati: III  TM Distance: >3 FB Neck ROM: Full  Mouth opening: Limited Mouth Opening  Dental no notable dental hx. (+) Teeth Intact, Dental Advisory Given   Pulmonary asthma , sleep apnea (no CPAP)    Pulmonary exam normal breath sounds clear to auscultation       Cardiovascular negative cardio ROS Normal cardiovascular exam Rhythm:Regular Rate:Normal     Neuro/Psych  Headaches PSYCHIATRIC DISORDERS Anxiety Depression       GI/Hepatic Neg liver ROS,GERD  ,,  Endo/Other  Hypothyroidism  Morbid obesity (BMI 41)  Renal/GU negative Renal ROS  negative genitourinary   Musculoskeletal  (+) Arthritis ,    Abdominal   Peds  Hematology negative hematology ROS (+)   Anesthesia Other Findings   Reproductive/Obstetrics                             Anesthesia Physical Anesthesia Plan  ASA: 3  Anesthesia Plan: General and Regional   Post-op Pain Management: Regional block* and Tylenol PO (pre-op)*   Induction: Intravenous  PONV Risk Score and Plan: 4 or greater and Midazolam, Dexamethasone, Ondansetron, Treatment may vary due to age or medical condition and TIVA  Airway Management Planned: Oral ETT  Additional Equipment:   Intra-op Plan:   Post-operative Plan: Extubation in OR  Informed Consent: I have reviewed the patients History and Physical, chart, labs and discussed the procedure including the risks, benefits and alternatives for the proposed anesthesia with the patient or authorized representative who has indicated his/her understanding and acceptance.     Dental advisory given  Plan Discussed with:  CRNA  Anesthesia Plan Comments:        Anesthesia Quick Evaluation

## 2022-10-25 NOTE — Discharge Instructions (Addendum)
No tylenol until 12:50 p.m.  Toni Arthurs, MD [facility]  Please read the following information regarding your care after surgery.  Medications  You only need a prescription for the narcotic pain medicine (ex. oxycodone, Percocet, Norco).  All of the other medicines listed below are available over the counter. ? Aleve 2 pills twice a day for the first 3 days after surgery. ? acetominophen (Tylenol) 650 mg every 4-6 hours as you need for minor to moderate pain ? oxycodone as prescribed for severe pain  Narcotic pain medicine (ex. oxycodone, Percocet, Vicodin) will cause constipation.  To prevent this problem, take the following medicines while you are taking any pain medicine. ? docusate sodium (Colace) 100 mg twice a day ? senna (Senokot) 2 tablets twice a day  ? To help prevent blood clots, take Xarelto as prescribed for two weeks after surgery.  You should also get up every hour while you are awake to move around.    Weight Bearing ? Do not bear any weight on the operated leg or foot.  Cast / Splint / Dressing ? Keep your splint, cast or dressing clean and dry.  Don't put anything (coat hanger, pencil, etc) down inside of it.  If it gets damp, use a hair dryer on the cool setting to dry it.  If it gets soaked, call the office to schedule an appointment for a cast change.   After your dressing, cast or splint is removed; you may shower, but do not soak or scrub the wound.  Allow the water to run over it, and then gently pat it dry.  Swelling It is normal for you to have swelling where you had surgery.  To reduce swelling and pain, keep your toes above your nose for at least 3 days after surgery.  It may be necessary to keep your foot or leg elevated for several weeks.  If it hurts, it should be elevated.  Follow Up Call my office at [phone] when you are discharged from the hospital or surgery center to schedule an appointment to be seen two weeks after surgery.  Call my office  at [phone] if you develop a fever >101.5 F, nausea, vomiting, bleeding from the surgical site or severe pain.    Regional Anesthesia Blocks  1. Numbness or the inability to move the "blocked" extremity may last from 3-48 hours after placement. The length of time depends on the medication injected and your individual response to the medication. If the numbness is not going away after 48 hours, call your surgeon.  2. The extremity that is blocked will need to be protected until the numbness is gone and the  Strength has returned. Because you cannot feel it, you will need to take extra care to avoid injury. Because it may be weak, you may have difficulty moving it or using it. You may not know what position it is in without looking at it while the block is in effect.  3. For blocks in the legs and feet, returning to weight bearing and walking needs to be done carefully. You will need to wait until the numbness is entirely gone and the strength has returned. You should be able to move your leg and foot normally before you try and bear weight or walk. You will need someone to be with you when you first try to ensure you do not fall and possibly risk injury.  4. Bruising and tenderness at the needle site are common side effects and will  resolve in a few days.  5. Persistent numbness or new problems with movement should be communicated to the surgeon or the [facility] [phone] [facility] [phone]).Information for Discharge Teaching: EXPAREL (bupivacaine liposome injectable suspension)   Your surgeon or anesthesiologist gave you EXPAREL(bupivacaine) to help control your pain after surgery.  EXPAREL is a local anesthetic that provides pain relief by numbing the tissue around the surgical site. EXPAREL is designed to release pain medication over time and can control pain for up to 72 hours. Depending on how you respond to EXPAREL, you may require less pain  medication during your recovery.  Possible side effects: Temporary loss of sensation or ability to move in the area where bupivacaine was injected. Nausea, vomiting, constipation Rarely, numbness and tingling in your mouth or lips, lightheadedness, or anxiety may occur. Call your doctor right away if you think you may be experiencing any of these sensations, or if you have other questions regarding possible side effects.  Follow all other discharge instructions given to you by your surgeon or nurse. Eat a healthy diet and drink plenty of water or other fluids.  If you return to the hospital for any reason within 96 hours following the administration of EXPAREL, it is important for health care providers to know that you have received this anesthetic. A teal colored band has been placed on your arm with the date, time and amount of EXPAREL you have received in order to alert and inform your health care providers. Please leave this armband in place for the full 96 hours following administration, and then you may remove the band. Post Anesthesia Home Care Instructions  Activity: Get plenty of rest for the remainder of the day. A responsible individual must stay with you for 24 hours following the procedure.  For the next 24 hours, DO NOT: -Drive a car -Advertising copywriter -Drink alcoholic beverages -Take any medication unless instructed by your physician -Make any legal decisions or sign important papers.  Meals: Start with liquid foods such as gelatin or soup. Progress to regular foods as tolerated. Avoid greasy, spicy, heavy foods. If nausea and/or vomiting occur, drink only clear liquids until the nausea and/or vomiting subsides. Call your physician if vomiting continues.  Special Instructions/Symptoms: Your throat may feel dry or sore from the anesthesia or the breathing tube placed in your throat during surgery. If this causes discomfort, gargle with warm salt water. The discomfort should  disappear within 24 hours.  If you had a scopolamine patch placed behind your ear for the management of post- operative nausea and/or vomiting:  1. The medication in the patch is effective for 72 hours, after which it should be removed.  Wrap patch in a tissue and discard in the trash. Wash hands thoroughly with soap and water. 2. You may remove the patch earlier than 72 hours if you experience unpleasant side effects which may include dry mouth, dizziness or visual disturbances. 3. Avoid touching the patch. Wash your hands with soap and water after contact with the patch.

## 2022-10-25 NOTE — Anesthesia Postprocedure Evaluation (Signed)
Anesthesia Post Note  Patient: Barbara Calderon  Procedure(s) Performed: Achilles Tendon lengthening (Left: Foot) Talonavicular and calcanealcuboid joint Arthrodesis (Left: Foot) Open Reduction Internal Fixation talonavicular and calcanealcuboid joint dislocations (Left: Foot)     Patient location during evaluation: PACU Anesthesia Type: Regional and General Level of consciousness: awake and alert Pain management: pain level controlled Vital Signs Assessment: post-procedure vital signs reviewed and stable Respiratory status: spontaneous breathing, nonlabored ventilation, respiratory function stable and patient connected to nasal cannula oxygen Cardiovascular status: blood pressure returned to baseline and stable Postop Assessment: no apparent nausea or vomiting Anesthetic complications: no  No notable events documented.  Last Vitals:  Vitals:   10/25/22 1045 10/25/22 1100  BP: 118/68 110/66  Pulse: 60 61  Resp: 16 16  Temp:    SpO2: 96% 94%    Last Pain:  Vitals:   10/25/22 1132  TempSrc:   PainSc: 4     LLE Motor Response: Purposeful movement (10/25/22 1132) LLE Sensation: Decreased;Numbness (10/25/22 1132)          Braden Deloach L Waller Marcussen

## 2022-10-25 NOTE — Anesthesia Procedure Notes (Signed)
Procedure Name: LMA Insertion Date/Time: 10/25/2022 8:33 AM  Performed by: Cleda Clarks, CRNAPre-anesthesia Checklist: Patient identified, Emergency Drugs available, Suction available and Patient being monitored Patient Re-evaluated:Patient Re-evaluated prior to induction Oxygen Delivery Method: Circle system utilized Preoxygenation: Pre-oxygenation with 100% oxygen Induction Type: IV induction Ventilation: Mask ventilation without difficulty LMA: LMA inserted LMA Size: 4.0 Number of attempts: 1 Placement Confirmation: positive ETCO2 Tube secured with: Tape Dental Injury: Teeth and Oropharynx as per pre-operative assessment

## 2022-10-25 NOTE — H&P (Signed)
Barbara Calderon is an 57 y.o. female.   Chief Complaint: left foot charcot HPI: [name], swelling and progressive deformity over many months.  She has a thigh heelcord and dislocated CC and TN joints due to Charcot neuroarthropathy.  She has failed non op treatment and presents for surgical treatment.  Past Medical History:  Diagnosis Date   Abnormal mammogram    Abnormal Pap smear of cervix    Anxiety    Arthritis    Asthma    Depression    GERD (gastroesophageal reflux disease)    Hashimoto's disease    Heart murmur    Echo was good per pt.   Hyperlipidemia    borderline- no meds   Hypothyroidism    Lymphedema    Lymphedema    bilateral legs   Lymphedema    legs   Neuromuscular disorder    fibro many years ago told this, neurpathy legs   Pneumonia    PONV (postoperative nausea and vomiting)    slow to wake up   Sleep apnea    mild no cpap   Thyroid disease    Hashimotos   Varicose veins     Past Surgical History:  Procedure Laterality Date   ABDOMINAL HYSTERECTOMY     partial   APPLICATION OF WOUND VAC     on left lower leg above foot   BLADDER SUSPENSION     CESAREAN SECTION     x2   CHOLECYSTECTOMY OPEN     INCISION AND DRAINAGE OF WOUND     PARTIAL HYSTERECTOMY     SKIN GRAFT     TOTAL KNEE ARTHROPLASTY Right 06/22/2022   Procedure: RIGHT TOTAL KNEE ARTHROPLASTY;  Surgeon: [name], MD;  Location: [facility];  Service: Orthopedics;  Laterality: Right;   TUBAL LIGATION      Family History  Problem Relation Age of Onset   Colon polyps Mother    Stroke Mother    Hypertension Mother    Colon polyps Father    Prostate cancer Father    Colon cancer Neg Hx    Esophageal cancer Neg Hx    Stomach cancer Neg Hx    Rectal cancer Neg Hx    Social History:  reports that she has never smoked. She has never used smokeless tobacco. She reports current alcohol use. She reports that  she does not use drugs.  Allergies:  Allergies  Allergen Reactions   Hydrocodone-Acetaminophen Hives and Itching   Latex Rash    Medications Prior to Admission  Medication Sig Dispense Refill   albuterol (VENTOLIN HFA) 108 (90 Base) MCG/ACT inhaler USE 2 INHALATIONS ORALLY   EVERY 4 HOURS AS NEEDED FORWHEEZING OR SHORTNESS OF   BREATH 54 g 0   AMBULATORY NON FORMULARY MEDICATION Knee-high, medium compression, graduated compression stockings. Apply to lower extremities. [email or web address], Zippered Compression Stockings, medium circ, long length 1 each 0   aspirin EC 81 MG tablet Take 81 mg by mouth daily. Swallow whole.     citalopram (CELEXA) 40 MG tablet TAKE 1 TABLET DAILY 90 tablet 1   diclofenac Sodium (VOLTAREN) 1 % GEL Apply 4 g topically 4 (four) times daily. 100 g 0   gabapentin (NEURONTIN) 100 MG capsule Take 1 capsule (100 mg total) by mouth 2 (two) times daily. (Patient taking differently: Take 100 mg by mouth 2 (two) times daily. PATIENT TAKES  IN AM AND  AT NIGHT)  180 capsule 1   gabapentin (NEURONTIN) 300 MG capsule Take 1 capsule (300 mg total) by mouth at bedtime. NEEDS APPOINTMENT FOR FURTHER REFILLS. 90 capsule 0   meloxicam (MOBIC) 15 MG tablet Take 1 tablet (15 mg total) by mouth daily. 90 tablet 3   oxybutynin (DITROPAN XL) 10 MG 24 hr tablet Take 1 tablet (10 mg total) by mouth at bedtime. 90 tablet 1   pantoprazole (PROTONIX) 40 MG tablet Take 1 tablet (40 mg total) by mouth daily. 90 tablet 3   SYNTHROID 125 MCG tablet Take 1 tablet (125 mcg total) by mouth daily before breakfast. 90 tablet 2   traMADol (ULTRAM) 50 MG tablet Take 1 tablet (50 mg total) by mouth every 12 (twelve) hours as needed. 60 tablet 3   furosemide (LASIX) 20 MG tablet Take 1 tablet (20 mg total) by mouth daily as needed for fluid or edema. 90 tablet 1   lidocaine (LIDODERM) 5 % Place 1 patch onto the skin daily. Remove & Discard patch within 12 hours or as directed by MD 30 patch 0    Magnesium 250 MG TABS Take 250 mg by mouth at bedtime.     potassium chloride SA (KLOR-CON M) 20 MEQ tablet Take 1 tablet (20 mEq total) by mouth daily as needed (ONLY ON THE DAYS OF TAKING LASIX). 90 tablet 1    No results found for this or any previous visit (from the past 48 hour(s)). No results found.  Review of Systems  no reent f/c/n/v/wt loss  Blood pressure 113/72, pulse (!) 55, resp. rate 20, height  (1.626 m), weight 107.2 kg, SpO2 100 %. Physical Exam  Wn wd woman in nad.  A and O.  EOMI.  Resp unlabored.  L foot with healthy skin and palpable pulses.  No lymphadneopahty.  5/5 strength in PF and DF of the ankle and toes.  Diminished sens to LT at the forefoot.  Assessment/Plan L charcot foot deformities and tight heelcord.  To the OR for ORIF of the CC and TN joints and heelcord lengthening.  The risks and benefits of the alternative treatment options have been discussed in detail.  The patient wishes to proceed with surgery and specifically understands risks of bleeding, infection, nerve damage, blood clots, need for additional surgery, amputation and death.   Toni Arthurs, MD 11/15/22, 7:22 AM

## 2022-10-26 ENCOUNTER — Encounter (HOSPITAL_BASED_OUTPATIENT_CLINIC_OR_DEPARTMENT_OTHER): Payer: Self-pay | Admitting: Orthopedic Surgery

## 2022-10-26 ENCOUNTER — Other Ambulatory Visit: Payer: Self-pay

## 2022-11-09 DIAGNOSIS — M6702 Short Achilles tendon (acquired), left ankle: Secondary | ICD-10-CM | POA: Diagnosis not present

## 2022-11-09 DIAGNOSIS — S93315A Dislocation of tarsal joint of left foot, initial encounter: Secondary | ICD-10-CM | POA: Diagnosis not present

## 2022-11-15 ENCOUNTER — Encounter: Payer: Self-pay | Admitting: Neurology

## 2022-11-26 ENCOUNTER — Telehealth: Payer: Self-pay | Admitting: Medical-Surgical

## 2022-11-26 NOTE — Telephone Encounter (Signed)
Patient's phone number is [phone] Judie Petit

## 2022-11-26 NOTE — Telephone Encounter (Signed)
Patient's son called he is asking if you would take him on as his pcp Barbara Calderon is his name

## 2022-12-12 NOTE — Progress Notes (Deleted)
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  Also consider sequela of OSA.  I do not think this represents a neurodegenerative disease. Morning headaches - reports daytime fatigue.  She has diagnosis of mild OSA - would evaluate if it has since worsened.   Gait instability - likely related to her osteoarthritis     1  Would repeat MRI of brain with and without contrast in one year 2  Refer to sleep medicine for re-evaluation of sleep apnea and whether it has progressed to now requiring a CPAP. 3  To address memory problems:             - taper off topiramate             - She will see her PCP about checking a B12 level.  Recent TSH level normal.             - Evaluate for worsening OSA 4  Follow up in one year (after repeat MRI) or sooner if needed.     Subjective:  Barbara Calderon is a 57 year old female with acquired hypothyroidism, migraines, HLD, asthma and depression who follows up for cortical dysplasia, headache and memory problems.  UPDATE: Repeat MRI not performed yet.  Scheduled for 7/12.   She saw sleep medicine.  Home sleep study in September demonstrated mild OSA.  She has ***.  To further assess memory, she was tapered off topiramate and had B12 checked which was 358 in January.  ***   HISTORY: In 2015, she began having right upper thigh pain and numbness and left arm numbness.  Around this same time, she had transient episode of gait instability.  She was seen by a neuromuscular specialist at [facility] who suspected right meralgia paresthetica, eft carpal tunnel syndrome and restless leg syndrome. NCV-EMG performed was reportedly normal.  Symptoms subsequently evolved to tingling and weakness of both arms and polyarthralgia.  Mild neck  pain.  Previous labs revealed positive ANA and elevated CRP of 11.2 but other autoimmune labs, such as ESR, RA and ENA panel, were negative.  CK was normal at 73.  Treated by rheumatology for fibromyalgia.Returned to see a different neurologist at [facility] in 2020.  NCV-EMG was again ordered but was never perfored. She then started to endorse low back pain radiating down both legs with associated weakness, numbness and swelling as well as iincreased urinary frequency but no retention or incontinence.     She has history of gait instability as well as numbness and tingling of extremities.  Previously seen by neurology at [facility] in 2015 and 2020.  NCV-EMG reportedly negative.  Autoimmune labs unremarkable except for positive ANA.  Was previously seen by rheumatology for fibromyalgia.  In 2022, she started having worsening gait instability.  She has osteoarthritis particularly severe in the right knee and left ankle.  She also has lymphedema of the left lower extremity.  She started having short term memory problems such as frequently repeating questions, misplacing items and forgetting recipes.  Her paternal grandmother had Alzheimer's late in life.  She had an MRI of the brain with and without contrast on 05/16/2021 which was personally reviewed and showed small hyperintensity in left medial parietal lobe thought to possibly be small dysplasia or hamartoma.  In December 2022 she began waking up with left sided pressure headaches, no associated symptoms.  They would resolve later in the day.  Initially daily, now a couple of days a week.  She was previously diagnosed with mild OSA not requiring CPAP.  She saw Dr. Terrace Arabia at [facility].  Patient was taking topiramate to help with weight loss and dose was increased to address headaches.  Repeat MRI of brain with and without contrast on 11/09/2021 personally reviewed was unchanged.  Most recent labs from 11/20/2021 include Hgb A1c 5.4.  She has  hypothyroidism that is now adequately treated.  TSH from 11/20/2021 was 0.80. Iron panel and vit D level from last year were normal.     Past medications:  topiramate ***   Imaging: 05/16/2021 MRI BRAIN W WO:  No acute or reversible finding. No evidence of demyelinating disease.  Subcentimeter focus of T2 and FLAIR signal within the medial left posterior parietal cortex. Favored diagnosis is small dysplasia or hamartoma. No abnormal enhancement, mass effect, edema or hemorrhage. I think this is unlikely to be clinically significant.  However, stability should be established. Initial follow-up exam suggested in 3-6 months. 11/09/2021 MRI BRAIN W WO:  1. Stable small hyperintensity left medial parietal lobe. No abnormal enhancement and no interval growth. Probable benign incidental finding. Follow-up MRI in 1 year without and with contrast recommended for stability. 2. Small hyperintensities in the right frontal subcortical white matter are better seen on today's study due to 3 tesla scanning.  These are likely chronic and may be due to small vessel ischemia.    PAST MEDICAL HISTORY: Past Medical History:  Diagnosis Date   Abnormal mammogram    Abnormal Pap smear of cervix    Anxiety    Arthritis    Asthma    Depression    GERD (gastroesophageal reflux disease)    Hashimoto's disease    Heart murmur    Echo was good per pt.   Hyperlipidemia    borderline- no meds   Hypothyroidism    Lymphedema    Lymphedema    bilateral legs   Lymphedema    legs   Neuromuscular disorder (HCC)    fibro many years ago told this, neurpathy legs   Pneumonia    PONV (postoperative nausea and vomiting)    slow to wake up   Sleep apnea    mild no cpap   Thyroid disease    Hashimotos   Varicose veins     MEDICATIONS: Current Outpatient Medications on File Prior to Visit  Medication Sig Dispense Refill   albuterol (VENTOLIN HFA) 108 (90 Base) MCG/ACT inhaler USE 2 INHALATIONS ORALLY   EVERY 4 HOURS  AS NEEDED FORWHEEZING OR SHORTNESS OF   BREATH 54 g 0   AMBULATORY NON FORMULARY MEDICATION Knee-high, medium compression, graduated compression stockings. Apply to lower extremities. [email or web address], Zippered Compression Stockings, medium circ, long length 1 each 0   citalopram (CELEXA) 40 MG tablet TAKE 1 TABLET DAILY 90 tablet 1   diclofenac Sodium (VOLTAREN) 1 % GEL Apply 4 g topically 4 (four) times daily. 100 g 0   docusate sodium (COLACE) 100 MG capsule Take 1 capsule (100 mg total) by mouth 2 (two) times daily. While taking narcotic pain medicine. 30 capsule 0   furosemide (LASIX) 20 MG tablet Take 1 tablet (20 mg total) by mouth daily as needed for fluid or edema. 90 tablet 1   gabapentin (NEURONTIN) 100 MG capsule Take 1 capsule (100  mg total) by mouth 2 (two) times daily. (Patient taking differently: Take 100 mg by mouth 2 (two) times daily. PATIENT TAKES 100MG  IN AM AND 300MG  AT NIGHT) 180 capsule 1   gabapentin (NEURONTIN) 300 MG capsule Take 1 capsule (300 mg total) by mouth at bedtime. NEEDS APPOINTMENT FOR FURTHER REFILLS. 90 capsule 0   lidocaine (LIDODERM) 5 % Place 1 patch onto the skin daily. Remove & Discard patch within 12 hours or as directed by MD 30 patch 0   Magnesium 250 MG TABS Take 250 mg by mouth at bedtime.     meloxicam (MOBIC) 15 MG tablet Take 1 tablet (15 mg total) by mouth daily. 90 tablet 3   oxybutynin (DITROPAN XL) 10 MG 24 hr tablet Take 1 tablet (10 mg total) by mouth at bedtime. 90 tablet 1   pantoprazole (PROTONIX) 40 MG tablet Take 1 tablet (40 mg total) by mouth daily. 90 tablet 3   potassium chloride SA (KLOR-CON M) 20 MEQ tablet Take 1 tablet (20 mEq total) by mouth daily as needed (ONLY ON THE DAYS OF TAKING LASIX). 90 tablet 1   rivaroxaban (XARELTO) 10 MG TABS tablet Take 1 tablet (10 mg total) by mouth daily. 14 tablet 0   senna (SENOKOT) 8.6 MG TABS tablet Take 2 tablets (17.2 mg total) by mouth 2 (two) times daily. 30 tablet 0   SYNTHROID  125 MCG tablet Take 1 tablet (125 mcg total) by mouth daily before breakfast. 90 tablet 2   traMADol (ULTRAM) 50 MG tablet Take 1 tablet (50 mg total) by mouth every 12 (twelve) hours as needed. 60 tablet 3   No current facility-administered medications on file prior to visit.    ALLERGIES: Allergies  Allergen Reactions   Hydrocodone-Acetaminophen Hives and Itching   Latex Rash    FAMILY HISTORY: Family History  Problem Relation Age of Onset   Colon polyps Mother    Stroke Mother    Hypertension Mother    Colon polyps Father    Prostate cancer Father    Colon cancer Neg Hx    Esophageal cancer Neg Hx    Stomach cancer Neg Hx    Rectal cancer Neg Hx       Objective:  *** General: No acute distress.  Patient appears ***-groomed.   Head:  Normocephalic/atraumatic Eyes:  Fundi examined but not visualized Neck: supple, no paraspinal tenderness, full range of motion Heart:  Regular rate and rhythm Lungs:  Clear to auscultation bilaterally Back: No paraspinal tenderness Neurological Exam: alert and oriented.  Speech fluent and not dysarthric, language intact.  CN II-XII intact. Bulk and tone normal, muscle strength 5/5 throughout.  Sensation to light touch intact.  Deep tendon reflexes 2+ throughout, toes downgoing.  Finger to nose testing intact.  Gait normal, Romberg negative.   Shon Millet, DO  CC: ***

## 2022-12-14 ENCOUNTER — Ambulatory Visit: Payer: Medicare Other | Admitting: Neurology

## 2022-12-21 DIAGNOSIS — S93315A Dislocation of tarsal joint of left foot, initial encounter: Secondary | ICD-10-CM | POA: Diagnosis not present

## 2022-12-21 DIAGNOSIS — Z4889 Encounter for other specified surgical aftercare: Secondary | ICD-10-CM | POA: Diagnosis not present

## 2022-12-31 ENCOUNTER — Other Ambulatory Visit: Payer: Self-pay | Admitting: Medical-Surgical

## 2023-01-02 ENCOUNTER — Ambulatory Visit: Payer: Medicare HMO | Admitting: Physician Assistant

## 2023-01-08 ENCOUNTER — Other Ambulatory Visit: Payer: Medicare HMO

## 2023-01-08 ENCOUNTER — Other Ambulatory Visit: Payer: Self-pay | Admitting: Medical-Surgical

## 2023-01-09 NOTE — Telephone Encounter (Signed)
All of the requested refills are already on file with the [facility] pharmacy.

## 2023-01-11 DIAGNOSIS — Z4889 Encounter for other specified surgical aftercare: Secondary | ICD-10-CM | POA: Diagnosis not present

## 2023-01-11 DIAGNOSIS — S93315A Dislocation of tarsal joint of left foot, initial encounter: Secondary | ICD-10-CM | POA: Diagnosis not present

## 2023-01-11 DIAGNOSIS — M6702 Short Achilles tendon (acquired), left ankle: Secondary | ICD-10-CM | POA: Diagnosis not present

## 2023-01-14 ENCOUNTER — Telehealth: Payer: Self-pay | Admitting: Physician Assistant

## 2023-01-14 NOTE — Telephone Encounter (Signed)
Called pt left vm. Provider will not be in office and need to reschedule pt wityh Magnus Ivan or Chestine Spore

## 2023-01-15 ENCOUNTER — Other Ambulatory Visit: Payer: Self-pay | Admitting: Sports Medicine

## 2023-01-15 DIAGNOSIS — M19072 Primary osteoarthritis, left ankle and foot: Secondary | ICD-10-CM

## 2023-01-17 ENCOUNTER — Ambulatory Visit: Payer: Medicare HMO | Admitting: Physician Assistant

## 2023-01-22 ENCOUNTER — Ambulatory Visit: Payer: Medicare Other | Admitting: Neurology

## 2023-01-24 ENCOUNTER — Ambulatory Visit
Admission: RE | Admit: 2023-01-24 | Discharge: 2023-01-24 | Disposition: A | Discharge status: Home / Self Care | Payer: Medicare HMO | Source: Ambulatory Visit | Attending: Neurology | Admitting: Neurology

## 2023-01-24 DIAGNOSIS — G9389 Other specified disorders of brain: Secondary | ICD-10-CM | POA: Diagnosis not present

## 2023-01-24 DIAGNOSIS — Q048 Other specified congenital malformations of brain: Secondary | ICD-10-CM

## 2023-01-24 MED ORDER — GADOPICLENOL 0.5 MMOL/ML IV SOLN
10.0000 mL | Freq: Once | INTRAVENOUS | Status: AC | PRN
  Administered 2023-01-24: 10 mL via INTRAVENOUS

## 2023-01-29 NOTE — Progress Notes (Unsigned)
NEUROLOGY FOLLOW UP OFFICE NOTE  Barbara Calderon [id number]  Assessment/Plan:   Cortical dysplasia Memory deficits - likely secondary to outside factors such as untreated OSA, low B12 and pharmacologic effect.  I do not suspect underlying neurodegenerative disease. Gait instability - likely related to her osteoarthritis, chronic pain     1  Repeat MRIs no longer needed.   For memory:  Try taking B12 500 mcg daily and get the sleep apnea treated.  Stress reduction.   For headaches: Limit use of pain relievers to no more than 2 days out of week to prevent risk of rebound or medication-overuse headache. Treat sleep apnea Stress reduction Caffeine cessation Follow up if headaches worsen Otherwise, follow up as needed     Subjective:  Barbara Calderon is a 57 year old female with acquired hypothyroidism, migraines, HLD, asthma and depression who follows up for abnormal brain MRI.  UPDATE: She had right knee replacement in December.  She had been in PT afterwards.  She later had trouble with her left foot and needed surgery to treat Charcot arthropathy in [place].  Labs from 07/25/2022 include B12 358, folate 11.8.  Hgb A1c from 08/20/2022 was 5.5.    She was tapered off of topiramate last year.  Migraines remained controlled.  She noticed memory improved a little bit but reports worsening short term memory since January.  She keeps a calendar for appointments because she will forget.  She will have to stop what she is doing to tend to something else but then forget what she was doing.  Walks into a room and forgets why she came in.  Denies disorientation on familiar routes.  Does not forget how to do everyday tasks.  She did get re-evaluation for OSA.  Not severe enough for CPAP but was ordered a mouth guard but insurance would not approve it.    Still having daily morning headaches.  Takes Tylenol or ibuprofen everyday.  Needs another eye exam.  She can tell vision has gotten worse.  Reports  more stress.  1 cup of coffee and 1 small glass of tea daily.   Repeat MRI of brain with and without contrast on 01/24/2023 personally reviewed revealed unchanged small signal abnormality in the parasagittal left parietal lobe, suspected to be cortical dysplasia.     HISTORY: In 2015, she began having right upper thigh pain and numbness and left arm numbness.  Around this same time, she had transient episode of gait instability.  She was seen by a neuromuscular specialist at [facility] who suspected right meralgia paresthetica, eft carpal tunnel syndrome and restless leg syndrome. NCV-EMG performed was reportedly normal.  Symptoms subsequently evolved to tingling and weakness of both arms and polyarthralgia.  Mild neck pain.  Previous labs revealed positive ANA and elevated CRP of 11.2 but other autoimmune labs, such as ESR, RA and ENA panel, were negative.  CK was normal at 73.  Treated by rheumatology for fibromyalgia.Returned to see a different neurologist at [facility] in 2020.  NCV-EMG was again ordered but was never perfored. She then started to endorse low back pain radiating down both legs with associated weakness, numbness and swelling as well as iincreased urinary frequency but no retention or incontinence.     She has history of gait instability as well as numbness and tingling of extremities.  Previously seen by neurology at [facility] in 2015 and 2020.  NCV-EMG reportedly negative.  Autoimmune labs unremarkable except for positive ANA.  Was previously seen by rheumatology for fibromyalgia.  In 2022, she started having worsening gait instability.  She has osteoarthritis particularly severe in the right knee and left ankle.  She also has lymphedema of the left lower extremity.  She started having short term memory problems such as frequently repeating questions, misplacing items and forgetting recipes.  Her paternal grandmother had Alzheimer's late in life.  She had an MRI of the brain with and  without contrast on 05/16/2021 which was personally reviewed and showed small hyperintensity in left medial parietal lobe thought to possibly be small dysplasia or hamartoma.  In December 2022 she began waking up with left sided pressure headaches, no associated symptoms.  They would resolve later in the day.  Initially daily, now a couple of days a week.  She was previously diagnosed with mild OSA not requiring CPAP.  She saw Dr. Terrace Arabia at [facility].  Patient was taking topiramate to help with weight loss and dose was increased to address headaches.  Repeat MRI of brain with and without contrast on 11/09/2021 personally reviewed was unchanged.  Most recent labs from 11/20/2021 include Hgb A1c 5.4.  She has hypothyroidism that is now adequately treated.  TSH from 11/20/2021 was 0.80. Iron panel and vit D level from last year were normal.     Imaging: 05/16/2021 MRI BRAIN W WO:  No acute or reversible finding. No evidence of demyelinating disease.  Subcentimeter focus of T2 and FLAIR signal within the medial left posterior parietal cortex. Favored diagnosis is small dysplasia or hamartoma. No abnormal enhancement, mass effect, edema or hemorrhage. I think this is unlikely to be clinically significant.  However, stability should be established. Initial follow-up exam suggested in 3-6 months. 11/09/2021 MRI BRAIN W WO:  1. Stable small hyperintensity left medial parietal lobe. No abnormal enhancement and no interval growth. Probable benign incidental finding. Follow-up MRI in 1 year without and with contrast recommended for stability. 2. Small hyperintensities in the right frontal subcortical white matter are better seen on today's study due to 3 tesla scanning.  These are likely chronic and may be due to small vessel ischemia.  PAST MEDICAL HISTORY: Past Medical History:  Diagnosis Date   Abnormal mammogram    Abnormal Pap smear of cervix    Anxiety    Arthritis    Asthma    Depression     GERD (gastroesophageal reflux disease)    Hashimoto's disease    Heart murmur    Echo was good per pt.   Hyperlipidemia    borderline- no meds   Hypothyroidism    Lymphedema    Lymphedema    bilateral legs   Lymphedema    legs   Neuromuscular disorder (HCC)    fibro many years ago told this, neurpathy legs   Pneumonia    PONV (postoperative nausea and vomiting)    slow to wake up   Sleep apnea    mild no cpap   Thyroid disease    Hashimotos   Varicose veins     MEDICATIONS: Current Outpatient Medications on File Prior to Visit  Medication Sig Dispense Refill   albuterol (VENTOLIN HFA) 108 (90 Base) MCG/ACT inhaler USE 2 INHALATIONS ORALLY   EVERY 4 HOURS AS NEEDED FORWHEEZING OR SHORTNESS OF   BREATH 54 g 0   AMBULATORY NON FORMULARY MEDICATION Knee-high, medium compression, graduated compression stockings. Apply to lower extremities. [email or web address], Zippered Compression Stockings, medium circ, long length 1 each 0   citalopram (CELEXA)  40 MG tablet TAKE 1 TABLET DAILY 90 tablet 1   diclofenac Sodium (VOLTAREN) 1 % GEL Apply 4 g topically 4 (four) times daily. 100 g 0   docusate sodium (COLACE) 100 MG capsule Take 1 capsule (100 mg total) by mouth 2 (two) times daily. While taking narcotic pain medicine. 30 capsule 0   furosemide (LASIX) 20 MG tablet Take 1 tablet (20 mg total) by mouth daily as needed for fluid or edema. 90 tablet 1   gabapentin (NEURONTIN) 100 MG capsule Take 1 capsule (100 mg total) by mouth 2 (two) times daily. (Patient taking differently: Take 100 mg by mouth 2 (two) times daily. PATIENT TAKES 100MG  IN AM AND 300MG  AT NIGHT) 180 capsule 1   gabapentin (NEURONTIN) 300 MG capsule Take 1 capsule (300 mg total) by mouth at bedtime. NEEDS APPOINTMENT FOR FURTHER REFILLS. 90 capsule 0   lidocaine (LIDODERM) 5 % Place 1 patch onto the skin daily. Remove & Discard patch within 12 hours or as directed by MD 30 patch 0   Magnesium 250 MG TABS Take 250 mg by  mouth at bedtime.     meloxicam (MOBIC) 15 MG tablet Take 1 tablet (15 mg total) by mouth daily. 90 tablet 3   oxybutynin (DITROPAN XL) 10 MG 24 hr tablet Take 1 tablet (10 mg total) by mouth at bedtime. 90 tablet 1   pantoprazole (PROTONIX) 40 MG tablet Take 1 tablet (40 mg total) by mouth daily. 90 tablet 3   potassium chloride SA (KLOR-CON M) 20 MEQ tablet Take 1 tablet (20 mEq total) by mouth daily as needed (ONLY ON THE DAYS OF TAKING LASIX). 90 tablet 1   rivaroxaban (XARELTO) 10 MG TABS tablet Take 1 tablet (10 mg total) by mouth daily. 14 tablet 0   senna (SENOKOT) 8.6 MG TABS tablet Take 2 tablets (17.2 mg total) by mouth 2 (two) times daily. 30 tablet 0   SYNTHROID 125 MCG tablet Take 1 tablet (125 mcg total) by mouth daily before breakfast. 90 tablet 2   traMADol (ULTRAM) 50 MG tablet Take 1 tablet (50 mg total) by mouth every 12 (twelve) hours as needed. 60 tablet 3   No current facility-administered medications on file prior to visit.    ALLERGIES: Allergies  Allergen Reactions   Hydrocodone-Acetaminophen Hives and Itching   Latex Rash    FAMILY HISTORY: Family History  Problem Relation Age of Onset   Colon polyps Mother    Stroke Mother    Hypertension Mother    Colon polyps Father    Prostate cancer Father    Colon cancer Neg Hx    Esophageal cancer Neg Hx    Stomach cancer Neg Hx    Rectal cancer Neg Hx       Objective:  Blood pressure 122/70, pulse 68, height 5\' 5"  (1.651 m), weight 236 lb (107 kg), SpO2 96%. General: No acute distress.  Patient appears well-groomed.   Head:  Normocephalic/atraumatic Eyes:  Fundi examined but not visualized Neck: supple, no paraspinal tenderness, full range of motion Heart:  Regular rate and rhythm Lungs:  Clear to auscultation bilaterally Back: No paraspinal tenderness Neurological Exam: alert and oriented.  Speech fluent and not dysarthric, language intact.      01/30/2023    9:00 AM  [employer] Mental Exam   Weekday Correct 1  Current year 1  What state are we in? 1  Amount spent 1  Amount left 2  # of Animals 3  5  objects recall 5  Number series 2  Hour markers 0  Time correct 2  Placed X in triangle correctly 1  Largest Figure 1  Name of female 2  Date back to work 2  Type of work 2  State she lived in 2  Total score 28   CN II-XII intact. Bulk and tone normal, muscle strength 5/5 throughout.  Sensation to light touch intact.  Deep tendon reflexes 2+ throughout, toes downgoing.  Finger to nose testing intact.  Gait antalgic, broad-based.   Shon Millet, DO  CC: Christen Butter, NP

## 2023-01-30 ENCOUNTER — Ambulatory Visit (INDEPENDENT_AMBULATORY_CARE_PROVIDER_SITE_OTHER): Payer: Medicare HMO | Admitting: Neurology

## 2023-01-30 ENCOUNTER — Encounter: Payer: Self-pay | Admitting: Neurology

## 2023-01-30 VITALS — BP 122/70 | HR 68 | Ht 65.0 in | Wt 236.0 lb

## 2023-01-30 DIAGNOSIS — R413 Other amnesia: Secondary | ICD-10-CM | POA: Diagnosis not present

## 2023-01-30 DIAGNOSIS — G4733 Obstructive sleep apnea (adult) (pediatric): Secondary | ICD-10-CM | POA: Diagnosis not present

## 2023-01-30 DIAGNOSIS — Q048 Other specified congenital malformations of brain: Secondary | ICD-10-CM | POA: Diagnosis not present

## 2023-01-30 DIAGNOSIS — R519 Headache, unspecified: Secondary | ICD-10-CM

## 2023-01-30 NOTE — Patient Instructions (Signed)
Repeat MRIs no longer needed.  It is an area of CORTICAL DYSPLASIA which you were born with I don't think you have Alzheimer's.  Try taking B12 500 mcg daily and get the sleep apnea treated.  Stress reduction.   For headaches: Limit use of pain relievers to no more than 2 days out of week to prevent risk of rebound or medication-overuse headache. Treat sleep apnea Stress reduction Caffeine cessation Follow up as needed

## 2023-01-31 ENCOUNTER — Encounter: Payer: Self-pay | Admitting: Physician Assistant

## 2023-01-31 ENCOUNTER — Ambulatory Visit (INDEPENDENT_AMBULATORY_CARE_PROVIDER_SITE_OTHER): Payer: Medicare HMO | Admitting: Physician Assistant

## 2023-01-31 ENCOUNTER — Other Ambulatory Visit: Payer: Self-pay

## 2023-01-31 DIAGNOSIS — Z96651 Presence of right artificial knee joint: Secondary | ICD-10-CM | POA: Diagnosis not present

## 2023-01-31 NOTE — Progress Notes (Signed)
HPI: Barbara Calderon returns today status post right total knee arthroplasty 06/22/2022.  She states overall she is doing well denies any pain in the right knee.  However she is being treated for Charcot of her left foot by Dr. Victorino Dike and is in a cast using knee walker.  She is having to rely mostly on her left leg to propel her on the knee walker.  Review of systems: See HPI  Physical exam: General: No acute distress mood and affect appropriate Right knee: Surgical incisions well-healed no signs of infection.  Right calf supple nontender.  Full extension flexion to 115 degrees.  No instability valgus varus stressing.  Dorsiflexion plantarflexion right ankle intact.  Radiographs: 2 views right knee: No acute fractures.  Knee is well located.  Status post right total knee arthroplasty with well-seated components.  No acute findings.  Impression: Status post right total knee arthroplasty  Plan: She will keep working on range of motion and strengthening right knee.  Will see her back at 1 year postop obtain 2 views of her right knee see her back sooner if there is any questions concerns.

## 2023-02-01 DIAGNOSIS — M14679 Charcot's joint, unspecified ankle and foot: Secondary | ICD-10-CM | POA: Diagnosis not present

## 2023-02-01 DIAGNOSIS — S93315A Dislocation of tarsal joint of left foot, initial encounter: Secondary | ICD-10-CM | POA: Diagnosis not present

## 2023-02-01 DIAGNOSIS — M6702 Short Achilles tendon (acquired), left ankle: Secondary | ICD-10-CM | POA: Diagnosis not present

## 2023-03-06 DIAGNOSIS — F419 Anxiety disorder, unspecified: Secondary | ICD-10-CM | POA: Diagnosis not present

## 2023-03-06 DIAGNOSIS — I499 Cardiac arrhythmia, unspecified: Secondary | ICD-10-CM | POA: Diagnosis not present

## 2023-03-06 DIAGNOSIS — J4489 Other specified chronic obstructive pulmonary disease: Secondary | ICD-10-CM | POA: Diagnosis not present

## 2023-03-06 DIAGNOSIS — E039 Hypothyroidism, unspecified: Secondary | ICD-10-CM | POA: Diagnosis not present

## 2023-03-06 DIAGNOSIS — Z008 Encounter for other general examination: Secondary | ICD-10-CM | POA: Diagnosis not present

## 2023-03-06 DIAGNOSIS — M810 Age-related osteoporosis without current pathological fracture: Secondary | ICD-10-CM | POA: Diagnosis not present

## 2023-03-06 DIAGNOSIS — R600 Localized edema: Secondary | ICD-10-CM | POA: Diagnosis not present

## 2023-03-06 DIAGNOSIS — M14672 Charcot's joint, left ankle and foot: Secondary | ICD-10-CM | POA: Diagnosis not present

## 2023-03-06 DIAGNOSIS — R32 Unspecified urinary incontinence: Secondary | ICD-10-CM | POA: Diagnosis not present

## 2023-03-06 DIAGNOSIS — R269 Unspecified abnormalities of gait and mobility: Secondary | ICD-10-CM | POA: Diagnosis not present

## 2023-03-06 DIAGNOSIS — G629 Polyneuropathy, unspecified: Secondary | ICD-10-CM | POA: Diagnosis not present

## 2023-03-06 DIAGNOSIS — K219 Gastro-esophageal reflux disease without esophagitis: Secondary | ICD-10-CM | POA: Diagnosis not present

## 2023-03-06 DIAGNOSIS — M1990 Unspecified osteoarthritis, unspecified site: Secondary | ICD-10-CM | POA: Diagnosis not present

## 2023-03-29 DIAGNOSIS — M14679 Charcot's joint, unspecified ankle and foot: Secondary | ICD-10-CM | POA: Diagnosis not present

## 2023-03-29 DIAGNOSIS — M6702 Short Achilles tendon (acquired), left ankle: Secondary | ICD-10-CM | POA: Diagnosis not present

## 2023-03-29 DIAGNOSIS — S93315A Dislocation of tarsal joint of left foot, initial encounter: Secondary | ICD-10-CM | POA: Diagnosis not present

## 2023-04-09 ENCOUNTER — Other Ambulatory Visit: Payer: Self-pay | Admitting: Medical-Surgical

## 2023-04-22 ENCOUNTER — Ambulatory Visit: Payer: Medicare Other | Admitting: Neurology

## 2023-05-16 ENCOUNTER — Other Ambulatory Visit: Payer: Self-pay | Admitting: Medical-Surgical

## 2023-05-22 DIAGNOSIS — H52223 Regular astigmatism, bilateral: Secondary | ICD-10-CM | POA: Diagnosis not present

## 2023-05-28 ENCOUNTER — Ambulatory Visit (INDEPENDENT_AMBULATORY_CARE_PROVIDER_SITE_OTHER): Payer: Medicare HMO | Admitting: Medical-Surgical

## 2023-05-28 ENCOUNTER — Encounter: Payer: Self-pay | Admitting: Medical-Surgical

## 2023-05-28 VITALS — BP 103/66 | HR 59 | Resp 20 | Ht 65.0 in | Wt 241.6 lb

## 2023-05-28 DIAGNOSIS — E782 Mixed hyperlipidemia: Secondary | ICD-10-CM

## 2023-05-28 DIAGNOSIS — F324 Major depressive disorder, single episode, in partial remission: Secondary | ICD-10-CM | POA: Diagnosis not present

## 2023-05-28 DIAGNOSIS — Z23 Encounter for immunization: Secondary | ICD-10-CM | POA: Diagnosis not present

## 2023-05-28 DIAGNOSIS — Z0000 Encounter for general adult medical examination without abnormal findings: Secondary | ICD-10-CM | POA: Diagnosis not present

## 2023-05-28 DIAGNOSIS — Z1231 Encounter for screening mammogram for malignant neoplasm of breast: Secondary | ICD-10-CM

## 2023-05-28 NOTE — Progress Notes (Signed)
Complete physical exam  Patient: Barbara Calderon   DOB: 1965-12-07   57 y.o. Female  MRN: [id number]  Subjective:    Chief Complaint  Patient presents with   Annual Exam    Barbara Calderon is a 57 y.o. female who presents today for a complete physical exam. She reports consuming a general diet. The patient does not participate in regular exercise at present. She generally feels fairly well. She reports sleeping fairly well. She does not have additional problems to discuss today.    Most recent fall risk assessment:    01/30/2023    8:30 AM  Fall Risk   Falls in the past year? 0  Number falls in past yr: 0  Injury with Fall? 0  Follow up Falls evaluation completed     Most recent depression screenings:    10/18/2022    8:55 AM 08/20/2022    2:05 PM  PHQ 2/9 Scores  PHQ - 2 Score 0 0    Vision:Within last year and Dental: No current dental problems and Receives regular dental care    Patient Care Team: Christen Butter, NP as PCP - General (Nurse Practitioner)   Outpatient Medications Prior to Visit  Medication Sig   albuterol (VENTOLIN HFA) 108 (90 Base) MCG/ACT inhaler USE 2 INHALATIONS ORALLY   EVERY 4 HOURS AS NEEDED FORWHEEZING OR SHORTNESS OF   BREATH   AMBULATORY NON FORMULARY MEDICATION Knee-high, medium compression, graduated compression stockings. Apply to lower extremities. [email or web address], Zippered Compression Stockings, medium circ, long length   citalopram (CELEXA) 40 MG tablet TAKE 1 TABLET DAILY   diclofenac Sodium (VOLTAREN) 1 % GEL Apply 4 g topically 4 (four) times daily.   furosemide (LASIX) 20 MG tablet Take 1 tablet (20 mg total) by mouth daily as needed for fluid or edema.   gabapentin (NEURONTIN) 100 MG capsule Take 1 capsule (100 mg total) by mouth 2 (two) times daily. (Patient taking differently: Take 100 mg by mouth 2 (two) times daily. PATIENT TAKES 100MG  IN AM AND 300MG  AT NIGHT)   gabapentin (NEURONTIN) 300 MG capsule Take 1 capsule (300  mg total) by mouth at bedtime. NEEDS APPOINTMENT FOR FURTHER REFILLS.   meloxicam (MOBIC) 15 MG tablet Take 1 tablet (15 mg total) by mouth daily.   oxybutynin (DITROPAN-XL) 10 MG 24 hr tablet TAKE 1 TABLET AT BEDTIME   pantoprazole (PROTONIX) 40 MG tablet Take 1 tablet (40 mg total) by mouth daily.   potassium chloride SA (KLOR-CON M) 20 MEQ tablet Take 1 tablet (20 mEq total) by mouth daily as needed (ONLY ON THE DAYS OF TAKING LASIX).   SYNTHROID 125 MCG tablet Take 1 tablet (125 mcg total) by mouth daily before breakfast.   traMADol (ULTRAM) 50 MG tablet Take 1 tablet (50 mg total) by mouth every 12 (twelve) hours as needed.   No facility-administered medications prior to visit.    Review of Systems  Constitutional:  Positive for malaise/fatigue. Negative for chills, fever and weight loss.  HENT:  Negative for congestion, ear pain, hearing loss, sinus pain and sore throat.   Eyes:  Negative for blurred vision, photophobia and pain.  Respiratory:  Negative for cough, shortness of breath and wheezing.   Cardiovascular:  Positive for leg swelling. Negative for chest pain and palpitations.  Gastrointestinal:  Negative for abdominal pain, constipation, diarrhea, heartburn, nausea and vomiting.  Genitourinary:  Negative for dysuria, frequency and urgency.       Nocturia x 2   Musculoskeletal:  Positive for joint pain. Negative for falls and neck pain.  Skin:  Negative for itching and rash.  Neurological:  Positive for headaches. Negative for dizziness and weakness.  Endo/Heme/Allergies:  Negative for polydipsia. Does not bruise/bleed easily.  Psychiatric/Behavioral:  Negative for depression, substance abuse and suicidal ideas. The patient has insomnia. The patient is not nervous/anxious.      Objective:     BP 103/66 (BP Location: Left Arm, Cuff Size: Normal)   Pulse (!) 59   Resp 20   Ht 5\' 5"  (1.651 m)   Wt 241 lb 9.6 oz (109.6 kg)   SpO2 96%   BMI 40.20 kg/m    Physical  Exam Constitutional:      General: She is not in acute distress.    Appearance: Normal appearance. She is obese. She is not ill-appearing.  HENT:     Head: Normocephalic and atraumatic.     Right Ear: Tympanic membrane, ear canal and external ear normal. There is no impacted cerumen.     Left Ear: Tympanic membrane, ear canal and external ear normal. There is no impacted cerumen.     Nose: Nose normal. No congestion or rhinorrhea.     Mouth/Throat:     Mouth: Mucous membranes are moist.     Pharynx: No oropharyngeal exudate or posterior oropharyngeal erythema.  Eyes:     General: No scleral icterus.       Right eye: No discharge.        Left eye: No discharge.     Extraocular Movements: Extraocular movements intact.     Conjunctiva/sclera: Conjunctivae normal.     Pupils: Pupils are equal, round, and reactive to light.  Neck:     Thyroid: No thyromegaly.     Vascular: No carotid bruit or JVD.     Trachea: Trachea normal.  Cardiovascular:     Rate and Rhythm: Normal rate and regular rhythm.     Pulses: Normal pulses.     Heart sounds: Normal heart sounds. No murmur heard.    No friction rub. No gallop.  Pulmonary:     Effort: Pulmonary effort is normal. No respiratory distress.     Breath sounds: Normal breath sounds. No wheezing.  Abdominal:     General: Bowel sounds are normal. There is no distension.     Palpations: Abdomen is soft.     Tenderness: There is no abdominal tenderness. There is no guarding.  Musculoskeletal:        General: Normal range of motion.     Cervical back: Normal range of motion and neck supple.     Comments: Wearing prescription boot on the left foot.  Lymphadenopathy:     Cervical: No cervical adenopathy.  Skin:    General: Skin is warm and dry.  Neurological:     Mental Status: She is alert and oriented to person, place, and time.     Cranial Nerves: No cranial nerve deficit.  Psychiatric:        Mood and Affect: Mood normal.         Behavior: Behavior normal.        Thought Content: Thought content normal.        Judgment: Judgment normal.   No results found for any visits on 05/28/23.     Assessment & Plan:    Routine Health Maintenance and Physical Exam  Immunization History  Administered Date(s) Administered   Influenza, Seasonal, Injecte, Preservative Fre 05/28/2023   Influenza,inj,Quad PF,6+ Mos 03/10/2021,  04/11/2022   Influenza,inj,Quad PF,6-35 Mos 07/31/2018   Influenza-Unspecified 07/09/2009, 04/23/2013, 05/04/2014, 04/27/2015   Janssen (J&J) SARS-COV-2 Vaccination 02/27/2020   Tdap 07/10/2007    Health Maintenance  Topic Date Due   COVID-19 Vaccine (2 - 2023-24 season) 06/13/2023 (Originally 03/10/2023)   HIV Screening  08/11/2023 (Originally 01/06/1980)   Zoster Vaccines- Shingrix (1 of 2) 08/28/2023 (Originally 01/06/2015)   DTaP/Tdap/Td (2 - Td or Tdap) 05/27/2024 (Originally 07/09/2017)   Medicare Annual Wellness (AWV)  08/11/2023   MAMMOGRAM  12/28/2023   Cervical Cancer Screening (HPV/Pap Cotest)  09/08/2026   Colonoscopy  09/06/2031   INFLUENZA VACCINE  Completed   Hepatitis C Screening  Completed   HPV VACCINES  Aged Out    Discussed health benefits of physical activity, and encouraged her to engage in regular exercise appropriate for her age and condition.  1. Annual physical exam Checking labs as below.  Up-to-date on preventative care.  Wellness information provided with AVS. - CBC with Differential/Platelet - CMP14+EGFR  2. Major depressive disorder with single episode, in partial remission (HCC) Stable with situational stressors.  Continue Celexa as prescribed.  3. Mixed hyperlipidemia Checking lipids today. - Lipid panel  4. Encounter for screening mammogram for malignant neoplasm of breast Mammogram ordered. - MM DIGITAL SCREENING BILATERAL; Future  5. Encounter for immunization Flu shot given in office today.  Plan for shingles and Tdap at her local pharmacy. - Flu  vaccine trivalent PF, 6mos and older(Flulaval,Afluria,Fluarix,Fluzone)  Return in about 6 months (around 11/25/2023) for chronic disease follow up.   Christen Butter, NP

## 2023-05-28 NOTE — Patient Instructions (Signed)
Preventive Care 40-58 Years Old, Female Preventive care refers to lifestyle choices and visits with your health care provider that can promote health and wellness. Preventive care visits are also called wellness exams. What can I expect for my preventive care visit? Counseling Your health care provider may ask you questions about your: Medical history, including: Past medical problems. Family medical history. Pregnancy history. Current health, including: Menstrual cycle. Method of birth control. Emotional well-being. Home life and relationship well-being. Sexual activity and sexual health. Lifestyle, including: Alcohol, nicotine or tobacco, and drug use. Access to firearms. Diet, exercise, and sleep habits. Work and work environment. Sunscreen use. Safety issues such as seatbelt and bike helmet use. Physical exam Your health care provider will check your: Height and weight. These may be used to calculate your BMI (body mass index). BMI is a measurement that tells if you are at a healthy weight. Waist circumference. This measures the distance around your waistline. This measurement also tells if you are at a healthy weight and may help predict your risk of certain diseases, such as type 2 diabetes and high blood pressure. Heart rate and blood pressure. Body temperature. Skin for abnormal spots. What immunizations do I need?  Vaccines are usually given at various ages, according to a schedule. Your health care provider will recommend vaccines for you based on your age, medical history, and lifestyle or other factors, such as travel or where you work. What tests do I need? Screening Your health care provider may recommend screening tests for certain conditions. This may include: Lipid and cholesterol levels. Diabetes screening. This is done by checking your blood sugar (glucose) after you have not eaten for a while (fasting). Pelvic exam and Pap test. Hepatitis B test. Hepatitis C  test. HIV (human immunodeficiency virus) test. STI (sexually transmitted infection) testing, if you are at risk. Lung cancer screening. Colorectal cancer screening. Mammogram. Talk with your health care provider about when you should start having regular mammograms. This may depend on whether you have a family history of breast cancer. BRCA-related cancer screening. This may be done if you have a family history of breast, ovarian, tubal, or peritoneal cancers. Bone density scan. This is done to screen for osteoporosis. Talk with your health care provider about your test results, treatment options, and if necessary, the need for more tests. Follow these instructions at home: Eating and drinking  Eat a diet that includes fresh fruits and vegetables, whole grains, lean protein, and low-fat dairy products. Take vitamin and mineral supplements as recommended by your health care provider. Do not drink alcohol if: Your health care provider tells you not to drink. You are pregnant, may be pregnant, or are planning to become pregnant. If you drink alcohol: Limit how much you have to 0-1 drink a day. Know how much alcohol is in your drink. In the [place] one drink equals one 12 oz bottle of beer (355 mL), one 5 oz glass of wine (148 mL), or one 1 oz glass of hard liquor (44 mL). Lifestyle Brush your teeth every morning and night with fluoride toothpaste. Floss one time each day. Exercise for at least 30 minutes 5 or more days each week. Do not use any products that contain nicotine or tobacco. These products include cigarettes, chewing tobacco, and vaping devices, such as e-cigarettes. If you need help quitting, ask your health care provider. Do not use drugs. If you are sexually active, practice safe sex. Use a condom or other form of protection to   prevent STIs. If you do not wish to become pregnant, use a form of birth control. If you plan to become pregnant, see your health care provider for a  prepregnancy visit. Take aspirin only as told by your health care provider. Make sure that you understand how much to take and what form to take. Work with your health care provider to find out whether it is safe and beneficial for you to take aspirin daily. Find healthy ways to manage stress, such as: Meditation, yoga, or listening to music. Journaling. Talking to a trusted person. Spending time with friends and family. Minimize exposure to UV radiation to reduce your risk of skin cancer. Safety Always wear your seat belt while driving or riding in a vehicle. Do not drive: If you have been drinking alcohol. Do not ride with someone who has been drinking. When you are tired or distracted. While texting. If you have been using any mind-altering substances or drugs. Wear a helmet and other protective equipment during sports activities. If you have firearms in your house, make sure you follow all gun safety procedures. Seek help if you have been physically or sexually abused. What's next? Visit your health care provider once a year for an annual wellness visit. Ask your health care provider how often you should have your eyes and teeth checked. Stay up to date on all vaccines. This information is not intended to replace advice given to you by your health care provider. Make sure you discuss any questions you have with your health care provider. Document Revised: 12/21/2020 Document Reviewed: 12/21/2020 Elsevier Patient Education  2024 [employer]  

## 2023-05-29 LAB — CBC WITH DIFFERENTIAL/PLATELET
Basophils Absolute: 0 10*3/uL (ref 0.0–0.2)
Basos: 1 %
EOS (ABSOLUTE): 0.2 10*3/uL (ref 0.0–0.4)
Eos: 3 %
Hematocrit: 43.9 % (ref 34.0–46.6)
Hemoglobin: 14.4 g/dL (ref 11.1–15.9)
Immature Grans (Abs): 0 10*3/uL (ref 0.0–0.1)
Immature Granulocytes: 0 %
Lymphocytes Absolute: 1.4 10*3/uL (ref 0.7–3.1)
Lymphs: 25 %
MCH: 30.5 pg (ref 26.6–33.0)
MCHC: 32.8 g/dL (ref 31.5–35.7)
MCV: 93 fL (ref 79–97)
Monocytes Absolute: 0.4 10*3/uL (ref 0.1–0.9)
Monocytes: 8 %
Neutrophils Absolute: 3.5 10*3/uL (ref 1.4–7.0)
Neutrophils: 63 %
Platelets: 272 10*3/uL (ref 150–450)
RBC: 4.72 x10E6/uL (ref 3.77–5.28)
RDW: 13.1 % (ref 11.7–15.4)
WBC: 5.6 10*3/uL (ref 3.4–10.8)

## 2023-05-29 LAB — CMP14+EGFR
ALT: 11 [IU]/L (ref 0–32)
AST: 18 [IU]/L (ref 0–40)
Albumin: 4.5 g/dL (ref 3.8–4.9)
Alkaline Phosphatase: 97 [IU]/L (ref 44–121)
BUN/Creatinine Ratio: 28 — ABNORMAL HIGH (ref 9–23)
BUN: 26 mg/dL — ABNORMAL HIGH (ref 6–24)
Bilirubin Total: 0.4 mg/dL (ref 0.0–1.2)
CO2: 24 mmol/L (ref 20–29)
Calcium: 9.1 mg/dL (ref 8.7–10.2)
Chloride: 104 mmol/L (ref 96–106)
Creatinine, Ser: 0.94 mg/dL (ref 0.57–1.00)
Globulin, Total: 2.4 g/dL (ref 1.5–4.5)
Glucose: 87 mg/dL (ref 70–99)
Potassium: 4.3 mmol/L (ref 3.5–5.2)
Sodium: 143 mmol/L (ref 134–144)
Total Protein: 6.9 g/dL (ref 6.0–8.5)
eGFR: 70 mL/min/{1.73_m2} (ref >59)

## 2023-05-29 LAB — LIPID PANEL
Chol/HDL Ratio: 3.1 ratio (ref 0.0–4.4)
Cholesterol, Total: 229 mg/dL — ABNORMAL HIGH (ref 100–199)
HDL: 74 mg/dL (ref >39)
LDL Chol Calc (NIH): 141 mg/dL — ABNORMAL HIGH (ref 0–99)
Triglycerides: 79 mg/dL (ref 0–149)
VLDL Cholesterol Cal: 14 mg/dL (ref 5–40)

## 2023-06-24 ENCOUNTER — Ambulatory Visit (INDEPENDENT_AMBULATORY_CARE_PROVIDER_SITE_OTHER): Payer: Medicare HMO | Admitting: Orthopaedic Surgery

## 2023-06-24 ENCOUNTER — Encounter: Payer: Self-pay | Admitting: Orthopaedic Surgery

## 2023-06-24 ENCOUNTER — Other Ambulatory Visit (INDEPENDENT_AMBULATORY_CARE_PROVIDER_SITE_OTHER): Payer: Self-pay

## 2023-06-24 ENCOUNTER — Telehealth: Payer: Self-pay | Admitting: *Deleted

## 2023-06-24 DIAGNOSIS — Z96651 Presence of right artificial knee joint: Secondary | ICD-10-CM | POA: Diagnosis not present

## 2023-06-24 NOTE — Progress Notes (Signed)
The patient is a 57 year old active female who is now a year out from a right total knee arthroplasty.  She reports that she has been range of motion and strength of that knee and she is doing well.  Her right knee before surgery had severe bone-on-bone wear with significant varus malalignment.  She is very satisfied thus far.  On exam her right operative knee moves smoothly and fluidly.  It feels ligamentously stable and has full range of motion.  2 views of the right knee show well-seated total knee arthroplasty with no complicating features.  At this point follow-up for right knee can be as needed.  If she does develop any issues with that at all she knows to let us know.  She does have some mild to moderate arthritis in her left knee but that has not changed and does not have significant pain.  She is also dealing with unfortunately Charcot arthropathy of her left foot that is being followed by foot and ankle specialist.

## 2023-06-24 NOTE — Telephone Encounter (Signed)
1 year follow up call completed.

## 2023-06-26 DIAGNOSIS — H2513 Age-related nuclear cataract, bilateral: Secondary | ICD-10-CM | POA: Diagnosis not present

## 2023-06-26 DIAGNOSIS — H353121 Nonexudative age-related macular degeneration, left eye, early dry stage: Secondary | ICD-10-CM | POA: Diagnosis not present

## 2023-07-22 ENCOUNTER — Other Ambulatory Visit: Payer: Self-pay

## 2023-07-22 MED ORDER — CITALOPRAM HYDROBROMIDE 40 MG PO TABS: 40.0000 mg | ORAL_TABLET | Freq: Every day | ORAL | 0 refills | Status: DC

## 2023-07-22 MED ORDER — GABAPENTIN 300 MG PO CAPS: 300.0000 mg | ORAL_CAPSULE | Freq: Every day | ORAL | 0 refills | Status: DC

## 2023-07-23 ENCOUNTER — Other Ambulatory Visit: Payer: Self-pay

## 2023-07-23 DIAGNOSIS — M19072 Primary osteoarthritis, left ankle and foot: Secondary | ICD-10-CM

## 2023-07-23 MED ORDER — ALBUTEROL SULFATE HFA 108 (90 BASE) MCG/ACT IN AERS: 2.0000 | INHALATION_SPRAY | RESPIRATORY_TRACT | 0 refills | Status: DC | PRN

## 2023-07-23 MED ORDER — SYNTHROID 125 MCG PO TABS: 125.0000 ug | ORAL_TABLET | Freq: Every day | ORAL | 2 refills | Status: DC

## 2023-07-23 MED ORDER — OXYBUTYNIN CHLORIDE ER 10 MG PO TB24: 10.0000 mg | ORAL_TABLET | Freq: Every day | ORAL | 0 refills | Status: DC

## 2023-07-23 MED ORDER — MELOXICAM 15 MG PO TABS
15.0000 mg | ORAL_TABLET | Freq: Every day | ORAL | 3 refills | Status: DC
Start: 2023-07-23 — End: 2024-04-02

## 2023-07-24 ENCOUNTER — Ambulatory Visit: Payer: Medicare HMO

## 2023-07-24 DIAGNOSIS — M14672 Charcot's joint, left ankle and foot: Secondary | ICD-10-CM | POA: Diagnosis not present

## 2023-08-01 ENCOUNTER — Other Ambulatory Visit: Payer: Self-pay | Admitting: Medical-Surgical

## 2023-08-01 MED ORDER — GABAPENTIN 100 MG PO CAPS: 100.0000 mg | ORAL_CAPSULE | Freq: Two times a day (BID) | ORAL | 1 refills | Status: DC

## 2023-08-01 NOTE — Telephone Encounter (Signed)
Copied from CRM [id number]. Topic: Clinical - Medication Refill >> Aug 01, 2023  3:10 PM Shelah Lewandowsky wrote: Most Recent Primary Care Visit:  Provider: Christen Butter  Department: [facility]  Visit Type: PHYSICAL  Date: 05/28/2023  Medication: gabapentin (NEURONTIN) 100 MG capsule  Has the patient contacted their pharmacy? Yes (Agent: If no, request that the patient contact the pharmacy for the refill. If patient does not wish to contact the pharmacy document the reason why and proceed with request.) (Agent: If yes, when and what did the pharmacy advise?)  Is this the correct pharmacy for this prescription? Yes If no, delete pharmacy and type the correct one.  This is the patient's preferred pharmacy:   [facility] - [place], [place] - [address] [facility] AT Portal to Registered Caremark Sites [address] [place] [place] [address] Phone: [phone] Fax: [phone]   Has the prescription been filled recently? Yes  Is the patient out of the medication? No  Has the patient been seen for an appointment in the last year OR does the patient have an upcoming appointment? Yes  Can we respond through MyChart? No  Agent: Please be advised that Rx refills may take up to 3 business days. We ask that you follow-up with your pharmacy.

## 2023-08-01 NOTE — Telephone Encounter (Signed)
Last Fill: 08/20/22  Last OV: 05/28/23 Next OV: 08/13/23 AWV  Routing to provider for review/authorization.

## 2023-08-02 ENCOUNTER — Other Ambulatory Visit: Payer: Self-pay

## 2023-08-02 ENCOUNTER — Other Ambulatory Visit: Payer: Self-pay | Admitting: Medical-Surgical

## 2023-08-02 MED ORDER — PANTOPRAZOLE SODIUM 40 MG PO TBEC: 40.0000 mg | DELAYED_RELEASE_TABLET | Freq: Every day | ORAL | 1 refills | Status: DC

## 2023-08-02 NOTE — Telephone Encounter (Signed)
Last Fill: Albuterol: 07/23/23     Oxybutynin: 07/23/23  Last OV: 05/28/23 Next OV: 10/11/23  Routing to provider for review/authorization.

## 2023-08-02 NOTE — Telephone Encounter (Signed)
Copied from CRM [id number]. Topic: Clinical - Medication Refill >> Aug 02, 2023  3:15 PM Elle L wrote: Most Recent Primary Care Visit:  Provider: Christen Butter  Department: [facility]  Visit Type: PHYSICAL  Date: 05/28/2023  Medication: oxybutynin (DITROPAN-XL) 10 MG 24 hr tablet AND albuterol (VENTOLIN HFA) 108 (90 Base) MCG/ACT inhaler   Has the patient contacted their pharmacy? Yes. The pharmacy called on behalf of the patient.   Is this the correct pharmacy for this prescription? Yes  This is the patient's preferred pharmacy:   [facility] - [place], [place] - [address] [facility] AT Portal to Registered Caremark Sites [address] [place] [address] Phone: [phone] Fax: [phone]   Has the prescription been filled recently? Yes  Is the patient out of the medication? Yes  Has the patient been seen for an appointment in the last year OR does the patient have an upcoming appointment? Yes  Can we respond through MyChart? No  Agent: Please be advised that Rx refills may take up to 3 business days. We ask that you follow-up with your pharmacy.

## 2023-08-09 ENCOUNTER — Other Ambulatory Visit: Payer: Self-pay | Admitting: Medical-Surgical

## 2023-08-09 DIAGNOSIS — Z20828 Contact with and (suspected) exposure to other viral communicable diseases: Secondary | ICD-10-CM

## 2023-08-09 MED ORDER — OSELTAMIVIR PHOSPHATE 75 MG PO CAPS: 75.0000 mg | ORAL_CAPSULE | Freq: Two times a day (BID) | ORAL | 0 refills | Status: DC

## 2023-08-13 ENCOUNTER — Ambulatory Visit (INDEPENDENT_AMBULATORY_CARE_PROVIDER_SITE_OTHER): Payer: No Typology Code available for payment source

## 2023-08-13 VITALS — Ht 65.0 in | Wt 238.0 lb

## 2023-08-13 DIAGNOSIS — Z0000 Encounter for general adult medical examination without abnormal findings: Secondary | ICD-10-CM | POA: Diagnosis not present

## 2023-08-13 NOTE — Patient Instructions (Signed)
  Barbara Calderon , Thank you for taking time to come for your Medicare Wellness Visit. I appreciate your ongoing commitment to your health goals. Please review the following plan we discussed and let me know if I can assist you in the future.   These are the goals we discussed:  Goals       Patient Stated (pt-stated)      Would like to loose 50 lbs.      Patient Stated (pt-stated)      Patient stated that she would like to loose 50 lbs.      Weight (lb) < 200 lb (90.7 kg)      She would like to lose weight.         This is a list of the screening recommended for you and due dates:  Health Maintenance  Topic Date Due   Pneumococcal Vaccination (1 of 2 - PCV) Never done   HIV Screening  Never done   COVID-19 Vaccine (2 - 2024-25 season) 03/10/2023   Zoster (Shingles) Vaccine (1 of 2) 08/28/2023*   DTaP/Tdap/Td vaccine (2 - Td or Tdap) 05/27/2024*   Mammogram  12/28/2023   Medicare Annual Wellness Visit  08/12/2024   Pap with HPV screening  09/08/2026   Colon Cancer Screening  09/06/2031   Flu Shot  Completed   Hepatitis C Screening  Completed   HPV Vaccine  Aged Out  *Topic was postponed. The date shown is not the original due date.

## 2023-08-13 NOTE — Progress Notes (Signed)
 Subjective:   Barbara Calderon is a 58 y.o. female who presents for Medicare Annual (Subsequent) preventive examination.  Visit Complete: Virtual I connected with  Barbara Calderon on 08/13/23 by a audio enabled telemedicine application and verified that I am speaking with the correct person using two identifiers.  Patient Location: Home  Provider Location: Office/Clinic  I discussed the limitations of evaluation and management by telemedicine. The patient expressed understanding and agreed to proceed.  Vital Signs: Because this visit was a virtual/telehealth visit, some criteria may be missing or patient reported. Any vitals not documented were not able to be obtained and vitals that have been documented are patient reported.  Patient Medicare AWV questionnaire was completed by the patient on 08/11/2023; I have confirmed that all information answered by patient is correct and no changes since this date.  Cardiac Risk Factors include: obesity (BMI >30kg/m2)     Objective:    Today's Vitals   08/13/23 0802 08/13/23 0803  Weight: 238 lb (108 kg)   Height: 5' 5 (1.651 m)   PainSc:  3    Body mass index is 39.61 kg/m.     08/13/2023    8:18 AM 01/30/2023    8:30 AM 10/25/2022    6:53 AM 10/17/2022   12:22 PM 08/10/2022    8:10 AM 06/22/2022    5:56 PM 06/19/2022    9:23 AM  Advanced Directives  Does Patient Have a Medical Advance Directive? No No No No No No No  Would patient like information on creating a medical advance directive? Yes (ED - Information included in AVS)  No - Patient declined No - Patient declined No - Patient declined No - Patient declined No - Patient declined    Current Medications (verified) Outpatient Encounter Medications as of 08/13/2023  Medication Sig   albuterol  (VENTOLIN  HFA) 108 (90 Base) MCG/ACT inhaler Inhale 2 puffs into the lungs every 4 (four) hours as needed for wheezing or shortness of breath. USE 2 INHALATIONS ORALLY   EVERY 4 HOURS AS NEEDED  FORWHEEZING OR SHORTNESS OF   BREATH   AMBULATORY NON FORMULARY MEDICATION Knee-high, medium compression, graduated compression stockings. Apply to lower extremities. [email or web address], Zippered Compression Stockings, medium circ, long length   citalopram  (CELEXA ) 40 MG tablet Take 1 tablet (40 mg total) by mouth daily.   furosemide  (LASIX ) 20 MG tablet Take 1 tablet (20 mg total) by mouth daily as needed for fluid or edema.   gabapentin  (NEURONTIN ) 100 MG capsule Take 1 capsule (100 mg total) by mouth 2 (two) times daily.   gabapentin  (NEURONTIN ) 300 MG capsule Take 1 capsule (300 mg total) by mouth at bedtime.   meloxicam  (MOBIC ) 15 MG tablet Take 1 tablet (15 mg total) by mouth daily.   oxybutynin  (DITROPAN -XL) 10 MG 24 hr tablet Take 1 tablet (10 mg total) by mouth at bedtime.   pantoprazole  (PROTONIX ) 40 MG tablet Take 1 tablet (40 mg total) by mouth daily.   potassium chloride  SA (KLOR-CON  M) 20 MEQ tablet Take 1 tablet (20 mEq total) by mouth daily as needed (ONLY ON THE DAYS OF TAKING LASIX ).   SYNTHROID  125 MCG tablet Take 1 tablet (125 mcg total) by mouth daily before breakfast.   traMADol  (ULTRAM ) 50 MG tablet Take 1 tablet (50 mg total) by mouth every 12 (twelve) hours as needed.   diclofenac  Sodium (VOLTAREN ) 1 % GEL Apply 4 g topically 4 (four) times daily. (Patient not taking: Reported on 08/13/2023)   [DISCONTINUED]  oseltamivir  (TAMIFLU ) 75 MG capsule Take 1 capsule (75 mg total) by mouth 2 (two) times daily.   No facility-administered encounter medications on file as of 08/13/2023.    Allergies (verified) Hydrocodone-acetaminophen  and Latex   History: Past Medical History:  Diagnosis Date   Abnormal mammogram    Abnormal Pap smear of cervix    Anxiety    Arthritis    Asthma    Depression    GERD (gastroesophageal reflux disease)    Hashimoto's disease    Heart murmur    Echo was good per pt.   Hyperlipidemia    borderline- no meds   Hypothyroidism    Lymphedema     Lymphedema    bilateral legs   Lymphedema    legs   Neuromuscular disorder (HCC)    fibro many years ago told this, neurpathy legs   Pneumonia    PONV (postoperative nausea and vomiting)    slow to wake up   Sleep apnea    mild no cpap   Thyroid  disease    Hashimotos   Varicose veins    Past Surgical History:  Procedure Laterality Date   ABDOMINAL HYSTERECTOMY     partial   ACHILLES TENDON SURGERY Left 10/25/2022   Procedure: Achilles Tendon lengthening;  Surgeon: Kit Rush, MD;  Location: [facility];  Service: Orthopedics;  Laterality: Left;   APPLICATION OF WOUND VAC     on left lower leg above foot   BLADDER SUSPENSION     CESAREAN SECTION     x2   CHOLECYSTECTOMY OPEN     FOOT ARTHRODESIS Left 10/25/2022   Procedure: Talonavicular and calcanealcuboid joint Arthrodesis;  Surgeon: Kit Rush, MD;  Location: [facility];  Service: Orthopedics;  Laterality: Left;   INCISION AND DRAINAGE OF WOUND     OPEN REDUCTION INTERNAL FIXATION (ORIF) FOOT LISFRANC FRACTURE Left 10/25/2022   Procedure: Open Reduction Internal Fixation talonavicular and calcanealcuboid joint dislocations;  Surgeon: Kit Rush, MD;  Location:  SUR[facility]ERY CENTER;  Service: Orthopedics;  Laterality: Left;   PARTIAL HYSTERECTOMY     SKIN GRAFT     TOTAL KNEE ARTHROPLASTY Right 06/22/2022   Procedure: RIGHT TOTAL KNEE ARTHROPLASTY;  Surgeon: Vernetta Lonni GRADE, MD;  Location: [facility];  Service: Orthopedics;  Laterality: Right;   TUBAL LIGATION     Family History  Problem Relation Age of Onset   Colon polyps Mother    Stroke Mother    Hypertension Mother    Colon polyps Father    Prostate cancer Father    Colon cancer Neg Hx    Esophageal cancer Neg Hx    Stomach cancer Neg Hx    Rectal cancer Neg Hx    Social History   Socioeconomic History   Marital status: Married    Spouse name: Barbara Calderon   Number of children: 4   Years of education: 12   Highest  education level: 12th grade  Occupational History    Comment: Legally disabled.  Tobacco Use   Smoking status: Never   Smokeless tobacco: Never  Vaping Use   Vaping status: Never Used  Substance and Sexual Activity   Alcohol use: Yes    Comment: rare 3-4 a year   Drug use: No   Sexual activity: Yes    Partners: Male    Birth control/protection: Surgical    Comment: HYST  Other Topics Concern   Not on file  Social History Narrative   Lives with her  family. She lives with her husband and 3 children and one foster child . She enjoys crafts.   Social Drivers of Corporate Investment Banker Strain: Low Risk  (08/13/2023)   Overall Financial Resource Strain (CARDIA)    Difficulty of Paying Living Expenses: Not hard at all  Food Insecurity: No Food Insecurity (08/13/2023)   Hunger Vital Sign    Worried About Running Out of Food in the Last Year: Never true    Ran Out of Food in the Last Year: Never true  Transportation Needs: No Transportation Needs (08/13/2023)   PRAPARE - Administrator, Civil Service (Medical): No    Lack of Transportation (Non-Medical): No  Physical Activity: Insufficiently Active (08/13/2023)   Exercise Vital Sign    Days of Exercise per Week: 2 days    Minutes of Exercise per Session: 60 min  Stress: No Stress Concern Present (08/13/2023)   [employer] of Occupational Health - Occupational Stress Questionnaire    Feeling of Stress : Not at all  Social Connections: Moderately Isolated (08/13/2023)   Social Connection and Isolation Panel [NHANES]    Frequency of Communication with Friends and Family: More than three times a week    Frequency of Social Gatherings with Friends and Family: More than three times a week    Attends Religious Services: Never    Database Administrator or Organizations: No    Attends Engineer, Structural: Never    Marital Status: Married    Tobacco Counseling Counseling given: Not Answered   Clinical  Intake:  Pre-visit preparation completed: Yes  Pain : 0-10 Pain Score: 3  Pain Type: Chronic pain Pain Location: Foot Pain Orientation: Left Pain Descriptors / Indicators: Aching Pain Onset: More than a month ago Pain Frequency: Intermittent Pain Relieving Factors: Tramadol  helps with the pain. Effect of Pain on Daily Activities: Effects walking.  Pain Relieving Factors: Tramadol  helps with the pain.  BMI - recorded: 39.61 Nutritional Status: BMI > 30  Obese Nutritional Risks: None Diabetes: No  How often do you need to have someone help you when you read instructions, pamphlets, or other written materials from your doctor or pharmacy?: 1 - Never What is the last grade level you completed in school?: 13  Interpreter Needed?: No      Activities of Daily Living    08/13/2023    8:07 AM 10/25/2022    6:39 AM  In your present state of health, do you have any difficulty performing the following activities:  Hearing? 1 0  Comment Some difficulty hearing   Vision? 0 0  Difficulty concentrating or making decisions? 0 0  Walking or climbing stairs? 1 0  Comment Had foot surgery/ in a boot   Dressing or bathing? 0 0  Doing errands, shopping? 0   Preparing Food and eating ? N   Using the Toilet? N   In the past six months, have you accidently leaked urine? N   Do you have problems with loss of bowel control? N   Managing your Medications? N   Managing your Finances? N   Housekeeping or managing your Housekeeping? Y   Comment Foot surgery     Patient Care Team: Willo Mini, NP as PCP - General (Nurse Practitioner) Vernetta Lonni GRADE, MD as Consulting Physician (Orthopedic Surgery)  Indicate any recent Medical Services you may have received from other than [facility] providers in the past year (date may be approximate).     Assessment:  This is a routine wellness examination for Kashae.  Hearing/Vision screen Hearing Screening - Comments:: Unable to test Vision  Screening - Comments:: Unable to test   Goals Addressed             This Visit's Progress    Weight (lb) < 200 lb (90.7 kg)   238 lb (108 kg)    She would like to lose weight.       Depression Screen    08/13/2023    8:17 AM 10/18/2022    8:55 AM 08/20/2022    2:05 PM 08/10/2022    8:11 AM 04/11/2022    2:36 PM 01/24/2022    8:20 AM 11/01/2021   10:10 AM  PHQ 2/9 Scores  PHQ - 2 Score 0 0 0 0 0 0 0  PHQ- 9 Score     2      Fall Risk    08/13/2023    8:19 AM 01/30/2023    8:30 AM 10/18/2022    8:54 AM 08/20/2022    2:05 PM 08/10/2022    8:10 AM  Fall Risk   Falls in the past year? 1 0 1 1 1   Number falls in past yr: 1 0 1 1 1   Injury with Fall? 1 0 1 0 1  Risk for fall due to : History of fall(s);Impaired mobility  History of fall(s) History of fall(s) History of fall(s);Impaired mobility  Follow up Falls evaluation completed Falls evaluation completed Falls evaluation completed Falls evaluation completed Falls evaluation completed;Education provided;Falls prevention discussed    MEDICARE RISK AT HOME: Medicare Risk at Home Any stairs in or around the home?: Yes If so, are there any without handrails?: Yes Home free of loose throw rugs in walkways, pet beds, electrical cords, etc?: Yes Adequate lighting in your home to reduce risk of falls?: Yes Life alert?: No Use of a cane, walker or w/c?: No Grab bars in the bathroom?: No Shower chair or bench in shower?: No Elevated toilet seat or a handicapped toilet?: Yes  TIMED UP AND GO:  Was the test performed?  No    Cognitive Function:        08/13/2023    8:21 AM 08/10/2022    8:18 AM 08/07/2021    9:04 AM  6CIT Screen  What Year? 0 points 0 points 0 points  What month? 0 points 0 points 0 points  What time? 0 points 0 points 0 points  Count back from 20 0 points 0 points 0 points  Months in reverse 0 points 0 points 0 points  Repeat phrase 0 points 0 points 0 points  Total Score 0 points 0 points 0 points     Immunizations Immunization History  Administered Date(s) Administered   Influenza, Seasonal, Injecte, Preservative Fre 05/28/2023   Influenza,inj,Quad PF,6+ Mos 03/10/2021, 04/11/2022   Influenza,inj,Quad PF,6-35 Mos 07/31/2018   Influenza-Unspecified 07/09/2009, 04/23/2013, 05/04/2014, 04/27/2015   Janssen (J&J) SARS-COV-2 Vaccination 02/27/2020   Tdap 07/10/2007    TDAP status: Due, Education has been provided regarding the importance of this vaccine. Advised may receive this vaccine at local pharmacy or Health Dept. Aware to provide a copy of the vaccination record if obtained from local pharmacy or Health Dept. Verbalized acceptance and understanding.  Flu Vaccine status: Up to date  Pneumococcal vaccine status: Due, Education has been provided regarding the importance of this vaccine. Advised may receive this vaccine at local pharmacy or Health Dept. Aware to provide a copy of the vaccination record  if obtained from local pharmacy or Health Dept. Verbalized acceptance and understanding.  Covid-19 vaccine status: Declined, Education has been provided regarding the importance of this vaccine but patient still declined. Advised may receive this vaccine at local pharmacy or Health Dept.or vaccine clinic. Aware to provide a copy of the vaccination record if obtained from local pharmacy or Health Dept. Verbalized acceptance and understanding.  Qualifies for Shingles Vaccine? Yes   Zostavax completed No   Shingrix Completed?: No.    Education has been provided regarding the importance of this vaccine. Patient has been advised to call insurance company to determine out of pocket expense if they have not yet received this vaccine. Advised may also receive vaccine at local pharmacy or Health Dept. Verbalized acceptance and understanding.  Screening Tests Health Maintenance  Topic Date Due   Pneumococcal Vaccine 108-1 Years old (1 of 2 - PCV) Never done   HIV Screening  Never done    COVID-19 Vaccine (2 - 2024-25 season) 03/10/2023   Zoster Vaccines- Shingrix (1 of 2) 08/28/2023 (Originally 01/06/2015)   DTaP/Tdap/Td (2 - Td or Tdap) 05/27/2024 (Originally 07/09/2017)   MAMMOGRAM  12/28/2023   Medicare Annual Wellness (AWV)  08/12/2024   Cervical Cancer Screening (HPV/Pap Cotest)  09/08/2026   Colonoscopy  09/06/2031   INFLUENZA VACCINE  Completed   Hepatitis C Screening  Completed   HPV VACCINES  Aged Out    Health Maintenance  Health Maintenance Due  Topic Date Due   Pneumococcal Vaccine 13-54 Years old (1 of 2 - PCV) Never done   HIV Screening  Never done   COVID-19 Vaccine (2 - 2024-25 season) 03/10/2023    Colorectal cancer screening: Type of screening: Colonoscopy. Completed 09/05/2021. Repeat every 10 years  Mammogram status: Ordered scheduled for 09/04/2023. Pt provided with contact info and advised to call to schedule appt.     Lung Cancer Screening: (Low Dose CT Chest recommended if Age 79-80 years, 20 pack-year currently smoking OR have quit w/in 15years.) does not qualify.   Lung Cancer Screening Referral: n/a  Additional Screening:  Hepatitis C Screening: does qualify; Completed 04/01/2021  Vision Screening: Recommended annual ophthalmology exams for early detection of glaucoma and other disorders of the eye. Is the patient up to date with their annual eye exam?  Yes  Who is the provider or what is the name of the office in which the patient attends annual eye exams? [facility] If pt is not established with a provider, would they like to be referred to a provider to establish care?  N/a .   Dental Screening: Recommended annual dental exams for proper oral hygiene   Community Resource Referral / Chronic Care Management: CRR required this visit?  No   CCM required this visit?  No     Plan:     I have personally reviewed and noted the following in the patient's chart:   Medical and social history Use of alcohol, tobacco  or illicit drugs  Current medications and supplements including opioid prescriptions. Patient is currently taking opioid prescriptions. Information provided to patient regarding non-opioid alternatives. Patient advised to discuss non-opioid treatment plan with their provider. Functional ability and status Nutritional status Physical activity Advanced directives List of other physicians Hospitalizations, surgeries, and ER visits in previous 12 months Vitals Screenings to include cognitive, depression, and falls Referrals and appointments  In addition, I have reviewed and discussed with patient certain preventive protocols, quality metrics, and best practice recommendations. A written  personalized care plan for preventive services as well as general preventive health recommendations were provided to patient.     Bonny Jon Mayor, CMA   08/13/2023   After Visit Summary: (MyChart) Due to this being a telephonic visit, the after visit summary with patients personalized plan was offered to patient via MyChart   Nurse Notes:   Barbara Calderon will try to get a T-dap and Shingles vaccine at the local pharmacy.  She can get the Pneumococcal if needed during her next visit.   She has a mammogram scheduled for 09/04/2023.

## 2023-09-04 ENCOUNTER — Ambulatory Visit: Payer: Medicare HMO

## 2023-09-15 ENCOUNTER — Other Ambulatory Visit: Payer: Self-pay | Admitting: Medical-Surgical

## 2023-10-02 ENCOUNTER — Other Ambulatory Visit: Payer: Self-pay

## 2023-10-02 MED ORDER — GABAPENTIN 300 MG PO CAPS: 300.0000 mg | ORAL_CAPSULE | Freq: Every day | ORAL | 0 refills | Status: DC

## 2023-10-23 ENCOUNTER — Other Ambulatory Visit: Payer: Self-pay | Admitting: Medical-Surgical

## 2023-10-23 DIAGNOSIS — M14672 Charcot's joint, left ankle and foot: Secondary | ICD-10-CM | POA: Diagnosis not present

## 2023-11-04 ENCOUNTER — Other Ambulatory Visit: Payer: Self-pay | Admitting: Medical-Surgical

## 2023-11-04 DIAGNOSIS — M19072 Primary osteoarthritis, left ankle and foot: Secondary | ICD-10-CM | POA: Diagnosis not present

## 2023-11-13 DIAGNOSIS — M14672 Charcot's joint, left ankle and foot: Secondary | ICD-10-CM | POA: Diagnosis not present

## 2023-12-09 ENCOUNTER — Other Ambulatory Visit: Payer: Self-pay | Admitting: Sports Medicine

## 2023-12-09 DIAGNOSIS — Q048 Other specified congenital malformations of brain: Secondary | ICD-10-CM | POA: Diagnosis not present

## 2023-12-09 DIAGNOSIS — J4520 Mild intermittent asthma, uncomplicated: Secondary | ICD-10-CM | POA: Diagnosis not present

## 2023-12-09 DIAGNOSIS — R002 Palpitations: Secondary | ICD-10-CM | POA: Diagnosis not present

## 2023-12-09 DIAGNOSIS — Z6841 Body Mass Index (BMI) 40.0 and over, adult: Secondary | ICD-10-CM | POA: Diagnosis not present

## 2023-12-09 DIAGNOSIS — R2681 Unsteadiness on feet: Secondary | ICD-10-CM | POA: Diagnosis not present

## 2023-12-09 DIAGNOSIS — E039 Hypothyroidism, unspecified: Secondary | ICD-10-CM | POA: Diagnosis not present

## 2023-12-09 DIAGNOSIS — Z008 Encounter for other general examination: Secondary | ICD-10-CM | POA: Diagnosis not present

## 2023-12-09 DIAGNOSIS — F324 Major depressive disorder, single episode, in partial remission: Secondary | ICD-10-CM | POA: Diagnosis not present

## 2023-12-09 DIAGNOSIS — I890 Lymphedema, not elsewhere classified: Secondary | ICD-10-CM | POA: Diagnosis not present

## 2023-12-09 DIAGNOSIS — M19072 Primary osteoarthritis, left ankle and foot: Secondary | ICD-10-CM

## 2023-12-16 ENCOUNTER — Other Ambulatory Visit: Payer: Self-pay | Admitting: Sports Medicine

## 2023-12-16 DIAGNOSIS — M19072 Primary osteoarthritis, left ankle and foot: Secondary | ICD-10-CM

## 2023-12-24 ENCOUNTER — Other Ambulatory Visit: Payer: Self-pay | Admitting: Medical-Surgical

## 2024-01-21 ENCOUNTER — Other Ambulatory Visit: Payer: Self-pay | Admitting: Medical-Surgical

## 2024-03-10 ENCOUNTER — Encounter: Payer: Self-pay | Admitting: Sports Medicine

## 2024-04-01 ENCOUNTER — Encounter: Payer: Self-pay | Admitting: Medical-Surgical

## 2024-04-01 ENCOUNTER — Ambulatory Visit (INDEPENDENT_AMBULATORY_CARE_PROVIDER_SITE_OTHER): Admitting: Medical-Surgical

## 2024-04-01 VITALS — BP 113/78 | HR 49 | Resp 20 | Ht 65.0 in

## 2024-04-01 DIAGNOSIS — Z0000 Encounter for general adult medical examination without abnormal findings: Secondary | ICD-10-CM | POA: Diagnosis not present

## 2024-04-01 DIAGNOSIS — M19072 Primary osteoarthritis, left ankle and foot: Secondary | ICD-10-CM

## 2024-04-01 DIAGNOSIS — E782 Mixed hyperlipidemia: Secondary | ICD-10-CM | POA: Diagnosis not present

## 2024-04-01 DIAGNOSIS — E063 Autoimmune thyroiditis: Secondary | ICD-10-CM | POA: Insufficient documentation

## 2024-04-01 MED ORDER — TIRZEPATIDE 10 MG/0.5ML ~~LOC~~ SOAJ: SUBCUTANEOUS | 33 refills | Status: AC

## 2024-04-01 MED ORDER — TRAMADOL HCL 50 MG PO TABS: 50.0000 mg | ORAL_TABLET | Freq: Two times a day (BID) | ORAL | 3 refills | Status: AC | PRN

## 2024-04-01 NOTE — Assessment & Plan Note (Signed)
 History of elevated cholesterol however not currently on medication for management.  Working on healthy dietary intake. - Checking lipids.

## 2024-04-01 NOTE — Assessment & Plan Note (Signed)
 Dry eyes and dry mouth possibly related to Hashimoto's disease. Currently managed with Synthroid  112mcg daily. - Evaluate thyroid  function. - Continue Synthroid , possible dose titration depending on lab results.

## 2024-04-01 NOTE — Assessment & Plan Note (Signed)
 Currently seeing podiatry for management of left ankle osteoarthritis and Charcot foot.  Previously followed by Dr. ONEIDA for management of pain using tramadol .  Only uses this for severe pain and infrequently.  Requesting refills today. - Continue tramadol  twice daily as needed for severe pain.

## 2024-04-01 NOTE — Progress Notes (Signed)
 Complete physical exam  Patient: Barbara Calderon   DOB: 02-27-1966   58 y.o. Female  MRN: [id number]  Subjective:    Chief Complaint  Patient presents with   Annual Exam    Barbara Calderon is a 58 y.o. female who presents today for a complete physical exam. She reports consuming a general diet. Exercise is limited by orthopedic condition(s): left foot issues. She generally feels well. She reports sleeping fairly well. She does not have additional problems to discuss today.    Most recent fall risk assessment:    08/13/2023    8:19 AM  Fall Risk   Falls in the past year? 1  Number falls in past yr: 1  Injury with Fall? 1  Risk for fall due to : History of fall(s);Impaired mobility  Follow up Falls evaluation completed     Most recent depression screenings:    04/01/2024    9:20 AM 08/13/2023    8:17 AM  PHQ 2/9 Scores  PHQ - 2 Score 0 0  PHQ- 9 Score 4     Vision:Within last year and Dental: No current dental problems and Receives regular dental care    Patient Care Team: Willo Mini, NP as PCP - General (Nurse Practitioner) Vernetta Lonni GRADE, MD as Consulting Physician (Orthopedic Surgery)   Outpatient Medications Prior to Visit  Medication Sig   albuterol  (VENTOLIN  HFA) 108 (90 Base) MCG/ACT inhaler USE 2 INHALATIONS ORALLY   EVERY 4 HOURS AS NEEDED FORWHEEZING OR SHORTNESS OF   BREATH   AMBULATORY NON FORMULARY MEDICATION Knee-high, medium compression, graduated compression stockings. Apply to lower extremities. [email or web address], Zippered Compression Stockings, medium circ, long length   citalopram  (CELEXA ) 40 MG tablet Take 1 tablet (40 mg total) by mouth daily.   furosemide  (LASIX ) 20 MG tablet Take 1 tablet (20 mg total) by mouth daily as needed for fluid or edema.   gabapentin  (NEURONTIN ) 100 MG capsule Take 1 capsule (100 mg total) by mouth 2 (two) times daily.   gabapentin  (NEURONTIN ) 300 MG capsule Take 1 capsule (300 mg total) by mouth at bedtime.  NEEDS APPOINTMENT FOR FURTHER REFILLS.   meloxicam  (MOBIC ) 15 MG tablet Take 1 tablet (15 mg total) by mouth daily.   oxybutynin  (DITROPAN -XL) 10 MG 24 hr tablet Take 1 tablet (10 mg total) by mouth at bedtime. NEEDS APPOINTMENT FOR FURTHER REFILLS.   pantoprazole  (PROTONIX ) 40 MG tablet Take 1 tablet (40 mg total) by mouth daily. NEEDS APPOINTMENT FOR FURTHER REFILLS.   potassium chloride  SA (KLOR-CON  M) 20 MEQ tablet Take 1 tablet (20 mEq total) by mouth daily as needed (ONLY ON THE DAYS OF TAKING LASIX ).   SYNTHROID  125 MCG tablet Take 1 tablet (125 mcg total) by mouth daily before breakfast.   [DISCONTINUED] diclofenac  Sodium (VOLTAREN ) 1 % GEL Apply 4 g topically 4 (four) times daily. (Patient not taking: Reported on 08/13/2023)   [DISCONTINUED] traMADol  (ULTRAM ) 50 MG tablet Take 1 tablet (50 mg total) by mouth every 12 (twelve) hours as needed.   No facility-administered medications prior to visit.    Review of Systems  Constitutional:  Negative for chills, fever, malaise/fatigue and weight loss.  HENT:  Negative for congestion, ear pain, hearing loss, sinus pain and sore throat.   Eyes:  Negative for blurred vision, photophobia and pain.  Respiratory:  Negative for cough, shortness of breath and wheezing.   Cardiovascular:  Negative for chest pain, palpitations and leg swelling.  Gastrointestinal:  Negative for abdominal pain,  constipation, diarrhea, heartburn, nausea and vomiting.  Genitourinary:  Negative for dysuria, frequency and urgency.  Musculoskeletal:  Positive for joint pain and myalgias. Negative for falls and neck pain.  Skin:  Negative for itching and rash.  Neurological:  Negative for dizziness, weakness and headaches.  Endo/Heme/Allergies:  Negative for polydipsia. Does not bruise/bleed easily.  Psychiatric/Behavioral:  Negative for depression, substance abuse and suicidal ideas. The patient is nervous/anxious.      Objective:     BP 113/78 (BP Location: Right Arm,  Cuff Size: Large)   Pulse (!) 49   Resp 20   Ht 5' 5 (1.651 m)   SpO2 96%   BMI 39.61 kg/m    Physical Exam Vitals reviewed.  Constitutional:      General: She is not in acute distress.    Appearance: Normal appearance. She is not ill-appearing.  HENT:     Head: Normocephalic and atraumatic.     Right Ear: Tympanic membrane, ear canal and external ear normal. There is no impacted cerumen.     Left Ear: Tympanic membrane, ear canal and external ear normal. There is no impacted cerumen.     Nose: Nose normal. No congestion or rhinorrhea.     Mouth/Throat:     Mouth: Mucous membranes are moist.     Pharynx: No oropharyngeal exudate or posterior oropharyngeal erythema.  Eyes:     General: No scleral icterus.       Right eye: No discharge.        Left eye: No discharge.     Extraocular Movements: Extraocular movements intact.     Conjunctiva/sclera: Conjunctivae normal.     Pupils: Pupils are equal, round, and reactive to light.  Neck:     Thyroid : No thyromegaly.     Vascular: No carotid bruit or JVD.     Trachea: Trachea normal.  Cardiovascular:     Rate and Rhythm: Normal rate and regular rhythm.     Pulses: Normal pulses.     Heart sounds: Normal heart sounds. No murmur heard.    No friction rub. No gallop.  Pulmonary:     Effort: Pulmonary effort is normal. No respiratory distress.     Breath sounds: Normal breath sounds. No wheezing.  Abdominal:     General: Bowel sounds are normal. There is no distension.     Palpations: Abdomen is soft.     Tenderness: There is no abdominal tenderness. There is no guarding.  Musculoskeletal:        General: Normal range of motion.     Cervical back: Normal range of motion and neck supple.  Lymphadenopathy:     Cervical: No cervical adenopathy.  Skin:    General: Skin is warm and dry.  Neurological:     Mental Status: She is alert and oriented to person, place, and time.     Cranial Nerves: No cranial nerve deficit.   Psychiatric:        Mood and Affect: Mood normal.        Behavior: Behavior normal.        Thought Content: Thought content normal.        Judgment: Judgment normal.      No results found for any visits on 04/01/24.     Assessment & Plan:    Routine Health Maintenance and Physical Exam  Immunization History  Administered Date(s) Administered   Influenza, Seasonal, Injecte, Preservative Fre 05/28/2023   Influenza,inj,Quad PF,6+ Mos 03/10/2021, 04/11/2022   Influenza,inj,Quad PF,6-35  Mos 07/31/2018   Influenza-Unspecified 07/09/2009, 04/23/2013, 05/04/2014, 04/27/2015, 03/25/2024   Janssen (J&J) SARS-COV-2 Vaccination 02/27/2020   Tdap 07/10/2007    Health Maintenance  Topic Date Due   HIV Screening  Never done   Pneumococcal Vaccine: 50+ Years (1 of 2 - PCV) Never done   Hepatitis B Vaccines 19-59 Average Risk (1 of 3 - 19+ 3-dose series) Never done   Zoster Vaccines- Shingrix (1 of 2) Never done   Mammogram  12/28/2023   COVID-19 Vaccine (2 - 2025-26 season) 04/17/2024 (Originally 03/09/2024)   DTaP/Tdap/Td (2 - Td or Tdap) 05/27/2024 (Originally 07/09/2017)   Medicare Annual Wellness (AWV)  08/12/2024   Cervical Cancer Screening (HPV/Pap Cotest)  09/08/2026   Colonoscopy  09/06/2031   Influenza Vaccine  Completed   Hepatitis C Screening  Completed   HPV VACCINES  Aged Out   Meningococcal B Vaccine  Aged Out    Discussed health benefits of physical activity, and encouraged her to engage in regular exercise appropriate for her age and condition.  Annual physical exam Checking labs as below. UTD on preventative care. Wellness information provided with AVS. - CBC with Differential/Platelet - CMP14+EGFR  Problem List Items Addressed This Visit       Endocrine   Hypothyroidism due to Hashimoto's thyroiditis   Dry eyes and dry mouth possibly related to Hashimoto's disease. Currently managed with Synthroid  112mcg daily. - Evaluate thyroid  function. - Continue  Synthroid , possible dose titration depending on lab results.      Relevant Orders   TSH     Musculoskeletal and Integument   Primary osteoarthritis of left ankle   Currently seeing podiatry for management of left ankle osteoarthritis and Charcot foot.  Previously followed by Dr. ONEIDA for management of pain using tramadol .  Only uses this for severe pain and infrequently.  Requesting refills today. - Continue tramadol  twice daily as needed for severe pain.      Relevant Medications   traMADol  (ULTRAM ) 50 MG tablet     Other   Mixed hyperlipidemia - Primary   History of elevated cholesterol however not currently on medication for management.  Working on healthy dietary intake. - Checking lipids.      Relevant Orders   Lipid panel   Obesity, morbid, BMI 40.0-49.9 (HCC)   Recent weight gain due to inactivity related to orthopedic concerns.  Working to eat healthy and aim for smaller portions but no success with weight loss.  Used phentermine  in the past. - Discussed the limitations of phentermine  and the short-term duration. - Reviewed other options and recommendations. - Starting compounded tirzepatide  with up titration of dose every 4 weeks to maximum tolerated. - Increase activity as tolerated. - Follow a low fat heart healthy diet with high-protein and low calorie.      Relevant Medications   tirzepatide  (MOUNJARO ) 10 MG/0.5ML Pen   Other Relevant Orders   Hemoglobin A1c   Other Visit Diagnoses       Annual physical exam       Relevant Orders   CBC with Differential/Platelet   CMP14+EGFR      Return in about 6 months (around 09/29/2024) for chronic disease follow up.     Elyanna Wallick, NP

## 2024-04-01 NOTE — Patient Instructions (Signed)
 Preventive Care 59-59 Years Old, Female  Preventive care refers to lifestyle choices and visits with your health care provider that can promote health and wellness. Preventive care visits are also called wellness exams.  What can I expect for my preventive care visit?  Counseling  Your health care provider may ask you questions about your:  Medical history, including:  Past medical problems.  Family medical history.  Pregnancy history.  Current health, including:  Menstrual cycle.  Method of birth control.  Emotional well-being.  Home life and relationship well-being.  Sexual activity and sexual health.  Lifestyle, including:  Alcohol, nicotine or tobacco, and drug use.  Access to firearms.  Diet, exercise, and sleep habits.  Work and work Astronomer.  Sunscreen use.  Safety issues such as seatbelt and bike helmet use.  Physical exam  Your health care provider will check your:  Height and weight. These may be used to calculate your BMI (body mass index). BMI is a measurement that tells if you are at a healthy weight.  Waist circumference. This measures the distance around your waistline. This measurement also tells if you are at a healthy weight and may help predict your risk of certain diseases, such as type 2 diabetes and high blood pressure.  Heart rate and blood pressure.  Body temperature.  Skin for abnormal spots.  What immunizations do I need?    Vaccines are usually given at various ages, according to a schedule. Your health care provider will recommend vaccines for you based on your age, medical history, and lifestyle or other factors, such as travel or where you work.  What tests do I need?  Screening  Your health care provider may recommend screening tests for certain conditions. This may include:  Lipid and cholesterol levels.  Diabetes screening. This is done by checking your blood sugar (glucose) after you have not eaten for a while (fasting).  Pelvic exam and Pap test.  Hepatitis B test.  Hepatitis C  test.  HIV (human immunodeficiency virus) test.  STI (sexually transmitted infection) testing, if you are at risk.  Lung cancer screening.  Colorectal cancer screening.  Mammogram. Talk with your health care provider about when you should start having regular mammograms. This may depend on whether you have a family history of breast cancer.  BRCA-related cancer screening. This may be done if you have a family history of breast, ovarian, tubal, or peritoneal cancers.  Bone density scan. This is done to screen for osteoporosis.  Talk with your health care provider about your test results, treatment options, and if necessary, the need for more tests.  Follow these instructions at home:  Eating and drinking    Eat a diet that includes fresh fruits and vegetables, whole grains, lean protein, and low-fat dairy products.  Take vitamin and mineral supplements as recommended by your health care provider.  Do not drink alcohol if:  Your health care provider tells you not to drink.  You are pregnant, may be pregnant, or are planning to become pregnant.  If you drink alcohol:  Limit how much you have to 0-1 drink a day.  Know how much alcohol is in your drink. In the [place] one drink equals one 12 oz bottle of beer (355 mL), one 5 oz glass of wine (148 mL), or one 1 oz glass of hard liquor (44 mL).  Lifestyle  Brush your teeth every morning and night with fluoride toothpaste. Floss one time each day.  Exercise for at least  30 minutes 5 or more days each week.  Do not use any products that contain nicotine or tobacco. These products include cigarettes, chewing tobacco, and vaping devices, such as e-cigarettes. If you need help quitting, ask your health care provider.  Do not use drugs.  If you are sexually active, practice safe sex. Use a condom or other form of protection to prevent STIs.  If you do not wish to become pregnant, use a form of birth control. If you plan to become pregnant, see your health care provider for a  prepregnancy visit.  Take aspirin only as told by your health care provider. Make sure that you understand how much to take and what form to take. Work with your health care provider to find out whether it is safe and beneficial for you to take aspirin daily.  Find healthy ways to manage stress, such as:  Meditation, yoga, or listening to music.  Journaling.  Talking to a trusted person.  Spending time with friends and family.  Minimize exposure to UV radiation to reduce your risk of skin cancer.  Safety  Always wear your seat belt while driving or riding in a vehicle.  Do not drive:  If you have been drinking alcohol. Do not ride with someone who has been drinking.  When you are tired or distracted.  While texting.  If you have been using any mind-altering substances or drugs.  Wear a helmet and other protective equipment during sports activities.  If you have firearms in your house, make sure you follow all gun safety procedures.  Seek help if you have been physically or sexually abused.  What's next?  Visit your health care provider once a year for an annual wellness visit.  Ask your health care provider how often you should have your eyes and teeth checked.  Stay up to date on all vaccines.  This information is not intended to replace advice given to you by your health care provider. Make sure you discuss any questions you have with your health care provider.  Document Revised: 12/21/2020 Document Reviewed: 12/21/2020  Elsevier Patient Education  2024 [employer].

## 2024-04-01 NOTE — Assessment & Plan Note (Signed)
 Recent weight gain due to inactivity related to orthopedic concerns.  Working to eat healthy and aim for smaller portions but no success with weight loss.  Used phentermine  in the past. - Discussed the limitations of phentermine  and the short-term duration. - Reviewed other options and recommendations. - Starting compounded tirzepatide  with up titration of dose every 4 weeks to maximum tolerated. - Increase activity as tolerated. - Follow a low fat heart healthy diet with high-protein and low calorie.

## 2024-04-02 ENCOUNTER — Encounter: Payer: Self-pay | Admitting: Medical-Surgical

## 2024-04-02 ENCOUNTER — Other Ambulatory Visit: Payer: Self-pay | Admitting: Medical-Surgical

## 2024-04-02 ENCOUNTER — Ambulatory Visit: Payer: Self-pay | Admitting: Medical-Surgical

## 2024-04-02 ENCOUNTER — Encounter (INDEPENDENT_AMBULATORY_CARE_PROVIDER_SITE_OTHER): Payer: Self-pay

## 2024-04-02 DIAGNOSIS — M19072 Primary osteoarthritis, left ankle and foot: Secondary | ICD-10-CM

## 2024-04-02 LAB — CMP14+EGFR
ALT: 13 IU/L (ref 0–32)
AST: 14 IU/L (ref 0–40)
Albumin: 4.5 g/dL (ref 3.8–4.9)
Alkaline Phosphatase: 81 IU/L (ref 49–135)
BUN/Creatinine Ratio: 26 — ABNORMAL HIGH (ref 9–23)
BUN: 21 mg/dL (ref 6–24)
Bilirubin Total: 0.5 mg/dL (ref 0.0–1.2)
CO2: 21 mmol/L (ref 20–29)
Calcium: 9.6 mg/dL (ref 8.7–10.2)
Chloride: 102 mmol/L (ref 96–106)
Creatinine, Ser: 0.81 mg/dL (ref 0.57–1.00)
Globulin, Total: 2.3 g/dL (ref 1.5–4.5)
Glucose: 90 mg/dL (ref 70–99)
Potassium: 4.5 mmol/L (ref 3.5–5.2)
Sodium: 141 mmol/L (ref 134–144)
Total Protein: 6.8 g/dL (ref 6.0–8.5)
eGFR: 84 mL/min/1.73 (ref >59)

## 2024-04-02 LAB — CBC WITH DIFFERENTIAL/PLATELET
Basophils Absolute: 0 x10E3/uL (ref 0.0–0.2)
Basos: 1 %
EOS (ABSOLUTE): 0.2 x10E3/uL (ref 0.0–0.4)
Eos: 4 %
Hematocrit: 43.8 % (ref 34.0–46.6)
Hemoglobin: 14.3 g/dL (ref 11.1–15.9)
Immature Grans (Abs): 0 x10E3/uL (ref 0.0–0.1)
Immature Granulocytes: 0 %
Lymphocytes Absolute: 1.4 x10E3/uL (ref 0.7–3.1)
Lymphs: 28 %
MCH: 30.8 pg (ref 26.6–33.0)
MCHC: 32.6 g/dL (ref 31.5–35.7)
MCV: 94 fL (ref 79–97)
Monocytes Absolute: 0.5 x10E3/uL (ref 0.1–0.9)
Monocytes: 9 %
Neutrophils Absolute: 2.8 x10E3/uL (ref 1.4–7.0)
Neutrophils: 58 %
Platelets: 242 x10E3/uL (ref 150–450)
RBC: 4.65 x10E6/uL (ref 3.77–5.28)
RDW: 13.1 % (ref 11.7–15.4)
WBC: 4.8 x10E3/uL (ref 3.4–10.8)

## 2024-04-02 LAB — LIPID PANEL
Chol/HDL Ratio: 3.2 ratio (ref 0.0–4.4)
Cholesterol, Total: 223 mg/dL — ABNORMAL HIGH (ref 100–199)
HDL: 70 mg/dL (ref >39)
LDL Chol Calc (NIH): 140 mg/dL — ABNORMAL HIGH (ref 0–99)
Triglycerides: 75 mg/dL (ref 0–149)
VLDL Cholesterol Cal: 13 mg/dL (ref 5–40)

## 2024-04-02 LAB — HEMOGLOBIN A1C
Est. average glucose Bld gHb Est-mCnc: 117 mg/dL
Hgb A1c MFr Bld: 5.7 % — ABNORMAL HIGH (ref 4.8–5.6)

## 2024-04-02 LAB — TSH: TSH: 2.15 u[IU]/mL (ref 0.450–4.500)

## 2024-04-02 MED ORDER — PANTOPRAZOLE SODIUM 40 MG PO TBEC: 40.0000 mg | DELAYED_RELEASE_TABLET | Freq: Every day | ORAL | 3 refills | Status: DC

## 2024-04-02 MED ORDER — OXYBUTYNIN CHLORIDE ER 10 MG PO TB24: 10.0000 mg | ORAL_TABLET | Freq: Every day | ORAL | 3 refills | Status: DC

## 2024-04-02 MED ORDER — SYNTHROID 125 MCG PO TABS: 125.0000 ug | ORAL_TABLET | Freq: Every day | ORAL | 1 refills | Status: DC

## 2024-04-02 MED ORDER — ALBUTEROL SULFATE HFA 108 (90 BASE) MCG/ACT IN AERS: 2.0000 | INHALATION_SPRAY | RESPIRATORY_TRACT | 1 refills | Status: AC | PRN

## 2024-04-02 MED ORDER — MELOXICAM 15 MG PO TABS
15.0000 mg | ORAL_TABLET | Freq: Every day | ORAL | 3 refills | Status: AC
Start: 2024-04-02 — End: ?

## 2024-04-02 MED ORDER — CITALOPRAM HYDROBROMIDE 40 MG PO TABS: 40.0000 mg | ORAL_TABLET | Freq: Every day | ORAL | 3 refills | Status: AC

## 2024-04-02 NOTE — Telephone Encounter (Signed)
 Requesting rx rf of all medications except potassium , gabapentin  and lasix    Albuterol  HFA - last written 12/24/2023 Citalopram  40mg  last written 01/132025 Meloxicam  15mg  last written 07/23/2023 Oxybutinin 10mg  last written 01/22/2024 Protonix  40mg  last written 01/22/2024  Last OV 04/01/2024 Upcoming appt 09/29/2024

## 2024-04-13 DIAGNOSIS — M14672 Charcot's joint, left ankle and foot: Secondary | ICD-10-CM | POA: Diagnosis not present

## 2024-04-15 ENCOUNTER — Other Ambulatory Visit: Payer: Self-pay | Admitting: Medical-Surgical

## 2024-04-15 ENCOUNTER — Telehealth: Payer: Self-pay | Admitting: Medical-Surgical

## 2024-04-15 NOTE — Telephone Encounter (Unsigned)
 Copied from CRM #[id number]. Topic: Clinical - Medication Refill >> Apr 15, 2024  4:02 PM Amy B wrote: Medication: albuterol  (VENTOLIN  HFA) 108 (90 Base) MCG/ACT inhaler  Has the patient contacted their pharmacy? Yes (Agent: If no, request that the patient contact the pharmacy for the refill. If patient does not wish to contact the pharmacy document the reason why and proceed with request.) (Agent: If yes, when and what did the pharmacy advise?)  This is the patient's preferred pharmacy:   [facility] - [place], [place] - [facility] AT [facility] [address] [place] [address] Phone: [phone] Fax: [phone] option 2 Ref# [id number]  Is this the correct pharmacy for this prescription? Yes If no, delete pharmacy and type the correct one.   Has the prescription been filled recently? No  Is the patient out of the medication? Yes  Has the patient been seen for an appointment in the last year OR does the patient have an upcoming appointment? Yes  Can we respond through MyChart? Yes  Agent: Please be advised that Rx refills may take up to 3 business days. We ask that you follow-up with your pharmacy.

## 2024-04-20 ENCOUNTER — Other Ambulatory Visit: Payer: Self-pay | Admitting: Medical-Surgical

## 2024-05-11 ENCOUNTER — Encounter: Payer: Self-pay | Admitting: Radiology

## 2024-06-21 ENCOUNTER — Other Ambulatory Visit: Payer: Self-pay | Admitting: Medical-Surgical

## 2024-06-22 MED ORDER — GABAPENTIN 300 MG PO CAPS: 300.0000 mg | ORAL_CAPSULE | Freq: Every day | ORAL | 0 refills | Status: AC

## 2024-07-13 ENCOUNTER — Ambulatory Visit: Payer: Self-pay

## 2024-07-13 VITALS — BP 121/61 | HR 62 | Temp 98.2°F | Ht 65.0 in | Wt 238.0 lb

## 2024-07-13 DIAGNOSIS — L03115 Cellulitis of right lower limb: Secondary | ICD-10-CM | POA: Diagnosis not present

## 2024-07-13 DIAGNOSIS — I890 Lymphedema, not elsewhere classified: Secondary | ICD-10-CM

## 2024-07-13 MED ORDER — DOXYCYCLINE HYCLATE 100 MG PO TABS: 100.0000 mg | ORAL_TABLET | Freq: Two times a day (BID) | ORAL | 0 refills | Status: AC

## 2024-07-13 NOTE — Patient Instructions (Signed)
 Cellulitis, Adult    Cellulitis is a skin infection. The infected area is often warm, red, swollen, and sore. It occurs most often on the legs, feet, and toes, but can happen on any part of the body.  This condition can be life-threatening without treatment. It is very important to get treated right away.  What are the causes?  This condition is caused by bacteria. The bacteria enter through a break in the skin, such as:  A cut.  A burn.  A bug bite.  An animal bite.  An open sore.  A crack.  What increases the risk?  Having a weak body's defense system (immune system).  Being older than 60 years old.  Having a blood sugar problem (diabetes).  Having a long-term liver disease (cirrhosis) or kidney disease.  Being very overweight (obese).  Having a skin problem, such as:  An itchy rash.  A rash caused by a fungus.  A rash with blisters.  Slow movement of blood in the veins (venous stasis).  Fluid buildup below the skin (edema).  This condition is more likely to occur in people who:  Have open cuts, burns, bites, or scrapes on the skin.  Have been treated with high-energy rays (radiation).  Use IV drugs.  What are the signs or symptoms?  Skin that:  Looks red or purple, or slightly darker than your usual skin color.  Has streaks.  Has spots.  Is swollen.  Is sore or painful when you touch it.  Is warm.  A fever.  Chills.  Blisters.  Tiredness (fatigue).  How is this treated?  Medicines to treat infections or allergies.  Rest.  Placing cold or warm cloths on the skin.  Staying in the hospital, if the condition is very bad. You may need medicines through an IV.  Follow these instructions at home:  Medicines  Take over-the-counter and prescription medicines only as told by your doctor.  If you were prescribed antibiotics, take them as told by your doctor. Do not stop using them even if you start to feel better.  General instructions  Drink enough fluid to keep your pee (urine) pale yellow.  Do not touch or rub the  infected area.  Raise (elevate) the infected area above the level of your heart while you are sitting or lying down.  Return to your normal activities when your doctor says that it is safe.  Place cold or warm cloths on the area as told by your doctor.  Keep all follow-up visits. Your doctor will need to make sure that a more serious infection is not developing.  Contact a doctor if:  You have a fever.  You do not start to get better after 1-2 days of treatment.  Your bone or joint under the infected area starts to hurt after the skin has healed.  Your infection comes back in the same area or another area. Signs of this may include:  You have a swollen bump in the area.  Your red area gets larger, turns dark in color, or hurts more.  You have more fluid coming from the wound.  Pus or a bad smell develops in your infected area.  You have more pain.  You feel sick and have muscle aches and weakness.  You develop vomiting or watery poop that will not go away.  Get help right away if:  You see red streaks coming from the area.  You notice the skin turns purple or black and falls  off.  These symptoms may be an emergency. Get help right away. Call 911.  Do not wait to see if the symptoms will go away.  Do not drive yourself to the hospital.  This information is not intended to replace advice given to you by your health care provider. Make sure you discuss any questions you have with your health care provider.  Document Revised: 02/20/2022 Document Reviewed: 02/20/2022  Elsevier Patient Education  2024 [employer].

## 2024-07-13 NOTE — Telephone Encounter (Signed)
 FYI Only or Action Required?: FYI only for provider: Patient going to UC.  Patient was last seen in primary care on 04/01/2024 by Willo Mini, NP.  Called Nurse Triage reporting Leg Swelling.  Symptoms began several days ago.  Interventions attempted: Nothing.  Symptoms are: unchanged.  Triage Disposition: See HCP Within 4 Hours (Or PCP Triage)  Patient/caregiver understands and will follow disposition?: Yes Reason for Disposition  [1] Red area or streak [2] large (> 2 inches or 5 cm)  Answer Assessment - Initial Assessment Questions Lymphedema in both legs, R leg had knee replacement years ago. Hx of cellulitis in right leg. Taken Tylenol . Patient denied scheduling today as open appointments conflicted with personal schedule. Patient stated she will go to UC maybe. Educated importance on being seen today, patient verbalized understanding.   1. ONSET: When did the swelling start? (e.g., minutes, hours, days)     3 days ago  2. LOCATION: What part of the leg is swollen?  Are both legs swollen or just one leg?     Right leg  3. SEVERITY: How bad is the swelling? (e.g., localized; mild, moderate, severe)     Worse than what baseline swelling usually is, worse at nice.  4. REDNESS: Is there redness or signs of infection?     2-3 inches area   5. PAIN: Is the swelling painful to touch? If Yes, ask: How painful is it?   (Scale 1-10; mild, moderate or severe)     Painful to touch, and some warmth coming from the area.   6. FEVER: Do you have a fever? If Yes, ask: What is it, how was it measured, and when did it start?      Denies  7. CAUSE: What do you think is causing the leg swelling?     Cellulitis  8. MEDICAL HISTORY: Do you have a history of blood clots (e.g., DVT), cancer, heart failure, kidney disease, or liver failure?     Denies  9. RECURRENT SYMPTOM: Have you had leg swelling before? If Yes, ask: When was the last time? What happened that  time?     Yes, hx of cellulitis  10. OTHER SYMPTOMS: Do you have any other symptoms? (e.g., chest pain, difficulty breathing)       Denies  Protocols used: Leg Swelling and Edema-A-AH  Copied from CRM #[id number]. Topic: Clinical - Red Word Triage >> Jul 13, 2024 10:52 AM Tobias CROME wrote: Red Word that prompted transfer to Nurse Triage: redness, swelling, pain in right leg  Patient holding 9 min, had to go due to appointment. Requesting callback within to an hour

## 2024-07-13 NOTE — Telephone Encounter (Signed)
 Patient scheduled for visit today at 3pm with Jade Breeback, PA

## 2024-07-14 ENCOUNTER — Encounter: Payer: Self-pay | Admitting: Physician Assistant

## 2024-07-14 NOTE — Progress Notes (Signed)
" ° °  Acute Office Visit  Subjective:     Patient ID: Barbara Calderon, female    DOB: 23-Jun-1966, 59 y.o.   MRN: [id number]   HPI .Discussed the use of AI scribe software for clinical note transcription with the patient, who gave verbal consent to proceed.  History of Present Illness Barbara Calderon is a 59 year old female with lymphedema of bilateral lower extremities and right leg redness, warmth, pain of lower leg for the last few days.   Right Lower extremity swelling and erythema - Swelling and redness of the foot, more pronounced in the morning and becomes massively swollen by night - Redness is more noticeable in the morning - Right lower leg is tender and more painful when bearing weight - Pain and warmth in the affected area - No recent trauma or scratching of the area  Lymphedema - Chronic lymphedema in both legs - Persistent swelling since knee replacement surgery two years ago  Charcot arthopathy of left foot and surgical history - History of Charcot neuropathy of the left midfoot - Underwent midfoot surgery in April two years ago, resulting in dislocated bones - Persistent swelling since surgery  Infectious history - History of cellulitis in the affected area approximately nine or ten years ago - No known allergies to antibiotics    ROS See HPI.      Objective:    BP 121/61   Pulse 62   Temp 98.2 F (36.8 C) (Oral)   Ht 5' 5 (1.651 m)   Wt 238 lb (108 kg)   SpO2 99%   BMI 39.61 kg/m  BP Readings from Last 3 Encounters:  07/13/24 121/61  04/01/24 113/78  05/28/23 103/66   Wt Readings from Last 3 Encounters:  07/13/24 238 lb (108 kg)  08/13/23 238 lb (108 kg)  05/28/23 241 lb 9.6 oz (109.6 kg)      Physical Exam Constitutional:      Appearance: Normal appearance. She is obese.  HENT:     Head: Normocephalic.  Cardiovascular:     Rate and Rhythm: Normal rate.  Pulmonary:     Effort: Pulmonary effort is normal.  Musculoskeletal:      Comments: Bilateral lymphedema of both lower extremities with thickening of skin.  Pitting edema of bilateral ankles.   Right anterior lower skin with 4cm by 2cm area of redness, warmth, tenderness.   Neurological:     General: No focal deficit present.     Mental Status: She is alert and oriented to person, place, and time.  Psychiatric:        Mood and Affect: Mood normal.          Assessment & Plan:  .Diagnoses and all orders for this visit:  Cellulitis of right lower leg -     doxycycline  (VIBRA -TABS) 100 MG tablet; Take 1 tablet (100 mg total) by mouth 2 (two) times daily.  Lymphedema of both lower extremities   Assessment & Plan Cellulitis of right lower limb with lymphedema  Cellulitis with lymphedema. No recent surgery or trauma. Previous cellulitis episode 9-10 years ago. - Prescribed doxycycline  for 10 days. - Advised compression therapy for lymphedema. - Recommended foot elevation. - Instructed to report worsening swelling or pain.     Return if symptoms worsen or fail to improve.  Jhania Etherington, PA-C   "

## 2024-08-18 ENCOUNTER — Ambulatory Visit

## 2024-09-29 ENCOUNTER — Ambulatory Visit: Admitting: Medical-Surgical
